# Patient Record
Sex: Female | Born: 1935 | Race: White | Hispanic: No | Smoking: Never smoker
Health system: Southern US, Community
[De-identification: ages and names within clinical notes are randomized; demographics above are authoritative.]

## PROBLEM LIST (undated history)

## (undated) DIAGNOSIS — F0280 Dementia in other diseases classified elsewhere without behavioral disturbance: Secondary | ICD-10-CM

## (undated) DIAGNOSIS — E7800 Pure hypercholesterolemia, unspecified: Secondary | ICD-10-CM

## (undated) DIAGNOSIS — I10 Essential (primary) hypertension: Secondary | ICD-10-CM

## (undated) DIAGNOSIS — G309 Alzheimer's disease, unspecified: Secondary | ICD-10-CM

## (undated) DIAGNOSIS — E0590 Thyrotoxicosis, unspecified without thyrotoxic crisis or storm: Secondary | ICD-10-CM

## (undated) DIAGNOSIS — E119 Type 2 diabetes mellitus without complications: Secondary | ICD-10-CM

## (undated) HISTORY — PX: ABDOMINAL HYSTERECTOMY: SHX81

## (undated) HISTORY — PX: DILATION AND CURETTAGE, DIAGNOSTIC / THERAPEUTIC: SUR384

---

## 2004-05-13 ENCOUNTER — Ambulatory Visit: Payer: Self-pay | Admitting: Family Medicine

## 2005-06-08 ENCOUNTER — Ambulatory Visit: Payer: Self-pay | Admitting: Nurse Practitioner

## 2005-09-23 ENCOUNTER — Ambulatory Visit: Payer: Self-pay | Admitting: Ophthalmology

## 2005-09-29 ENCOUNTER — Ambulatory Visit: Payer: Self-pay | Admitting: Ophthalmology

## 2006-06-23 ENCOUNTER — Ambulatory Visit: Payer: Self-pay | Admitting: Nurse Practitioner

## 2006-11-16 ENCOUNTER — Ambulatory Visit: Payer: Self-pay | Admitting: Family Medicine

## 2007-07-13 ENCOUNTER — Ambulatory Visit: Payer: Self-pay | Admitting: Family Medicine

## 2008-05-08 ENCOUNTER — Ambulatory Visit: Payer: Self-pay | Admitting: Unknown Physician Specialty

## 2008-06-12 ENCOUNTER — Ambulatory Visit: Payer: Self-pay | Admitting: Unknown Physician Specialty

## 2008-07-17 ENCOUNTER — Ambulatory Visit: Payer: Self-pay | Admitting: Family Medicine

## 2008-08-07 ENCOUNTER — Ambulatory Visit: Payer: Self-pay | Admitting: Orthopedic Surgery

## 2008-08-21 ENCOUNTER — Ambulatory Visit: Payer: Self-pay | Admitting: Pain Medicine

## 2008-09-05 ENCOUNTER — Ambulatory Visit: Payer: Self-pay | Admitting: Physician Assistant

## 2008-09-18 ENCOUNTER — Ambulatory Visit: Payer: Self-pay | Admitting: Pain Medicine

## 2008-10-02 ENCOUNTER — Ambulatory Visit: Payer: Self-pay | Admitting: Physician Assistant

## 2009-08-01 ENCOUNTER — Ambulatory Visit: Payer: Self-pay | Admitting: Ophthalmology

## 2009-08-12 ENCOUNTER — Ambulatory Visit: Payer: Self-pay | Admitting: Ophthalmology

## 2009-08-28 ENCOUNTER — Ambulatory Visit: Payer: Self-pay | Admitting: Family Medicine

## 2011-02-02 ENCOUNTER — Ambulatory Visit: Payer: Self-pay | Admitting: Obstetrics & Gynecology

## 2011-02-02 LAB — CBC
HGB: 14.9 g/dL (ref 12.0–16.0)
MCH: 31.1 pg (ref 26.0–34.0)
MCHC: 34 g/dL (ref 32.0–36.0)
Platelet: 189 10*3/uL (ref 150–440)
RDW: 14.1 % (ref 11.5–14.5)

## 2011-02-02 LAB — APTT: Activated PTT: 40.5 secs — ABNORMAL HIGH (ref 23.6–35.9)

## 2011-02-02 LAB — BASIC METABOLIC PANEL
Anion Gap: 14 (ref 7–16)
BUN: 10 mg/dL (ref 7–18)
Calcium, Total: 10.4 mg/dL — ABNORMAL HIGH (ref 8.5–10.1)
Chloride: 96 mmol/L — ABNORMAL LOW (ref 98–107)
Co2: 26 mmol/L (ref 21–32)
Creatinine: 0.86 mg/dL (ref 0.60–1.30)
EGFR (African American): >60
EGFR (Non-African Amer.): >60
Glucose: 152 mg/dL — ABNORMAL HIGH (ref 65–99)
Osmolality: 274 (ref 275–301)
Potassium: 4 mmol/L (ref 3.5–5.1)
Sodium: 136 mmol/L (ref 136–145)

## 2011-02-02 LAB — PROTIME-INR
INR: 1
Prothrombin Time: 13.2 secs (ref 11.5–14.7)

## 2011-02-10 ENCOUNTER — Ambulatory Visit: Payer: Self-pay | Admitting: Obstetrics & Gynecology

## 2011-02-16 LAB — PATHOLOGY REPORT

## 2011-02-17 ENCOUNTER — Ambulatory Visit: Payer: Self-pay | Admitting: Gynecologic Oncology

## 2011-03-13 ENCOUNTER — Ambulatory Visit: Payer: Self-pay | Admitting: Gynecologic Oncology

## 2011-03-31 ENCOUNTER — Ambulatory Visit: Payer: Self-pay | Admitting: Obstetrics & Gynecology

## 2011-03-31 LAB — BASIC METABOLIC PANEL
Calcium, Total: 10.1 mg/dL (ref 8.5–10.1)
Co2: 26 mmol/L (ref 21–32)
Creatinine: 0.77 mg/dL (ref 0.60–1.30)
EGFR (African American): >60
Osmolality: 276 (ref 275–301)
Potassium: 4 mmol/L (ref 3.5–5.1)
Sodium: 136 mmol/L (ref 136–145)

## 2011-03-31 LAB — PROTIME-INR
INR: 1
Prothrombin Time: 13.1 secs (ref 11.5–14.7)

## 2011-03-31 LAB — CBC
HCT: 42.3 % (ref 35.0–47.0)
MCH: 30.4 pg (ref 26.0–34.0)
Platelet: 220 10*3/uL (ref 150–440)
RDW: 13.3 % (ref 11.5–14.5)

## 2011-04-07 ENCOUNTER — Inpatient Hospital Stay: Payer: Self-pay

## 2011-04-08 LAB — CREATININE, SERUM: EGFR (African American): >60

## 2011-04-10 DIAGNOSIS — R000 Tachycardia, unspecified: Secondary | ICD-10-CM

## 2011-04-10 LAB — CBC WITH DIFFERENTIAL/PLATELET
Basophil #: 0.1 10*3/uL (ref 0.0–0.1)
Basophil %: 0.4 %
HCT: 37.9 % (ref 35.0–47.0)
HGB: 12.6 g/dL (ref 12.0–16.0)
MCHC: 33.2 g/dL (ref 32.0–36.0)
Platelet: 234 10*3/uL (ref 150–440)
RBC: 4.17 10*6/uL (ref 3.80–5.20)

## 2011-04-10 LAB — BASIC METABOLIC PANEL
Anion Gap: 10 (ref 7–16)
Calcium, Total: 9.6 mg/dL (ref 8.5–10.1)
Chloride: 97 mmol/L — ABNORMAL LOW (ref 98–107)
Co2: 28 mmol/L (ref 21–32)
EGFR (African American): >60
Potassium: 3.9 mmol/L (ref 3.5–5.1)
Sodium: 135 mmol/L — ABNORMAL LOW (ref 136–145)

## 2011-04-10 LAB — TSH: Thyroid Stimulating Horm: 1.51 u[IU]/mL

## 2011-04-13 ENCOUNTER — Ambulatory Visit: Payer: Self-pay | Admitting: Gynecologic Oncology

## 2011-04-13 LAB — PATHOLOGY REPORT

## 2013-01-09 ENCOUNTER — Emergency Department: Payer: Self-pay | Admitting: Emergency Medicine

## 2013-01-09 LAB — BASIC METABOLIC PANEL
Anion Gap: 9 (ref 7–16)
Calcium, Total: 10.5 mg/dL — ABNORMAL HIGH (ref 8.5–10.1)
Chloride: 96 mmol/L — ABNORMAL LOW (ref 98–107)
Co2: 26 mmol/L (ref 21–32)
Creatinine: 0.76 mg/dL (ref 0.60–1.30)
Osmolality: 273 (ref 275–301)
Potassium: 3.8 mmol/L (ref 3.5–5.1)
Sodium: 131 mmol/L — ABNORMAL LOW (ref 136–145)

## 2013-01-09 LAB — CBC WITH DIFFERENTIAL/PLATELET
Basophil #: 0.1 10*3/uL (ref 0.0–0.1)
Eosinophil #: 0.2 10*3/uL (ref 0.0–0.7)
HCT: 42.5 % (ref 35.0–47.0)
HGB: 14.5 g/dL (ref 12.0–16.0)
Lymphocyte #: 2.5 10*3/uL (ref 1.0–3.6)
Lymphocyte %: 28.9 %
MCH: 31.2 pg (ref 26.0–34.0)
MCV: 91 fL (ref 80–100)
Monocyte #: 0.7 x10 3/mm (ref 0.2–0.9)
Monocyte %: 8.3 %
Platelet: 212 10*3/uL (ref 150–440)

## 2013-01-09 LAB — URINALYSIS, COMPLETE
Bilirubin,UR: NEGATIVE
Ketone: NEGATIVE
Nitrite: NEGATIVE
Specific Gravity: 1.016 (ref 1.003–1.030)
Squamous Epithelial: NONE SEEN

## 2013-01-09 LAB — TROPONIN I: Troponin-I: <0.02 ng/mL

## 2014-05-06 NOTE — Consult Note (Signed)
Brief Consult Note: Diagnosis: Post op  Radical hysterectomy with b/l Salpingo oophrectomy, now with mild hypoxia,post op  Ileus.   Patient was seen by consultant.   Consult note dictated.   Orders entered.   Comments: will get chest xr, abdominal film, check EKG , mild hypoxia could be secondary to atelectasis with abdominal ileus, cont incentive spirometry, start gentle hydration, check labs  DM cont metformin, ss coverage htn on norvasc , atenolol, pt was also on ace at home, will  restart it if renal function stable will follow.  Electronic Signatures: Fredia SorrowGupta, Shanen Norris (MD)  (Signed 29-Mar-13 09:39)  Authored: Brief Consult Note   Last Updated: 29-Mar-13 09:39 by Fredia SorrowGupta, Ingra Rother (MD)

## 2014-05-06 NOTE — Consult Note (Signed)
PATIENT NAME:  Jenna Robinson, Jenna Robinson MR#:  [id number] DATE OF BIRTH:  16-Sep-1925  DATE OF CONSULTATION:  04/10/2011  REFERRING PHYSICIAN:  Dr Tiburcio Pea.   CONSULTING PHYSICIAN:  Fredia Sorrow, MD PRIMARY CARE PHYSICIAN:  Ezzard Standing at [facility].    REASON FOR CONSULTATION: Hypertension, hypoxia, and confusion.   CHIEF COMPLAINT: "My belly is sore."  HISTORY OF PRESENT ILLNESS: An 81 year old female. She is postoperative radical hysterectomy, bilateral salpingo-oophorectomy, and pelvic lymphadenectomy for endometrial carcinoma. She has history of diabetes, hypertension, hyperlipidemia, osteoporosis. She underwent the aforementioned procedure on 04/07/2011. She is postoperative day 3. She did not have a bowel movement, and she is not passing any flatus. She is complaining of some abdominal soreness.  Also she was vomiting this morning as per the notes. She denies any nausea. She denies any chest pain or shortness of breath. She was also noted to be slightly hypoxic this morning. She was 89% on room air and tachycardic. Her heart rate has been jumping from 90 to 120. She has been put on 2L nasal cannula, and she is doing 96% to 988% on 2L nasal cannula. She denies any cough. No pleuritic chest pain. No headache but she is complaining of some dizziness. She denies any urinary complaints. She denies any oliguria. Her diet has been changed to clear liquids this morning.  hospitalist was asked to evaluate the patient regarding mild hypoxia, her elevated blood pressure.  They also put a sitter in right now because she has some mild confusion going on.    PAST MEDICAL HISTORY:  Diabetes, hypertension, hyperlipidemia, chronic insomnia, on trazodone, hypercalcemia.   PAST SURGICAL HISTORY: She had a dilatation and curettage in 1967. Colonoscopy, last one in 2010. She had also been to pain clinic in the past because of her epidural steroid injections.   ALLERGIES TO MEDICATIONS: None.   HOME MEDICATIONS:   1. Alendronate 70 mg once a week.  2. Aspirin 81 mg daily.  3. Atenolol 50 mg daily.   4. Lipitor 40 mg at bedtime.   5. Norvasc 5 mg daily.  6. Tramadol 50 mg 1 to 2 tabs q.6 hours as needed.   7. Trazodone 50 mg 2 to 3 tablets at bedtime as needed.   SOCIAL HISTORY: She lives in [place] by herself. She is quite active. Denies any smoking but she dips tobacco. No alcohol or drug use.   FAMILY HISTORY: She denies any significant medical problems in the family.   REVIEW OF SYSTEMS: She denies any fever, no weakness, no headache, no acute change in vision. She has some dizziness. No cough. She denies any dyspnea, no chest pain, no palpitations. She had nausea, vomiting, some abdominal distention and pain. No GI bleed. She denies any dysuria, oliguria. No thyroid problems. No history of anemia. She has chronic back pain. No focal numbness or weakness. No anxiety.   PHYSICAL EXAMINATION:  VITAL SIGNS: T-max 99.2, heart rate ranging from 90 to 120, respiratory rate 20, blood pressure 142/94 to 165/87, saturating 96% on 2L nasal cannula, 89% on room air.   GENERAL: This is an elderly Caucasian female. She is comfortably sitting in bed, no acute distress.   HEENT: Bilateral pupils are equal. Extraocular muscles intact. No scleral icterus. No conjunctivitis. Oral mucosa is moist. No pallor.   NECK: No thyroid tenderness, enlargement or nodule. Neck is supple. No masses, nontender. No adenopathy. No JVD. No carotid bruit.   CHEST: She has bilateral breath sounds are clear. No adventitious  sounds. Normal respiratory effort. Not using accessory muscles of respiration.   HEART: Heart sounds are regular but tachycardic. Good peripheral pulses. No significant lower extremity edema.   ABDOMEN: Abdomen is postoperative day 3.  It is distended but it is soft. Bowel sounds are hypoactive, no guarding, no rigidity. There is an infraumbilical wound which appears to be clean. No hepatomegaly. No bruit. No  masses.   RECTAL: Deferred.   NEUROLOGIC: She is awake, alert. She is oriented to time, place, and person at this time.  Cranial nerves intact. Moving all extremities against gravity.   EXTREMITIES: No cyanosis. No clubbing.   SKIN: Appears to be dry.    Lab work has been ordered. No recent lab work.  Last hemoglobin was 12.5 on April 08, 2011; and her creatinine was 1.02 on April 08, 2011. Her chest x-ray on March 31, 2011, was essentially negative.   IMPRESSION:  1. Mild hypoxia. This could be atelectasis, may be secondary to postoperative abdominal distention, suspect ileus.  2. Diabetes.  3. Hypertension.  4. Hyperlipidemia.   PLAN: An 81 year old female who has history of diabetes, hypertension, and hyperlipidemia. She is postoperative radical hysterectomy, bilateral salpingo-oophorectomy, and lymphadenectomy. She is postoperative day 3. She was noted to be slightly hypoxic, tachycardic.  this morning. She is also having nausea and vomiting. She is saturating well, 96% to 98% on 2L. Her chest is absolutely clear. She is already on deep vein thrombosis prophylaxis with Lovenox. This could be secondary to some atelectasis secondary to abdominal distention.  I am going to do a chest x-ray on her and EKG on her. She is already using incentive spirometry. We will also get abdominal flat and erect on her.  We will start her on some IV hydration. Check BMP and CBC to monitor her electrolytes. We will also start her on some gentle hydration. Her blood pressure is elevated, and she takes atenolol and Norvasc. She also was taking Zestoretic at home. I am going to restart her lisinopril. For her diabetes she is on metformin and sliding scale coverage.           I am going to continue that for now. If her creatinine is elevated then we can hold the metformin. I am going to continue to follow her labs and x-rays.    Thanks for the consultation. We will follow.    TIME SPENT ON CONSULTATION:   50 minutes.      ____________________________ Fredia SorrowAbhinav Nickoles Gregori, MD ag:vtd D: 04/10/2011 09:35:24 ET T: 04/11/2011 09:07:16 ET JOB#: [id number]  cc: Fredia SorrowAbhinav Novella Abraha, MD, <Dictator> [facility]les [facility] R. Annamarie MajorPaul Harris, MD Fredia SorrowABHINAV Dynesha Woolen MD ELECTRONICALLY SIGNED 05/07/2011 11:40

## 2014-05-06 NOTE — Op Note (Signed)
PATIENT NAME:  Jenna Robinson, Jenna Robinson MR#:  [id number] DATE OF BIRTH:  1925/05/12  DATE OF PROCEDURE:  04/07/2011  PREOPERATIVE DIAGNOSIS: Endometrial carcinoma.   POSTOPERATIVE DIAGNOSIS: Endometrial carcinoma.   PROCEDURES: 1. Radical hysterectomy. 2. Bilateral salpingo-oophorectomy. 3. Pelvic lymphadenectomy.   SURGEON: Dierdre Searles. Paul Kaleen Rochette, MD   CO-SURGEON: Wendy PoetBrigitte Miller, MD    ANESTHESIA: General.   ESTIMATED BLOOD LOSS: 500 mL.   COMPLICATIONS: None.   FINDINGS: Frozen section analysis revealed a 5 cm endometrial carcinoma. It was in the uterus with less than 50% spread through the myometrium.   SPECIMENS: Uterus, right tube and ovary, left tube and ovary, pelvic lymph nodes from both left and right sides.   DISPOSITION: To recovery room in stable condition.   TECHNIQUE: The patient is prepped and draped in the usual sterile fashion after adequate anesthesia is obtained in the supine position on the operating room table. A vertical skin incision is made with a scalpel below the umbilicus to the level of the pubic symphysis. Skin incision is carried down to the level of the rectus fascia which is then dissected superiorly and inferiorly using Metzenbaum scissors. Rectus muscle is separated in the midline. Peritoneum is penetrated and a Bookwalter retractor is then placed with the patient in Trendelenburg positioning. Pelvic washings are obtained. Manual examination is performed of the bowel in its entire length, the omentum, other abdominal organs without any other evidence for spread of any cancer.   The uterus is grasped at the cornua on both sides with a long curved Kelly clamp. The round ligaments are suture ligated and then incised using Bovie electrocautery. Anterior posterior leaves of the broad ligament are carefully dissected including the serosa over the lower uterine segment and cervix. The infundibulopelvic blood vessels and ligaments are carefully clamped, transected, and suture  ligated for complete amputation of uterus with its adnexa. Dissection is carried down to the level of the uterine arteries which are also clamped, transected, and suture ligated. Sequential clamping down to the level of the external os is performed at which point the complete specimen is amputated and removed. Clamps are placed across the vaginal cuff and suture ligated until complete closure of the vaginal cuff ensues. Irrigation of the pelvic cavity is performed with aspiration of all fluid and any remaining blood clot. Excellent hemostasis is noted.   Dr. Wendy PoetBrigitte Miller will dictate a separate note about pelvic lymphadenectomy.   Once adequate hemostasis is assured, then retractors are removed and sponge count is correct at this time. The rectus fascia is closed with a PDS suture in a running fashion. Subcutaneous tissues are irrigated and hemostasis is assured using electrocautery. Skin is closed with surgical clips. A sterile bandage is applied. The patient goes to the recovery room in stable condition. All sponge, instrument, and needle counts are correct.  ____________________________ R. Annamarie MajorPaul Brynne Doane, MD rph:drc D: 04/07/2011 15:08:37 ET T: 04/07/2011 15:32:26 ET JOB#: [id number]  cc: Dierdre Searles. Paul Kenosha Doster, MD, <Dictator> Nadara MustardOBERT P Lawrence Mitch MD ELECTRONICALLY SIGNED 04/07/2011 15:48

## 2014-05-06 NOTE — Op Note (Signed)
PATIENT NAME:  Jenna GasserCATES, Carla V MR#:  [id number] DATE OF BIRTH:  12/20/1925  DATE OF PROCEDURE:  04/07/2011  PREOPERATIVE DIAGNOSIS: Adenocarcinoma of the endometrium.   POSTOPERATIVE DIAGNOSIS: Adenocarcinoma of the endometrium stage I-A.   PROCEDURES PERFORMED:  1. Exploratory laparotomy.  2. Total abdominal hysterectomy. 3. Bilateral salpingo-oophorectomy. 4. Pelvic lymphadenectomy.   CO-SURGEON: Maxine GlennBrigitte E. Beverly Suriano, MD/Paul Tiburcio PeaHarris, MD    ANESTHESIA: General.   COMPLICATIONS: None.   INDICATION FOR SURGERY: Ms. [name] is an 81 year old patient who presented with postmenopausal bleeding and on endometrial biopsy was noted to have a well-differentiated endometrioid adenocarcinoma. Therefore, decision was made to proceed with surgery.   FINDINGS AT TIME OF SURGERY: Uterus slightly enlarged probably related to a fibroid. Hard tumor consistent with fibroma on the left ovary about 4 cm in diameter. Adnexal adhesions in that area. Normal adnexa on the right side. No peritoneal lesions in the pelvis and upper abdomen. No evidence of adenopathy.   Frozen section reveals a 5 cm tumor without definite evidence of invasion.   OPERATIVE REPORT: After adequate general anesthesia had been obtained, the patient was prepped and draped in supine position. A midline incision was placed with a sharp knife and carried down through the fascia. The peritoneum was identified and entered. Incision was extended cephalad and caudad. Exploration was performed with the above-mentioned findings. Peritoneal cytology was obtained. A Bookwalter retractor was placed and the bowel was packed away with lap sponges.   The hysterectomy is dictated by Dr. Tiburcio PeaHarris.   The lymphadenectomy was done in a similar fashion on either pelvic sidewall. Using the Harmonic scalpel, the node bearing fatty tissue around external iliac vessels, hypogastric vessels, and from the obturator space were removed. Hemostasis was noted to be adequate  at the end of the procedure. Ureter vessels and obturator nerve were kept under constant visualization. As soon as frozen section became available, no further procedure was deemed indicated.   Irrigation was performed and adequate hemostasis confirmed in all areas. Arista was placed over the areas of dissection. Lap sponges and retractor were removed. The bowel was allowed to fall back into the pelvic cavity. The fascia was closed with a #1 loop PDS suture. Irrigation of the subcutaneous tissue was performed and adequate hemostasis confirmed before the skin was closed with staples.   The patient tolerated the procedure well and was taken to the recovery room in stable condition. Postoperative urine was clear. Pad, sponge, needle, and instrument counts were correct x2.   ____________________________ Maxine GlennBrigitte E. Rosene Pilling, MD bem:drc D: 04/07/2011 15:27:02 ET T: 04/07/2011 15:45:18 ET JOB#: [id number]  cc: Maxine GlennBrigitte E. Irbin Fines, MD, <Dictator> Maxine GlennBRIGITTE E Franziska Podgurski MD ELECTRONICALLY SIGNED 04/14/2011 12:50

## 2014-05-06 NOTE — Op Note (Signed)
PATIENT NAME:  Jenna GasserCATES, Shakya V MR#:  [id number] DATE OF BIRTH:  1925/06/13  DATE OF PROCEDURE:  02/10/2011  PREOPERATIVE DIAGNOSIS:  1. Postmenopausal bleeding.  2. Endometrial abnormality.   POSTOPERATIVE DIAGNOSIS:  1. Postmenopausal bleeding. 2. Endometrial abnormality.  PROCEDURE:  1. Hysteroscopy.  2. Dilation and curettage.   SURGEON: Annamarie MajorPaul Naomie Crow, MD   ANESTHESIA: General.   ESTIMATED BLOOD LOSS: 100 mL.   COMPLICATIONS: None.   FINDINGS: It was difficult to visualize the endometrial cavity due to a bleeding intrauterine mass. The uterus did sound to 9 cm.   DISPOSITION: To the recovery room in stable condition.   TECHNIQUE: The patient is prepped and draped in the usual sterile fashion after adequate anesthesia is obtained in the dorsal lithotomy position. The bladder is drained with a Robinson catheter. A tenaculum is placed on the anterior and posterior lips of the cervix. Lacrimal duct probes are used to initially identify the cervical os and start the dilation process, followed by the use of Hegar dilators. Once adequate dilation is performed, the uterus is sounded to 9 cm. A 30-degree hysteroscope is inserted with distention of the intrauterine cavity using glycine fluid. It was very difficult to visualize anything within the intrauterine cavity, although it was very certain that there was no perforation and the intrauterine cavity was visualized. The polyp or mass within the uterus was bleeding, which was obscuring the field. Concern for adenocarcinoma was entertained. The hysteroscope was removed with minimal discrepancy of glycine fluid. A gentle curettage and grasping forceps was used to extract some tissue for pathology diagnosis. There was some bleeding noted at this point, still, and further gentle curettage was performed with a banjo curette to help to obtain hemostasis. Once adequate hemostasis was visualized, then the tenacula are removed. The patient goes to the  recovery room in stable condition. All sponge, instrument, and needle counts are correct.   ____________________________ R. Annamarie MajorPaul Jacorion Klem, MD rph:cbb D: 02/10/2011 14:51:17 ET T: 02/10/2011 15:26:12 ET JOB#: [id number]  cc: Dierdre Searles. Paul Tatelyn Vanhecke, MD, <Dictator> Nadara MustardOBERT P Jennifermarie Franzen MD ELECTRONICALLY SIGNED 02/16/2011 9:25

## 2014-05-06 NOTE — Consult Note (Signed)
PATIENT NAME:  Jenna Robinson, Jenna Robinson MR#:  [id number] DATE OF BIRTH:  06-27-1925  DATE OF CONSULTATION:  04/10/2011  REFERRING PHYSICIAN:   CONSULTING PHYSICIAN:  Fredia SorrowAbhinav Airelle Everding, MD  ADDENDUM:   Her EKG shows that the patient has atrial fibrillation with RVR in the range of 160s. She denies any palpitations or chest pain at this time. I am going to start her on p.o. Cardizem, put her on off-unit telemetry. If off-unit telemetry is not possible in the GYN unit we can move her to 2A for continuous telemetry monitoring. Reviewed her labs.  Her white count is slightly elevated. This could be secondary to ileus.  Her creatinine is stable. We will give her some gentle IV hydration. Her TSH is normal.    ____________________________ Fredia SorrowAbhinav Querida Beretta, MD ag:vtd D: 04/10/2011 10:41:35 ET T: 04/11/2011 09:32:16 ET JOB#: [id number]  cc: Fredia SorrowAbhinav Lavren Lewan, MD, <Dictator> Fredia SorrowABHINAV Mekala Winger MD ELECTRONICALLY SIGNED 05/07/2011 11:40

## 2014-06-06 ENCOUNTER — Other Ambulatory Visit: Payer: Self-pay | Admitting: Orthopedic Surgery

## 2014-06-06 DIAGNOSIS — M5441 Lumbago with sciatica, right side: Secondary | ICD-10-CM

## 2014-06-14 ENCOUNTER — Ambulatory Visit: Payer: PRIVATE HEALTH INSURANCE

## 2014-06-14 ENCOUNTER — Ambulatory Visit
Admission: RE | Admit: 2014-06-14 | Discharge: 2014-06-14 | Disposition: A | Discharge status: Home / Self Care | Payer: Medicare Other | Source: Ambulatory Visit | Attending: Orthopedic Surgery | Admitting: Orthopedic Surgery

## 2014-06-14 DIAGNOSIS — M5441 Lumbago with sciatica, right side: Secondary | ICD-10-CM

## 2014-06-14 DIAGNOSIS — M545 Low back pain: Secondary | ICD-10-CM | POA: Diagnosis present

## 2014-06-14 DIAGNOSIS — M5386 Other specified dorsopathies, lumbar region: Secondary | ICD-10-CM | POA: Diagnosis not present

## 2014-06-14 DIAGNOSIS — M519 Unspecified thoracic, thoracolumbar and lumbosacral intervertebral disc disorder: Secondary | ICD-10-CM | POA: Insufficient documentation

## 2014-06-14 DIAGNOSIS — M4186 Other forms of scoliosis, lumbar region: Secondary | ICD-10-CM | POA: Diagnosis not present

## 2014-06-14 DIAGNOSIS — M47896 Other spondylosis, lumbar region: Secondary | ICD-10-CM | POA: Diagnosis not present

## 2014-06-14 DIAGNOSIS — M5416 Radiculopathy, lumbar region: Secondary | ICD-10-CM | POA: Diagnosis present

## 2015-01-01 ENCOUNTER — Emergency Department: Payer: Medicare Other

## 2015-01-01 ENCOUNTER — Encounter: Payer: Self-pay | Admitting: Emergency Medicine

## 2015-01-01 ENCOUNTER — Inpatient Hospital Stay
Admission: EM | Admit: 2015-01-01 | Discharge: 2015-01-05 | DRG: 558 | Disposition: A | Discharge status: Skilled Nursing Facility | Payer: Medicare Other | Attending: Internal Medicine | Admitting: Internal Medicine

## 2015-01-01 DIAGNOSIS — S50311A Abrasion of right elbow, initial encounter: Secondary | ICD-10-CM | POA: Diagnosis present

## 2015-01-01 DIAGNOSIS — M545 Low back pain: Secondary | ICD-10-CM | POA: Diagnosis present

## 2015-01-01 DIAGNOSIS — E876 Hypokalemia: Secondary | ICD-10-CM | POA: Diagnosis present

## 2015-01-01 DIAGNOSIS — I10 Essential (primary) hypertension: Secondary | ICD-10-CM | POA: Diagnosis present

## 2015-01-01 DIAGNOSIS — F0390 Unspecified dementia without behavioral disturbance: Secondary | ICD-10-CM | POA: Diagnosis present

## 2015-01-01 DIAGNOSIS — S0101XA Laceration without foreign body of scalp, initial encounter: Secondary | ICD-10-CM | POA: Diagnosis present

## 2015-01-01 DIAGNOSIS — M6282 Rhabdomyolysis: Secondary | ICD-10-CM | POA: Diagnosis not present

## 2015-01-01 DIAGNOSIS — Z794 Long term (current) use of insulin: Secondary | ICD-10-CM

## 2015-01-01 DIAGNOSIS — R778 Other specified abnormalities of plasma proteins: Secondary | ICD-10-CM | POA: Diagnosis present

## 2015-01-01 DIAGNOSIS — M5410 Radiculopathy, site unspecified: Secondary | ICD-10-CM | POA: Diagnosis present

## 2015-01-01 DIAGNOSIS — Z79899 Other long term (current) drug therapy: Secondary | ICD-10-CM

## 2015-01-01 DIAGNOSIS — E119 Type 2 diabetes mellitus without complications: Secondary | ICD-10-CM | POA: Diagnosis present

## 2015-01-01 DIAGNOSIS — D72829 Elevated white blood cell count, unspecified: Secondary | ICD-10-CM | POA: Diagnosis present

## 2015-01-01 DIAGNOSIS — R3129 Other microscopic hematuria: Secondary | ICD-10-CM | POA: Diagnosis present

## 2015-01-01 DIAGNOSIS — G8929 Other chronic pain: Secondary | ICD-10-CM | POA: Diagnosis present

## 2015-01-01 DIAGNOSIS — S0003XA Contusion of scalp, initial encounter: Secondary | ICD-10-CM | POA: Diagnosis present

## 2015-01-01 DIAGNOSIS — R7989 Other specified abnormal findings of blood chemistry: Secondary | ICD-10-CM

## 2015-01-01 DIAGNOSIS — W1839XA Other fall on same level, initial encounter: Secondary | ICD-10-CM | POA: Diagnosis present

## 2015-01-01 DIAGNOSIS — R531 Weakness: Secondary | ICD-10-CM

## 2015-01-01 DIAGNOSIS — R821 Myoglobinuria: Secondary | ICD-10-CM | POA: Diagnosis present

## 2015-01-01 DIAGNOSIS — R748 Abnormal levels of other serum enzymes: Secondary | ICD-10-CM | POA: Diagnosis present

## 2015-01-01 DIAGNOSIS — E785 Hyperlipidemia, unspecified: Secondary | ICD-10-CM | POA: Diagnosis present

## 2015-01-01 DIAGNOSIS — Y92000 Kitchen of unspecified non-institutional (private) residence as  the place of occurrence of the external cause: Secondary | ICD-10-CM

## 2015-01-01 HISTORY — DX: Type 2 diabetes mellitus without complications: E11.9

## 2015-01-01 LAB — URINALYSIS COMPLETE WITH MICROSCOPIC (ARMC ONLY)
BILIRUBIN URINE: NEGATIVE
Bacteria, UA: NONE SEEN
GLUCOSE, UA: 50 mg/dL — AB
Leukocytes, UA: NEGATIVE
Nitrite: NEGATIVE
Protein, ur: NEGATIVE mg/dL
SQUAMOUS EPITHELIAL / LPF: NONE SEEN
Specific Gravity, Urine: 1.004 — ABNORMAL LOW (ref 1.005–1.030)
WBC, UA: NONE SEEN WBC/hpf (ref 0–5)
pH: 5 (ref 5.0–8.0)

## 2015-01-01 LAB — BASIC METABOLIC PANEL
ANION GAP: 16 — AB (ref 5–15)
BUN: 16 mg/dL (ref 6–20)
CALCIUM: 11.2 mg/dL — AB (ref 8.9–10.3)
CO2: 24 mmol/L (ref 22–32)
CREATININE: 0.84 mg/dL (ref 0.44–1.00)
Chloride: 94 mmol/L — ABNORMAL LOW (ref 101–111)
GFR calc Af Amer: >60 mL/min (ref >60)
Glucose, Bld: 157 mg/dL — ABNORMAL HIGH (ref 65–99)
Potassium: 3.8 mmol/L (ref 3.5–5.1)
Sodium: 134 mmol/L — ABNORMAL LOW (ref 135–145)

## 2015-01-01 LAB — TROPONIN I: TROPONIN I: 0.17 ng/mL — AB (ref <0.031)

## 2015-01-01 LAB — CK: CK TOTAL: 1015 U/L — AB (ref 38–234)

## 2015-01-01 LAB — CBC WITH DIFFERENTIAL/PLATELET
BASOS PCT: 0 %
Basophils Absolute: 0 10*3/uL (ref 0–0.1)
EOS ABS: 0 10*3/uL (ref 0–0.7)
Eosinophils Relative: 0 %
HEMATOCRIT: 42.3 % (ref 35.0–47.0)
Hemoglobin: 13.7 g/dL (ref 12.0–16.0)
Lymphocytes Relative: 3 %
Lymphs Abs: 0.5 10*3/uL — ABNORMAL LOW (ref 1.0–3.6)
MCH: 29.3 pg (ref 26.0–34.0)
MCHC: 32.5 g/dL (ref 32.0–36.0)
MCV: 90.1 fL (ref 80.0–100.0)
MONO ABS: 1.7 10*3/uL — AB (ref 0.2–0.9)
Monocytes Relative: 9 %
NEUTROS ABS: 15.9 10*3/uL — AB (ref 1.4–6.5)
Neutrophils Relative %: 88 %
Platelets: 219 10*3/uL (ref 150–440)
RBC: 4.69 MIL/uL (ref 3.80–5.20)
RDW: 13.2 % (ref 11.5–14.5)
WBC: 18.1 10*3/uL — ABNORMAL HIGH (ref 3.6–11.0)

## 2015-01-01 MED ORDER — SODIUM CHLORIDE 0.9 % IV BOLUS (SEPSIS)
500.0000 mL | Freq: Once | INTRAVENOUS | Status: AC
Start: 2015-01-01 — End: 2015-01-02
  Administered 2015-01-01: 500 mL via INTRAVENOUS

## 2015-01-01 NOTE — ED Notes (Signed)
MD at bedside. 

## 2015-01-01 NOTE — ED Notes (Signed)
Patient transported to CT 

## 2015-01-01 NOTE — ED Notes (Signed)
Patient transported to X-ray 

## 2015-01-01 NOTE — ED Notes (Signed)
Elevated trop levels 0.17, MD notified

## 2015-01-01 NOTE — ED Notes (Signed)
In and out cath done.  speciman to lab.  Tolerated well.  Family at bedside.

## 2015-01-01 NOTE — ED Notes (Addendum)
Pt from home via EMS for fall, was down since aprox 4pm today, denies LOC, c/o left side hip pain. Pt has lac to right side of head, bleeding controlled.

## 2015-01-01 NOTE — ED Notes (Addendum)
Pt comes from home for fall today, aprox down since 4pm, Pt c/o right hip pain, denies LOC, CP or SOB. Pt has lac to right side of head and abrasion to right elbow. Pt A&O

## 2015-01-02 ENCOUNTER — Observation Stay (HOSPITAL_BASED_OUTPATIENT_CLINIC_OR_DEPARTMENT_OTHER)
Admit: 2015-01-02 | Discharge: 2015-01-02 | Disposition: A | Discharge status: Home / Self Care | Payer: Medicare Other | Attending: Internal Medicine | Admitting: Internal Medicine

## 2015-01-02 ENCOUNTER — Encounter: Payer: Self-pay | Admitting: Internal Medicine

## 2015-01-02 DIAGNOSIS — R748 Abnormal levels of other serum enzymes: Secondary | ICD-10-CM | POA: Diagnosis present

## 2015-01-02 DIAGNOSIS — S0003XA Contusion of scalp, initial encounter: Secondary | ICD-10-CM | POA: Diagnosis present

## 2015-01-02 DIAGNOSIS — R7989 Other specified abnormal findings of blood chemistry: Secondary | ICD-10-CM | POA: Diagnosis not present

## 2015-01-02 DIAGNOSIS — W1839XA Other fall on same level, initial encounter: Secondary | ICD-10-CM | POA: Diagnosis present

## 2015-01-02 DIAGNOSIS — Z794 Long term (current) use of insulin: Secondary | ICD-10-CM | POA: Diagnosis not present

## 2015-01-02 DIAGNOSIS — I10 Essential (primary) hypertension: Secondary | ICD-10-CM | POA: Diagnosis present

## 2015-01-02 DIAGNOSIS — M6282 Rhabdomyolysis: Secondary | ICD-10-CM | POA: Diagnosis present

## 2015-01-02 DIAGNOSIS — G8929 Other chronic pain: Secondary | ICD-10-CM | POA: Diagnosis present

## 2015-01-02 DIAGNOSIS — S0101XA Laceration without foreign body of scalp, initial encounter: Secondary | ICD-10-CM | POA: Diagnosis present

## 2015-01-02 DIAGNOSIS — R821 Myoglobinuria: Secondary | ICD-10-CM | POA: Diagnosis present

## 2015-01-02 DIAGNOSIS — S50311A Abrasion of right elbow, initial encounter: Secondary | ICD-10-CM | POA: Diagnosis present

## 2015-01-02 DIAGNOSIS — Z79899 Other long term (current) drug therapy: Secondary | ICD-10-CM | POA: Diagnosis not present

## 2015-01-02 DIAGNOSIS — Y92000 Kitchen of unspecified non-institutional (private) residence as  the place of occurrence of the external cause: Secondary | ICD-10-CM | POA: Diagnosis not present

## 2015-01-02 DIAGNOSIS — E785 Hyperlipidemia, unspecified: Secondary | ICD-10-CM | POA: Diagnosis present

## 2015-01-02 DIAGNOSIS — E876 Hypokalemia: Secondary | ICD-10-CM | POA: Diagnosis present

## 2015-01-02 DIAGNOSIS — M5410 Radiculopathy, site unspecified: Secondary | ICD-10-CM | POA: Diagnosis present

## 2015-01-02 DIAGNOSIS — F0390 Unspecified dementia without behavioral disturbance: Secondary | ICD-10-CM | POA: Diagnosis present

## 2015-01-02 DIAGNOSIS — R778 Other specified abnormalities of plasma proteins: Secondary | ICD-10-CM | POA: Diagnosis present

## 2015-01-02 DIAGNOSIS — D72829 Elevated white blood cell count, unspecified: Secondary | ICD-10-CM | POA: Diagnosis present

## 2015-01-02 DIAGNOSIS — M545 Low back pain: Secondary | ICD-10-CM | POA: Diagnosis present

## 2015-01-02 DIAGNOSIS — R3129 Other microscopic hematuria: Secondary | ICD-10-CM | POA: Diagnosis present

## 2015-01-02 DIAGNOSIS — R531 Weakness: Secondary | ICD-10-CM | POA: Diagnosis present

## 2015-01-02 DIAGNOSIS — E119 Type 2 diabetes mellitus without complications: Secondary | ICD-10-CM | POA: Diagnosis present

## 2015-01-02 LAB — GLUCOSE, CAPILLARY
Glucose-Capillary: 182 mg/dL — ABNORMAL HIGH (ref 65–99)
Glucose-Capillary: 131 mg/dL — ABNORMAL HIGH (ref 65–99)
Glucose-Capillary: 231 mg/dL — ABNORMAL HIGH (ref 65–99)
Glucose-Capillary: 165 mg/dL — ABNORMAL HIGH (ref 65–99)
Glucose-Capillary: 179 mg/dL — ABNORMAL HIGH (ref 65–99)

## 2015-01-02 LAB — HEMOGLOBIN A1C: Hgb A1c MFr Bld: 5.9 % (ref 4.0–6.0)

## 2015-01-02 LAB — TROPONIN I
Troponin I: 0.68 ng/mL — ABNORMAL HIGH (ref <0.031)
Troponin I: 0.71 ng/mL — ABNORMAL HIGH (ref <0.031)
Troponin I: 0.57 ng/mL — ABNORMAL HIGH (ref <0.031)

## 2015-01-02 MED ORDER — LIDOCAINE-EPINEPHRINE-TETRACAINE (LET) SOLUTION
NASAL | Status: AC
Start: 2015-01-02 — End: 2015-01-02
  Administered 2015-01-02: 3 mL
  Filled 2015-01-02: qty 3

## 2015-01-02 MED ORDER — HEPARIN SODIUM (PORCINE) 5000 UNIT/ML IJ SOLN
5000.0000 [IU] | Freq: Three times a day (TID) | INTRAMUSCULAR | Status: DC
  Administered 2015-01-02 – 2015-01-05 (×9): 5000 [IU] via SUBCUTANEOUS
  Filled 2015-01-02 (×9): qty 1

## 2015-01-02 MED ORDER — ACETAMINOPHEN 325 MG PO TABS
650.0000 mg | ORAL_TABLET | Freq: Four times a day (QID) | ORAL | Status: DC | PRN
  Administered 2015-01-02 – 2015-01-03 (×2): 650 mg via ORAL
  Filled 2015-01-02 (×2): qty 2

## 2015-01-02 MED ORDER — ONDANSETRON HCL 4 MG/2ML IJ SOLN: 4.0000 mg | Freq: Four times a day (QID) | INTRAMUSCULAR | Status: DC | PRN

## 2015-01-02 MED ORDER — CALCIUM CARBONATE-VITAMIN D 500-200 MG-UNIT PO TABS
1.0000 | ORAL_TABLET | Freq: Two times a day (BID) | ORAL | Status: DC
  Administered 2015-01-02 – 2015-01-05 (×7): 1 via ORAL
  Filled 2015-01-02 (×7): qty 1

## 2015-01-02 MED ORDER — INSULIN DETEMIR 100 UNIT/ML ~~LOC~~ SOLN
16.0000 [IU] | Freq: Every day | SUBCUTANEOUS | Status: DC
  Administered 2015-01-02 – 2015-01-04 (×4): 16 [IU] via SUBCUTANEOUS
  Filled 2015-01-02 (×5): qty 0.16

## 2015-01-02 MED ORDER — MAGNESIUM OXIDE 400 (241.3 MG) MG PO TABS
400.0000 mg | ORAL_TABLET | Freq: Two times a day (BID) | ORAL | Status: DC
  Administered 2015-01-02 – 2015-01-05 (×8): 400 mg via ORAL
  Filled 2015-01-02 (×8): qty 1

## 2015-01-02 MED ORDER — SODIUM CHLORIDE 0.9 % IV SOLN
INTRAVENOUS | Status: DC
  Administered 2015-01-02 (×3): via INTRAVENOUS

## 2015-01-02 MED ORDER — DOCUSATE SODIUM 100 MG PO CAPS
100.0000 mg | ORAL_CAPSULE | Freq: Two times a day (BID) | ORAL | Status: DC
  Administered 2015-01-02 – 2015-01-05 (×8): 100 mg via ORAL
  Filled 2015-01-02 (×8): qty 1

## 2015-01-02 MED ORDER — ATORVASTATIN CALCIUM 20 MG PO TABS
40.0000 mg | ORAL_TABLET | Freq: Every day | ORAL | Status: DC
Start: 2015-01-02 — End: 2015-01-04
  Administered 2015-01-02 – 2015-01-03 (×2): 40 mg via ORAL
  Filled 2015-01-02 (×2): qty 2

## 2015-01-02 MED ORDER — INSULIN ASPART 100 UNIT/ML ~~LOC~~ SOLN
0.0000 [IU] | Freq: Three times a day (TID) | SUBCUTANEOUS | Status: DC
  Administered 2015-01-02: 2 [IU] via SUBCUTANEOUS
  Administered 2015-01-02: 3 [IU] via SUBCUTANEOUS
  Administered 2015-01-03: 1 [IU] via SUBCUTANEOUS
  Administered 2015-01-03: 3 [IU] via SUBCUTANEOUS
  Administered 2015-01-04 (×3): 2 [IU] via SUBCUTANEOUS
  Administered 2015-01-05: 1 [IU] via SUBCUTANEOUS
  Administered 2015-01-05: 3 [IU] via SUBCUTANEOUS
  Filled 2015-01-02: qty 2
  Filled 2015-01-02: qty 1
  Filled 2015-01-02 (×2): qty 2
  Filled 2015-01-02: qty 3
  Filled 2015-01-02: qty 2
  Filled 2015-01-02: qty 1
  Filled 2015-01-02 (×2): qty 3

## 2015-01-02 MED ORDER — SODIUM CHLORIDE 0.9 % IJ SOLN
3.0000 mL | Freq: Two times a day (BID) | INTRAMUSCULAR | Status: DC
  Administered 2015-01-02 – 2015-01-03 (×4): 3 mL via INTRAVENOUS

## 2015-01-02 MED ORDER — ACETAMINOPHEN 650 MG RE SUPP
650.0000 mg | Freq: Four times a day (QID) | RECTAL | Status: DC | PRN
Start: 2015-01-02 — End: 2015-01-05

## 2015-01-02 MED ORDER — ONDANSETRON HCL 4 MG PO TABS: 4.0000 mg | ORAL_TABLET | Freq: Four times a day (QID) | ORAL | Status: DC | PRN

## 2015-01-02 MED ORDER — ATENOLOL 50 MG PO TABS
75.0000 mg | ORAL_TABLET | Freq: Every day | ORAL | Status: DC
  Administered 2015-01-02 – 2015-01-05 (×4): 75 mg via ORAL
  Filled 2015-01-02: qty 3
  Filled 2015-01-02: qty 1
  Filled 2015-01-02: qty 3
  Filled 2015-01-02: qty 1
  Filled 2015-01-02: qty 3

## 2015-01-02 MED ORDER — MORPHINE SULFATE (PF) 2 MG/ML IV SOLN: 1.0000 mg | INTRAVENOUS | Status: DC | PRN

## 2015-01-02 MED ORDER — LISINOPRIL 10 MG PO TABS
10.0000 mg | ORAL_TABLET | Freq: Every day | ORAL | Status: DC
  Administered 2015-01-02 – 2015-01-05 (×4): 10 mg via ORAL
  Filled 2015-01-02 (×4): qty 1

## 2015-01-02 NOTE — Progress Notes (Signed)
*  PRELIMINARY RESULTS* Echocardiogram 2D Echocardiogram has been performed.  Georgann HousekeeperJerry R Hege 01/02/2015, 2:20 PM

## 2015-01-02 NOTE — ED Notes (Signed)
Admitting MD at bedside.

## 2015-01-02 NOTE — Progress Notes (Signed)
[facility] [place] at [facility]   PATIENT NAME: Jenna PopBarbara Robinson    MR#:  [id number]  DATE OF BIRTH:  1925-05-20  SUBJECTIVE:  CHIEF COMPLAINT:   Chief Complaint  Patient presents with  . Fall   - admitted after fall and scalp laceration - elevated troponin- likely from rhabdo,  - no chest pain  REVIEW OF SYSTEMS:  Review of Systems  Constitutional: Negative for fever and chills.  HENT: Negative for ear discharge, ear pain and nosebleeds.   Eyes: Negative for blurred vision.  Respiratory: Negative for cough, shortness of breath and wheezing.   Cardiovascular: Negative for chest pain, palpitations and leg swelling.  Gastrointestinal: Negative for nausea, vomiting, abdominal pain, diarrhea and constipation.  Genitourinary: Negative for dysuria.  Musculoskeletal: Negative for myalgias.  Skin: Negative for rash.       Laceration on back of head with staples  Neurological: Negative for dizziness, sensory change, speech change, focal weakness, seizures, weakness and headaches.  Psychiatric/Behavioral: Negative for depression.    DRUG ALLERGIES:  No Known Allergies  VITALS:  Blood pressure 128/68, pulse 70, temperature 98.7 F (37.1 C), temperature source Oral, resp. rate 18, height 5\' 11"  (1.803 m), weight 73.664 kg (162 lb 6.4 oz), SpO2 96 %.  PHYSICAL EXAMINATION:  Physical Exam  GENERAL:  81 y.o.-year-old patient sitting in the chair, with no acute distress.  EYES: Pupils equal, round, reactive to light and accommodation. No scleral icterus. Extraocular muscles intact.  HEENT: Head atraumatic, normocephalic. Oropharynx and nasopharynx clear.  NECK:  Supple, no jugular venous distention. No thyroid enlargement, no tenderness.  LUNGS: Normal breath sounds bilaterally, no wheezing, rales,rhonchi or crepitation. No use of accessory muscles of respiration.  CARDIOVASCULAR: S1, S2 normal. No  rubs, or gallops. 3/6 systolic murmur present ABDOMEN:  Soft, nontender, nondistended. Bowel sounds present. No organomegaly or mass.  EXTREMITIES: No pedal edema, cyanosis, or clubbing.  NEUROLOGIC: Cranial nerves II through XII are intact. Muscle strength 5/5 in all extremities. Sensation intact. Gait not checked.  PSYCHIATRIC: The patient is alert and oriented x 1-2.  SKIN: No obvious rash, lesion, or ulcer.    LABORATORY PANEL:   CBC  Recent Labs Lab 01/01/15 2018  WBC 18.1*  HGB 13.7  HCT 42.3  PLT 219   ------------------------------------------------------------------------------------------------------------------  Chemistries   Recent Labs Lab 01/01/15 2018  NA 134*  K 3.8  CL 94*  CO2 24  GLUCOSE 157*  BUN 16  CREATININE 0.84  CALCIUM 11.2*   ------------------------------------------------------------------------------------------------------------------  Cardiac Enzymes  Recent Labs Lab 01/02/15 1352  TROPONINI 0.57*   ------------------------------------------------------------------------------------------------------------------  RADIOLOGY:  Dg Chest 2 View  01/01/2015  CLINICAL DATA:  Syncope EXAM: CHEST  2 VIEW COMPARISON:  04/10/2011 FINDINGS: Normal heart size and mediastinal contours. No acute infiltrate or edema. No effusion or pneumothorax. No acute osseous findings. IMPRESSION: No active cardiopulmonary disease or change from 2013. Electronically Signed   By: Marnee SpringJonathon  Watts M.D.   On: 01/01/2015 22:56   Ct Head Wo Contrast  01/01/2015  CLINICAL DATA:  81 year old female with head trauma EXAM: CT HEAD WITHOUT CONTRAST TECHNIQUE: Contiguous axial images were obtained from the base of the skull through the vertex without intravenous contrast. COMPARISON:  CT dated 01/10/13 FINDINGS: The ventricles are dilated and the sulci are prominent compatible with age-related atrophy. Periventricular and deep white matter hypodensities represent chronic microvascular ischemic changes. Small old left basal  ganglia lacunar infarct. There is no intracranial hemorrhage. No mass effect or midline shift  identified. The visualized paranasal sinuses and mastoid air cells are well aerated. The calvarium is intact. Right posterior occipital scalp hematoma. IMPRESSION: No acute intracranial hemorrhage. Age-related atrophy and chronic microvascular ischemic disease. Electronically Signed   By: Elgie Collard M.D.   On: 01/01/2015 20:35   Dg Hip Unilat With Pelvis 2-3 Views Right  01/01/2015  CLINICAL DATA:  Larey Seat today.  Right hip pain. EXAM: DG HIP (WITH OR WITHOUT PELVIS) 2-3V RIGHT COMPARISON:  None. FINDINGS: Both hips are normally located. Mild degenerative changes. No acute hip fracture. The pubic symphysis and SI joints are intact. No pelvic fractures. Scoliosis and degenerative changes noted in the lower lumbar spine. IMPRESSION: No acute hip fracture. Electronically Signed   By: Rudie Meyer M.D.   On: 01/01/2015 20:49    EKG:   Orders placed or performed during the hospital encounter of 01/01/15  . ED EKG  . ED EKG  . EKG 12-Lead  . 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  #1 fall and scalp laceration-patient states she had weakness in her legs suddenly, the gave out and she had the fall., Denies any chest pain or loss of consciousness. She did hit her head and has a laceration that has been stapled. -CT of the head does not reveal any intracranial bleed. -Physical therapy consult did and they have recommended rehabilitation.  #2 rhabdomyolysis-secondary to the fall. Gentle hydration and recheck CPK tomorrow a.m.  #3 hematuria-actually her urine analysis reveals 2+ hemoglobin but less than 5 RBCs.  -that could be secondary to myoglobinuria from  her rhabdo -No hematuria in the hospital he here. Further workup as outpatient if this recurs  #4 elevated troponin-no chest pain, no prior cardiac history. -Could be fromrhabdo, check an ECHO. If there is no wall motion abnormality, will not consult cardiology. -Troponins have plateaued.  #5 hypertension-continue atenolol and lisinopril  #6 diabetes mellitus-on sliding scale insulin. Also on Levemir.  #7 DVT prophylaxis-on subcutaneous heparin  #8 leukocytosis-likely stress reaction. Urine with no evidence of infection -Chest x-ray normal. -Recheck in a.m.  Possible discharge tomorrow   All the records are reviewed and case discussed with Care Management/Social Workerr. Management plans discussed with the patient, family and they are in agreement.  CODE STATUS: Full Code  TOTAL TIME TAKING CARE OF THIS PATIENT: 38 minutes.   POSSIBLE D/C IN 1-2 DAYS, DEPENDING ON CLINICAL CONDITION.   [name] M.D on 01/02/2015 at 5:06 PM  Between 7am to 6pm - Pager - [phone]  After 6pm go to [email or web address] - password [email or web address] [facility]  [facility]  Office  [phone]  CC: Primary care physician; Mickel Fuchs, MD

## 2015-01-02 NOTE — H&P (Signed)
Jenna Robinson is an 81 y.o. female.   Chief Complaint: Fall HPI: The patient presents emergency department after suffering a fall she sustained a head laceration. The patient denies loss of consciousness. She admits that her legs became weak. Her head hit the floor but she denies headache. Notably, the patient has a past history significant for dementia. Laboratory evaluation the emergency department revealed elevated troponin. Also of concern is that the patient has microscopic blood in her urine. She denies urinary symptoms but she admits to bloating in her abdomen as well as diarrhea at times. Emergency department staff called the hospitalist service to discern the etiology of her elevated troponin.  Past Medical History  Diagnosis Date  . Diabetes mellitus without complication [facility])     Past Surgical History  Procedure Laterality Date  . Abdominal hysterectomy      Family History  Problem Relation Age of Onset  . Cancer      multiple family members including all siblings   Social History:  reports that she has never smoked. She does not have any smokeless tobacco history on file. She reports that she does not drink alcohol. Her drug history is not on file.  Allergies: No Known Allergies  Medications Prior to Admission  Medication Sig Dispense Refill  . atenolol (TENORMIN) 25 MG tablet Take 3 tablets by mouth daily.    Marland Kitchen atorvastatin (LIPITOR) 40 MG tablet Take 1 tablet by mouth daily.    . calcium-vitamin D (OSCAL WITH D) 500-200 MG-UNIT tablet Take 1 tablet by mouth 2 (two) times daily.    . insulin detemir (LEVEMIR) 100 unit/ml SOLN Inject 60 Units into the skin daily.    Marland Kitchen lisinopril (PRINIVIL,ZESTRIL) 10 MG tablet Take 1 tablet by mouth daily.    . magnesium oxide (MAG-OX) 400 MG tablet Take 400 mg by mouth 2 (two) times daily.    . metFORMIN (GLUMETZA) 500 MG (MOD) 24 hr tablet Take 2 tablets by mouth 2 (two) times daily.      Results for orders placed or performed  during the hospital encounter of 01/01/15 (from the past 48 hour(s))  Basic metabolic panel     Status: Abnormal   Collection Time: 01/01/15  8:18 PM  Result Value Ref Range   Sodium 134 (L) 135 - 145 mmol/L   Potassium 3.8 3.5 - 5.1 mmol/L   Chloride 94 (L) 101 - 111 mmol/L   CO2 24 22 - 32 mmol/L   Glucose, Bld 157 (H) 65 - 99 mg/dL   BUN 16 6 - 20 mg/dL   Creatinine, Ser 0.84 0.44 - 1.00 mg/dL   Calcium 11.2 (H) 8.9 - 10.3 mg/dL   GFR calc non Af Amer >60 >60 mL/min   GFR calc Af Amer >60 >60 mL/min    Comment: (NOTE) The eGFR has been calculated using the CKD EPI equation. This calculation has not been validated in all clinical situations. eGFR's persistently <60 mL/min signify possible Chronic Kidney Disease.    Anion gap 16 (H) 5 - 15  CBC with Differential/Platelet     Status: Abnormal   Collection Time: 01/01/15  8:18 PM  Result Value Ref Range   WBC 18.1 (H) 3.6 - 11.0 K/uL   RBC 4.69 3.80 - 5.20 MIL/uL   Hemoglobin 13.7 12.0 - 16.0 g/dL   HCT 42.3 35.0 - 47.0 %   MCV 90.1 80.0 - 100.0 fL   MCH 29.3 26.0 - 34.0 pg   MCHC 32.5 32.0 - 36.0  g/dL   RDW 13.2 11.5 - 14.5 %   Platelets 219 150 - 440 K/uL   Neutrophils Relative % 88 %   Neutro Abs 15.9 (H) 1.4 - 6.5 K/uL   Lymphocytes Relative 3 %   Lymphs Abs 0.5 (L) 1.0 - 3.6 K/uL   Monocytes Relative 9 %   Monocytes Absolute 1.7 (H) 0.2 - 0.9 K/uL   Eosinophils Relative 0 %   Eosinophils Absolute 0.0 0 - 0.7 K/uL   Basophils Relative 0 %   Basophils Absolute 0.0 0 - 0.1 K/uL  Troponin I     Status: Abnormal   Collection Time: 01/01/15  8:18 PM  Result Value Ref Range   Troponin I 0.17 (H) <0.031 ng/mL    Comment: READ BACK AND VERIFIED WITH [place]  LOYE AT 2202 01/01/15 SDR        PERSISTENTLY INCREASED TROPONIN VALUES IN THE RANGE OF 0.04-0.49 ng/mL CAN BE SEEN IN:       -UNSTABLE ANGINA       -CONGESTIVE HEART FAILURE       -MYOCARDITIS       -CHEST TRAUMA       -ARRYHTHMIAS       -LATE PRESENTING  MYOCARDIAL INFARCTION       -COPD   CLINICAL FOLLOW-UP RECOMMENDED.   CK     Status: Abnormal   Collection Time: 01/01/15  8:18 PM  Result Value Ref Range   Total CK 1015 (H) 38 - 234 U/L  Urinalysis complete, with microscopic ([facility] only)     Status: Abnormal   Collection Time: 01/01/15 10:25 PM  Result Value Ref Range   Color, Urine STRAW (A) YELLOW   APPearance CLEAR (A) CLEAR   Glucose, UA 50 (A) NEGATIVE mg/dL   Bilirubin Urine NEGATIVE NEGATIVE   Ketones, ur TRACE (A) NEGATIVE mg/dL   Specific Gravity, Urine 1.004 (L) 1.005 - 1.030   Hgb urine dipstick 2+ (A) NEGATIVE   pH 5.0 5.0 - 8.0   Protein, ur NEGATIVE NEGATIVE mg/dL   Nitrite NEGATIVE NEGATIVE   Leukocytes, UA NEGATIVE NEGATIVE   RBC / HPF 0-5 0 - 5 RBC/hpf   WBC, UA NONE SEEN 0 - 5 WBC/hpf   Bacteria, UA NONE SEEN NONE SEEN   Squamous Epithelial / LPF NONE SEEN NONE SEEN   Mucous PRESENT   Glucose, capillary     Status: Abnormal   Collection Time: 01/02/15  2:10 AM  Result Value Ref Range   Glucose-Capillary 182 (H) 65 - 99 mg/dL  Troponin I     Status: Abnormal   Collection Time: 01/02/15  2:19 AM  Result Value Ref Range   Troponin I 0.68 (H) <0.031 ng/mL    Comment: PREVIOUS RESULT CALLED AT 2202  01/01/15 BY SDR        POSSIBLE MYOCARDIAL ISCHEMIA. SERIAL TESTING RECOMMENDED.    Dg Chest 2 View  01/01/2015  CLINICAL DATA:  Syncope EXAM: CHEST  2 VIEW COMPARISON:  04/10/2011 FINDINGS: Normal heart size and mediastinal contours. No acute infiltrate or edema. No effusion or pneumothorax. No acute osseous findings. IMPRESSION: No active cardiopulmonary disease or change from 2013. Electronically Signed   By: Monte Fantasia M.D.   On: 01/01/2015 22:56   Ct Head Wo Contrast  01/01/2015  CLINICAL DATA:  81 year old female with head trauma EXAM: CT HEAD WITHOUT CONTRAST TECHNIQUE: Contiguous axial images were obtained from the base of the skull through the vertex without intravenous contrast. COMPARISON:  CT  dated  01/10/13 FINDINGS: The ventricles are dilated and the sulci are prominent compatible with age-related atrophy. Periventricular and deep white matter hypodensities represent chronic microvascular ischemic changes. Small old left basal ganglia lacunar infarct. There is no intracranial hemorrhage. No mass effect or midline shift identified. The visualized paranasal sinuses and mastoid air cells are well aerated. The calvarium is intact. Right posterior occipital scalp hematoma. IMPRESSION: No acute intracranial hemorrhage. Age-related atrophy and chronic microvascular ischemic disease. Electronically Signed   By: Anner Crete M.D.   On: 01/01/2015 20:35   Dg Hip Unilat With Pelvis 2-3 Views Right  01/01/2015  CLINICAL DATA:  [place] today.  Right hip pain. EXAM: DG HIP (WITH OR WITHOUT PELVIS) 2-3V RIGHT COMPARISON:  None. FINDINGS: Both hips are normally located. Mild degenerative changes. No acute hip fracture. The pubic symphysis and SI joints are intact. No pelvic fractures. Scoliosis and degenerative changes noted in the lower lumbar spine. IMPRESSION: No acute hip fracture. Electronically Signed   By: Marijo Sanes M.D.   On: 01/01/2015 20:49    Review of Systems  Constitutional: Negative for fever and chills.  HENT: Negative for sore throat and tinnitus.   Eyes: Negative for blurred vision and redness.  Respiratory: Negative for cough and shortness of breath.   Cardiovascular: Negative for chest pain, palpitations, orthopnea and PND.  Gastrointestinal: Negative for nausea, vomiting, abdominal pain and diarrhea.  Genitourinary: Positive for hematuria. Negative for dysuria, urgency and frequency.  Musculoskeletal: Positive for falls. Negative for myalgias and joint pain.  Skin: Negative for rash.       No lesions  Neurological: Positive for loss of consciousness (???). Negative for speech change, focal weakness and weakness.  Endo/Heme/Allergies: Bruises/bleeds easily.       No  temperature intolerance  Psychiatric/Behavioral: Negative for depression and suicidal ideas.    Blood pressure 138/54, pulse 76, temperature 97.7 F (36.5 C), temperature source Oral, resp. rate 18, weight 73.664 kg (162 lb 6.4 oz), SpO2 94 %. Physical Exam  Vitals reviewed. Constitutional: She is oriented to person, place, and time. She appears well-developed and well-nourished. No distress.  HENT:  Head: Normocephalic and atraumatic.  Mouth/Throat: Oropharynx is clear and moist.  Eyes: Conjunctivae and EOM are normal. Pupils are equal, round, and reactive to light.  Neck: Normal range of motion. Neck supple. No JVD present. No tracheal deviation present. No thyromegaly present.  Cardiovascular: Normal rate, regular rhythm and normal heart sounds.  Exam reveals no gallop and no friction rub.   No murmur heard. Respiratory: Effort normal and breath sounds normal.  GI: Soft. Bowel sounds are normal. She exhibits no distension. There is no tenderness.  Genitourinary:  Deferred  Musculoskeletal: Normal range of motion. She exhibits no edema.  Lymphadenopathy:    She has no cervical adenopathy.  Neurological: She is alert and oriented to person, place, and time. No cranial nerve deficit. She exhibits normal muscle tone.  Skin: Skin is warm and dry.  Head laceration posterior left  Psychiatric: She has a normal mood and affect. Her behavior is normal. Judgment and thought content normal. She exhibits normal recent memory.     Assessment/Plan This is a 81 year old Caucasian female admitted for elevated troponin as well as generalized weakness and bleeding. 1. Elevated troponin: The patient denies chest pain, shortness of breath, nausea, vomiting or diaphoresis. Follow cardiac enzymes. No evidence of cardiac ischemia on EKG. Cardiology consultation at the discretion of the primary team. 2. Laceration of scalp: Hemostasis achieved. Change dressing as needed  3. Falls: PT/OT evaluation 4.  Bleeding: Unclear source. Patient had large amounts of dried blood in her diaper and microscopic blood in her urine. She is status post hysterectomy 2 years ago, presumably due to malignancy or findings suspicious for malignancy. Differential diagnosis also includes colon cancer. GYN and gastroenterology consults at the discretion of the primary team. 5. Diabetes mellitus type 2: continue basal insulin. I have added sliding scale insulin on the patient is hospitalized. 6. Essential hypertension: Continue atenolol and lisinopril 7. DVT prophylaxis: Subcutaneous heparin 8. GI prophylaxis: None as the patient is not critically ill The patient is a full code at this time. Time spent on admission orders and patient care approximately 45 minutes  Harrie Foreman 01/02/2015, 6:11 AM

## 2015-01-02 NOTE — Clinical Social Work Placement (Signed)
   CLINICAL SOCIAL WORK PLACEMENT  NOTE  Date:  01/02/2015  Patient Details  Name: Jenna Robinson MRN: [id number] Date of Birth: Oct 01, 1925  Clinical Social Work is seeking post-discharge placement for this patient at the Skilled  Nursing Facility level of care (*CSW will initial, date and re-position this form in  chart as items are completed):  Yes   Patient/family provided with [place] Clinical Social Work Department's list of facilities offering this level of care within the geographic area requested by the patient (or if unable, by the patient's family).  Yes   Patient/family informed of their freedom to choose among providers that offer the needed level of care, that participate in Medicare, Medicaid or managed care program needed by the patient, have an available bed and are willing to accept the patient.  Yes   Patient/family informed of [facility]'s ownership interest in [facility] and [facility], as well as of the fact that they are under no obligation to receive care at these facilities.  PASRR submitted to EDS on 01/02/15     PASRR number received on 01/02/15     Existing PASRR number confirmed on       FL2 transmitted to all facilities in geographic area requested by pt/family on 01/02/15     FL2 transmitted to all facilities within larger geographic area on       Patient informed that his/her managed care company has contracts with or will negotiate with certain facilities, including the following:        Yes ([facility]- Private Pay)   Patient/family informed of bed offers received.  Patient chooses bed at       Physician recommends and patient chooses bed at      Patient to be transferred to   on  .  Patient to be transferred to facility by       Patient family notified on   of transfer.  Name of family member notified:        PHYSICIAN       Additional Comment:    _______________________________________________ Idamae Lusherhristina E  Zvi Duplantis, LCSW 01/02/2015, 4:20 PM

## 2015-01-02 NOTE — Clinical Social Work Note (Signed)
Clinical Social Work Assessment  Patient Details  Name: Jenna Robinson MRN: [id number] Date of Birth: December 15, 1925  Date of referral:  01/02/15               Reason for consult:                   Permission sought to share information with:  Facility Medical sales representativeContact Representative, Family Supports Permission granted to share information::  Yes, Verbal Permission Granted  Name::      (niece Almyra FreeLibby)  Agency::     Relationship::     Contact Information:     Housing/Transportation Living arrangements for the past 2 months:  Single Family Home Source of Information:  Patient, Other (Comment Required) (niece) Patient Interpreter Needed:  None Criminal Activity/Legal Involvement Pertinent to Current Situation/Hospitalization:    Significant Relationships:  Other Family Members Lives with:  Self Do you feel safe going back to the place where you live?  Yes Need for family participation in patient care:     Care giving concerns:  None at this time   Office managerocial Worker assessment / plan:  Visual merchandiserClinical Social Worker (CSW) consult for SNF placement, PT is recommending STR to assist with getting patient back to previous level of functioning.   Patient was alert, oriented and engaged in conversation with CSW.  (Additional Information provided by niece Almyra FreeLibby at bed side).    CSW introduced self and explained role of CSW department.  Patient currently lives alone. She is retired and was independent of all ADL's prior to [facility].  Patient still drives.  CSW explained that PT is recommending rehab.  Patient has never been to SNF. CSW reviewed SNF process with patient, Patient is agreeable to SNF search and prefers [facility].  CSW explained that in order for Medicare  to pay for rehab patient will need a 3 night qualifying inpatient stay. Patient will not meet the 3 night stay requirement, but is willing to private pay.   CSW will complete FL2, and fax out for available bed in anticipation of  discharging to SNF for rehab.   Employment status:  Retired Health and safety inspectornsurance information:  Medicare PT Recommendations:  Skilled Nursing Facility Information / Referral to community resources:  Skilled Nursing Facility  Patient/Family's Response to care:  Patient was appreciative of information provided by CSW and will review list with niece.  Patient/Family's Understanding of and Emotional Response to Diagnosis, Current Treatment, and Prognosis:  Patient understands that she is under continued medical work up at this time.  Once medically stable she will discharge to SNF.  Emotional Assessment Appearance:  Appears stated age Attitude/Demeanor/Rapport:  Lethargic Affect (typically observed):  Calm, Pleasant, Accepting, Adaptable Orientation:  Oriented to Self, Oriented to Place, Oriented to  Time, Oriented to Situation Alcohol / Substance use:  Never Used Psych involvement (Current and /or in the community):  No (Comment)  Discharge Needs  Concerns to be addressed:  Care Coordination, Discharge Planning Concerns Readmission within the last 30 days:  No Current discharge risk:  Dependent with Mobility, Lack of support system, Lives alone Barriers to Discharge:  Continued Medical Work up   Soundra PilonMoore, Damione Robideau H, LCSW 01/02/2015, 3:17 PM

## 2015-01-02 NOTE — ED Notes (Signed)
Pt staples dressed with gauze and curlex. Bleeding controlled

## 2015-01-02 NOTE — Progress Notes (Signed)
Physical Therapy Evaluation Patient Details Name: Jenna PhilipsBarbara Vernell Robinson MRN: [id number] DOB: Jun 09, 1925 Today's Date: 01/02/2015   History of Present Illness  The patient presents emergency department after suffering a fall she sustained a head laceration. The patient denies loss of consciousness. She admits that her legs became weak. Her head hit the floor but she denies headache. Notably, the patient has a past history significant for dementia. Pt with elevated troponin.  Clinical Impression  Pt presents with decreased functional strength with bed mobility, transfers and gait and would benefit from acute PT services to address objective findings and for pt safety.  Pt has h/o falls and continues to be at risk for falls given pt's decreased balance and weakness this session.  Pt c/o "feeling weak in the legs and head" and that she doesn't think she can take care of herself.  Pt required Min A for ambulation 20' with overall diminished balance reactions with gait.    Follow Up Recommendations SNF    Equipment Recommendations  Rolling walker with 5" wheels    Recommendations for Other Services       Precautions / Restrictions Precautions Precautions: Fall Restrictions Weight Bearing Restrictions: No      Mobility  Bed Mobility Overal bed mobility: Modified Independent             General bed mobility comments: Extra time, bed rails, and HOB elevated  Transfers Overall transfer level: Needs assistance Equipment used: Rolling walker (2 wheeled) Transfers: Sit to/from Stand Sit to Stand: Min guard         General transfer comment: verbal cues for hand placement and safety; multiple attempts to rise into standing, fell back into sitting on bed after first attempt, able to maintain upright standing on remaining attempts.  Ambulation/Gait Ambulation/Gait assistance: Min guard Ambulation Distance (Feet): 15 Feet Assistive device: Rolling walker (2 wheeled) Gait  Pattern/deviations: Step-through pattern;Shuffle     General Gait Details: Very slow gait with decreased foot clearance; noticeable decreased balance with turns; reaching for wall rail and letting go of RW multiple times during session.  Stairs            Wheelchair Mobility    Modified Rankin (Stroke Patients Only)       Balance Overall balance assessment: Needs assistance Sitting-balance support: Feet supported Sitting balance-Leahy Scale: Good Sitting balance - Comments: Good sittining in midline.   Standing balance support: Bilateral upper extremity supported Standing balance-Leahy Scale: Fair Standing balance comment: Needs UE support on walker or wall rail.                             Pertinent Vitals/Pain Pain Assessment: No/denies pain    Home Living Family/patient expects to be discharged to:: Private residence Living Arrangements: Alone Available Help at Discharge: Family Type of Home: House Home Access: Stairs to enter Entrance Stairs-Rails: Right Entrance Stairs-Number of Steps: 3 Home Layout: One level Home Equipment: None      Prior Function Level of Independence: Independent         Comments: Has a life line that she wears around her neck, but did not remember to activate it when she fell and could not get up.     Hand Dominance        Extremity/Trunk Assessment   Upper Extremity Assessment: Generalized weakness           Lower Extremity Assessment: Generalized weakness  Communication   Communication: No difficulties  Cognition Arousal/Alertness: Awake/alert Behavior During Therapy: WFL for tasks assessed/performed Overall Cognitive Status: Within Functional Limits for tasks assessed                      General Comments General comments (skin integrity, edema, etc.): posterior head lac, R elbow abrasion    Exercises        Assessment/Plan    PT Assessment Patient needs continued PT  services  PT Diagnosis Difficulty walking;Generalized weakness   PT Problem List Decreased strength;Decreased activity tolerance;Decreased balance;Decreased mobility;Decreased safety awareness  PT Treatment Interventions Gait training;Stair training;Functional mobility training;Therapeutic activities;Therapeutic exercise;Balance training;Patient/family education   PT Goals (Current goals can be found in the Care Plan section) Acute Rehab PT Goals Patient Stated Goal: "I rather go to a rest home than live with family." PT Goal Formulation: With patient Time For Goal Achievement: 01/09/15 Potential to Achieve Goals: Fair    Frequency Min 2X/week   Barriers to discharge Decreased caregiver support lives alone    Co-evaluation               End of Session Equipment Utilized During Treatment: Gait belt Activity Tolerance: Patient limited by fatigue Patient left: in chair;with call bell/phone within reach;with chair alarm set Nurse Communication: Mobility status    Functional Assessment Tool Used: clinical judgement Functional Limitation: Mobility: Walking and moving around Mobility: Walking and Moving Around Current Status [phone]): At least 40 percent but less than 60 percent impaired, limited or restricted Mobility: Walking and Moving Around Goal Status [phone]): At least 20 percent but less than 40 percent impaired, limited or restricted    Time: 2841-3244 PT Time Calculation (min) (ACUTE ONLY): 30 min   Charges:   PT Evaluation $Initial PT Evaluation Tier I: 1 Procedure     PT G Codes:   PT G-Codes **NOT FOR INPATIENT CLASS** Functional Assessment Tool Used: clinical judgement Functional Limitation: Mobility: Walking and moving around Mobility: Walking and Moving Around Current Status (W1027): At least 40 percent but less than 60 percent impaired, limited or restricted Mobility: Walking and Moving Around Goal Status [phone]): At least 20 percent but less than 40 percent  impaired, limited or restricted    Jenna Robinson 01/02/2015, 12:13 PM

## 2015-01-02 NOTE — ED Provider Notes (Signed)
Time Seen: Approximately 2020 I have reviewed the triage notes  Chief Complaint: Fall   History of Present Illness: Jenna Robinson is a 81 y.o. female who was transported here by EMS after a fall. Patient describes trying to do some work in the kitchen and states she "" had to sit down "" and is not sure exactly what happened after that point. HAVING difficulty getting up and ambulating at home and states that the fall occurred at approximately 4 to 5PM and was transported here approximately 8 PM. The fall was unwitnessed and the patient apparently lives by herself and is fairly independent though she does have a history of dementia. She complains mostly of right hip pain and has some abrasion on her right elbow and a head laceration from her fall. She denies any neck, thoracic, or lumbar spine pain  Past Medical History  Diagnosis Date  . Diabetes mellitus without complication [facility])     Patient Active Problem List   Diagnosis Date Noted  . Elevated troponin 01/02/2015    Past Surgical History  Procedure Laterality Date  . Abdominal hysterectomy      Past Surgical History  Procedure Laterality Date  . Abdominal hysterectomy      No current outpatient prescriptions on file.  Allergies:  Review of patient's allergies indicates no known allergies.  Family History: History reviewed. No pertinent family history.  Social History: Social History  Substance Use Topics  . Smoking status: Never Smoker   . Smokeless tobacco: None  . Alcohol Use: No     Review of Systems:   10 point review of systems was performed and was otherwise negative: Due to the patient's dementia review of systems was taken through the family along with the patient Constitutional: No fever Eyes: No visual disturbances ENT: No sore throat, ear pain Cardiac: No chest pain Respiratory: No shortness of breath, wheezing, or stridor Abdomen: No abdominal pain, no vomiting, No diarrhea Endocrine:  No weight loss, No night sweats Extremities: No peripheral edema, cyanosis Skin: No rashes, easy bruising Neurologic: No focal weakness, trouble with speech or swollowing Urologic: No dysuria, Hematuria, or urinary frequency   Physical Exam:  ED Triage Vitals  Enc Vitals Group     BP 01/01/15 2002 150/76 mmHg     Pulse Rate 01/01/15 2002 48     Resp 01/01/15 2002 16     Temp 01/01/15 2002 97.6 F (36.4 C)     Temp Source 01/01/15 2002 Oral     SpO2 01/01/15 2002 93 %     Weight --      Height --      Head Cir --      Peak Flow --      Pain Score 01/01/15 2003 10     Pain Loc --      Pain Edu? --      Excl. in GC? --     General: Awake , Alert , and Oriented times 3; GCS 15 Head: Normal cephalic , posterior crush laceration to the occipital area. No crepitus or step-off noticed within the wound or bony spicules. Eyes: Pupils equal , round, reactive to light Nose/Throat: No nasal drainage, patent upper airway without erythema or exudate.  Neck: Supple, Full range of motion, good flexion and extension with adding crepitus or step-off noted or posterior cervical spine tenderness No anterior adenopathy or palpable thyroid masses Lungs: Clear to ascultation without wheezes , rhonchi, or rales Heart: Regular rate, irregular rhythm  without murmurs , gallops , or rubs Abdomen: Soft, non tender without rebound, guarding , or rigidity; bowel sounds positive and symmetric in all 4 quadrants. No organomegaly .        Extremities: Right hip shows some tenderness toward the right pelvic region without crepitus or step-off noted. No leg shortening or malrotation Neurologic: normal ambulation, Motor symmetric without deficits, sensory intact Skin: warm, dry, no rashes   Labs:   All laboratory work was reviewed including any pertinent negatives or positives listed below:  Labs Reviewed  BASIC METABOLIC PANEL - Abnormal; Notable for the following:    Sodium 134 (*)    Chloride 94 (*)     Glucose, Bld 157 (*)    Calcium 11.2 (*)    Anion gap 16 (*)    All other components within normal limits  CBC WITH DIFFERENTIAL/PLATELET - Abnormal; Notable for the following:    WBC 18.1 (*)    Neutro Abs 15.9 (*)    Lymphs Abs 0.5 (*)    Monocytes Absolute 1.7 (*)    All other components within normal limits  TROPONIN I - Abnormal; Notable for the following:    Troponin I 0.17 (*)    All other components within normal limits  CK - Abnormal; Notable for the following:    Total CK 1015 (*)    All other components within normal limits  URINALYSIS COMPLETEWITH MICROSCOPIC ([facility] ONLY) - Abnormal; Notable for the following:    Color, Urine STRAW (*)    APPearance CLEAR (*)    Glucose, UA 50 (*)    Ketones, ur TRACE (*)    Specific Gravity, Urine 1.004 (*)    Hgb urine dipstick 2+ (*)    All other components within normal limits    EKG:  ED ECG REPORT I, Jennye MoccasinBrian S Bambie Pizzolato, the attending physician, personally viewed and interpreted this ECG.  Date: 01/02/2015 EKG Time: 2015 Rate: *85 Rhythm: normal sinus rhythm occasional PACs QRS Axis: normal Intervals: normal ST/T Wave abnormalities: normal Conduction Disutrbances: none Narrative Interpretation: unremarkable Poor R-wave progression in the anterior leads No acute ischemic change noted   Radiology:   EXAM: CHEST 2 VIEW  COMPARISON: 04/10/2011  FINDINGS: Normal heart size and mediastinal contours. No acute infiltrate or edema. No effusion or pneumothorax. No acute osseous findings.  IMPRESSION: No active cardiopulmonary disease or change from 2013.   Electronically Signed By: Marnee SpringJonathon Watts M.D. On: 01/01/2015 22:56          DG HIP UNILAT WITH PELVIS 2-3 VIEWS RIGHT (Final result) Result time: 01/01/15 20:49:39   Final result by Rad Results In Interface (01/01/15 20:49:39)   Narrative:   CLINICAL DATA: Larey SeatFell today. Right hip pain.  EXAM: DG HIP (WITH OR WITHOUT PELVIS) 2-3V  RIGHT  COMPARISON: None.  FINDINGS: Both hips are normally located. Mild degenerative changes. No acute hip fracture. The pubic symphysis and SI joints are intact. No pelvic fractures. Scoliosis and degenerative changes noted in the lower lumbar spine.  IMPRESSION: No acute hip fracture.   Electronically Signed By: Rudie MeyerP. Gallerani M.D. On: 01/01/2015 20:49          CT Head Wo Contrast (Final result) Result time: 01/01/15 20:35:17   Final result by Rad Results In Interface (01/01/15 20:35:17)   Narrative:   CLINICAL DATA: 81 year old female with head trauma  EXAM: CT HEAD WITHOUT CONTRAST  TECHNIQUE: Contiguous axial images were obtained from the base of the skull through the vertex without intravenous contrast.  COMPARISON:  CT dated 01/10/13  FINDINGS: The ventricles are dilated and the sulci are prominent compatible with age-related atrophy. Periventricular and deep white matter hypodensities represent chronic microvascular ischemic changes. Small old left basal ganglia lacunar infarct. There is no intracranial hemorrhage. No mass effect or midline shift identified.  The visualized paranasal sinuses and mastoid air cells are well aerated. The calvarium is intact. Right posterior occipital scalp hematoma.  IMPRESSION: No acute intracranial hemorrhage.  Age-related atrophy and chronic microvascular ischemic disease.   Electronically Signed By: Elgie Collard M.D. On: 01/01/2015 20:35        I personally reviewed the radiologic studies   Procedures:  LACERATION REPAIR Performed by: Jennye Moccasin Authorized by: Jennye Moccasin Consent: Verbal consent obtained. Risks and benefits: risks, benefits and alternatives were discussed Consent given by: patient Patient identity confirmed: provided demographic data Prepped and Draped in normal sterile fashion Wound explored  Laceration Location: Occipital scalp  Laceration Length: 4  cm  No Foreign Bodies seen or palpated  Anesthesia: local infiltration  Local anesthetic: Topical let   Anesthetic total: 3 ml  Irrigation method: syringe Amount of cleaning: standard  Skin closure: Staples   Number of sutures: 4 Technique: Interrupted   The wound was difficult to realign secondary to it being a crush injury with laceration. Patient tolerance: Patient tolerated the procedure well with no immediate complications.     ED Course:  Patient's stay here was uneventful she has some soreness in her right hip though does seem to have full range of motion patient may require further objective studies of her hip if she is unable to ambulate. Insert is over elevated troponin in the instance of a fall she also has a high CK level patient describes some generalized weakness with significant head injury.    Assessment: Status post fall questionable syncope Acute closed head injury Staple repair single layer non-complicated Elevated troponin of unknown significance   Final Clinical Impression:   Final diagnoses:  Weakness     Plan: Inpatient I spoke to the hospitalist team, further disposition and management per their evaluation           Jennye Moccasin, MD 01/02/15 [phone]

## 2015-01-02 NOTE — NC FL2 (Signed)
[place] MEDICAID FL2 LEVEL OF CARE SCREENING TOOL     IDENTIFICATION  Patient Name: Jenna Robinson Birthdate: 1925-08-24 Sex: female Admission Date (Current Location): 01/01/2015  [place] and [place] Number:  Chiropodist and Address:  [facility], [address] [place], [place] [address]      Provider Number: [id number]  Attending Physician Name and Address:  [name], MD  Relative Name and Phone Number:       Current Level of Care: Hospital Recommended Level of Care: Skilled Nursing Facility Prior Approval Number:    Date Approved/Denied:   PASRR Number:  [id number])  Discharge Plan: SNF    Current Diagnoses: Patient Active Problem List   Diagnosis Date Noted  . Elevated troponin 01/02/2015    Orientation RESPIRATION BLADDER Height & Weight    Self, Time, Situation, Place  Normal Continent  (180.3 cm) 162 lbs.  BEHAVIORAL SYMPTOMS/MOOD NEUROLOGICAL BOWEL NUTRITION STATUS   (None.)  (None) Continent Diet (Heart Healthy)  AMBULATORY STATUS COMMUNICATION OF NEEDS Skin   Extensive Assist Verbally Other (Comment) (Laceration, Hand, MID Guaze (PRN))                       Personal Care Assistance Level of Assistance  Bathing, Feeding, Dressing Bathing Assistance: Limited assistance Feeding assistance: Independent Dressing Assistance: Limited assistance     Functional Limitations Info  Sight, Hearing, Speech Sight Info: Impaired (Wears glasses ) Hearing Info: Impaired (Hard at hearing) Speech Info: Adequate    SPECIAL CARE FACTORS FREQUENCY  PT (By licensed PT)     PT Frequency: 5              Contractures      Additional Factors Info  Code Status, Insulin Sliding Scale Code Status Info:  (Full Code)     Insulin Sliding Scale Info:  (Novolog)       Current Medications (01/02/2015):  This is the current hospital active medication list Current Facility-Administered  Medications  Medication Dose Route Frequency Provider Last Rate Last Dose  . 0.9 %  sodium chloride infusion   Intravenous Continuous Arnaldo Natal, MD 125 mL/hr at 01/02/15 1121    . acetaminophen (TYLENOL) tablet 650 mg  650 mg Oral Q6H PRN Arnaldo Natal, MD       Or  . acetaminophen (TYLENOL) suppository 650 mg  650 mg Rectal Q6H PRN Arnaldo Natal, MD      . atenolol (TENORMIN) tablet 75 mg  75 mg Oral Daily Arnaldo Natal, MD   75 mg at 01/02/15 0836  . atorvastatin (LIPITOR) tablet 40 mg  40 mg Oral QHS Arnaldo Natal, MD      . calcium-vitamin D (OSCAL WITH D) 500-200 MG-UNIT per tablet 1 tablet  1 tablet Oral BID Arnaldo Natal, MD   1 tablet at 01/02/15 [phone]  . docusate sodium (COLACE) capsule 100 mg  100 mg Oral BID Arnaldo Natal, MD   100 mg at 01/02/15 9629  . heparin injection 5,000 Units  5,000 Units Subcutaneous 3 times per day Arnaldo Natal, MD   5,000 Units at 01/02/15 1435  . insulin aspart (novoLOG) injection 0-9 Units  0-9 Units Subcutaneous TID WC Arnaldo Natal, MD   3 Units at 01/02/15 1125  . insulin detemir (LEVEMIR) injection 16 Units  16 Units Subcutaneous QHS Arnaldo Natal, MD   16 Units at 01/02/15 0256  . lisinopril (PRINIVIL,ZESTRIL) tablet 10  mg  10 mg Oral Daily Arnaldo NatalMichael S Diamond, MD   10 mg at 01/02/15 [id number]  . magnesium oxide (MAG-OX) tablet 400 mg  400 mg Oral BID Arnaldo NatalMichael S Diamond, MD   400 mg at 01/02/15 [id number]  . morphine 2 MG/ML injection 1 mg  1 mg Intravenous Q4H PRN Arnaldo NatalMichael S Diamond, MD      . ondansetron [facility](ZOFRAN) tablet 4 mg  4 mg Oral Q6H PRN Arnaldo NatalMichael S Diamond, MD       Or  . ondansetron [facility](ZOFRAN) injection 4 mg  4 mg Intravenous Q6H PRN Arnaldo NatalMichael S Diamond, MD      . sodium chloride 0.9 % injection 3 mL  3 mL Intravenous Q12H Arnaldo NatalMichael S Diamond, MD   3 mL at 01/02/15 [id number]     Discharge Medications: Please see discharge summary for a list of discharge medications.  Relevant Imaging Results:  Relevant Lab  Results:   Additional Information  (SSN: [id number])  Verta Ellenhristina E Sunkins, LCSW

## 2015-01-02 NOTE — Care Management Obs Status (Signed)
MEDICARE OBSERVATION STATUS NOTIFICATION   Patient Details  Name: Alvira PhilipsBarbara Vernell [place]enton MRN: [id number] Date of Birth: January 15, 1925   Medicare Observation Status Notification Given:       Eber HongGreene, Amarrion Pastorino R, RN 01/02/2015, 9:42 AM

## 2015-01-02 NOTE — Care Management (Signed)
Patient lives alone and says she was peeling an onion and "just had to sit down'  She is not sure what happened.  She lives alone, independent in all her adls and currently drives.  She drove herself to the grocery stor 12/20.  Physical therapy assessed patient and relayed that based on today's evaluation, patient would benefit from skilled nursing placement.  spoke with patient and her niece Princella Ion  [phone].  Discussed that at the time of conversation, patient met observation and as outline on the Medicare obs notice given earlier this morning, medicare will not cover skilled nursing stay for observation admission.  Showed [name] the [employer] Observation  notice patient had signed earlier for reivew.  Discussed the option of going to skilled nursing under private pay- which the patient is willing to do.  The other option would be discharging home with 24 hour supervision and receive home health nursing and physical therapy which is covered 100% by medicare.  [place] says Patient does not listen, she won't do anything she is told; she does what she wants when she wants.  [place] was not able to relay how this would adversely affect patient if she discharged home.  Discussed that skilled nursing stay would not change thins that patient likes to do.  Patient said "I am 6- I want to do what i want."  [place] says that needed to speak with Laurey Arrow who is patient hcpoa.  There are no documents onsite proving that Laurey Arrow is legally HCPOA.  [place] says he is a nephew. Discussed that patient has not been deemed incompetent and at present she is making perfect sense and knows what she wants.  [place] say that patient has dementia.  Discussed that this did not necessarily mean that patient could not make decisions for herself.  The patient is alert and oriented and all of her responses are appropriate.  CM was informed that family in patient's room are verbalizing that "patient never should have been allowed to sign the  notice.  CM went and explained to patient and family that whether notice is signed or not - it does not change the fact that patient admitted under observation.  CM reviewed the medical record at end of day to determine if observation is still appropriate and found that patient met inpatient under Rhabdomyolysis.  Obtained order from attending for inpatient admit.  Explained to patient/family that this does not automatically mean that medicare will pay for skilled nursing stay.  The patient must MEDICALLY REQUIRE three night stay.  If found to be medically stable for dischahrge before that occurs- a skilled nursing stay would not be covered.  Patient maintains that she can pay privately.  If patient discharge, she will require script for front wheeled rolling walker

## 2015-01-02 NOTE — Progress Notes (Signed)
CSW spoke to patient and niece, Almyra FreeLibby at bedside. CSW informed patient and niece that [facility] private pay would require 7 days of fee "up front". Family was informed that the cost per day is $287.00. CSW also provided the 7 day "up front" fee of $2,009.00. CSW informed patient and her niece that this fee only covers room and board and there will be an additional fee for patient's medication. CSW informed patient and family that patient's PT will be covered by patient's Medicare Part B. The family was also informed that patient would have room on the Long Term Care Unit vs the Rehab unit due to the cost of the Rehab room price being more expensive. Patient and her family reports that they wanted to discuss the fee requirements with patient's husband. Patient's niece reports that she'll follow up with Care Manager, Nann tomorrow morning about their decision. CSW informed patient and her family that she will be available if any other needs were to arise. CSW will continue to assist and follow as needed.   Woodroe Modehristina Gloriajean Okun, MSW, LCSW-A Clinical Social Work Department [phone]

## 2015-01-03 ENCOUNTER — Encounter
Admission: RE | Admit: 2015-01-03 | Discharge: 2015-01-03 | Disposition: A | Discharge status: Home / Self Care | Payer: Medicare Other | Source: Ambulatory Visit | Attending: Internal Medicine | Admitting: Internal Medicine

## 2015-01-03 LAB — CBC
HEMATOCRIT: 34.9 % — AB (ref 35.0–47.0)
Hemoglobin: 11.4 g/dL — ABNORMAL LOW (ref 12.0–16.0)
MCH: 29.8 pg (ref 26.0–34.0)
MCHC: 32.8 g/dL (ref 32.0–36.0)
MCV: 90.9 fL (ref 80.0–100.0)
PLATELETS: 146 10*3/uL — AB (ref 150–440)
RBC: 3.84 MIL/uL (ref 3.80–5.20)
RDW: 13.4 % (ref 11.5–14.5)
WBC: 6.3 10*3/uL (ref 3.6–11.0)

## 2015-01-03 LAB — BASIC METABOLIC PANEL
ANION GAP: 6 (ref 5–15)
BUN: 10 mg/dL (ref 6–20)
CO2: 27 mmol/L (ref 22–32)
Calcium: 9.7 mg/dL (ref 8.9–10.3)
Chloride: 106 mmol/L (ref 101–111)
Creatinine, Ser: 0.51 mg/dL (ref 0.44–1.00)
GFR calc Af Amer: >60 mL/min (ref >60)
GLUCOSE: 100 mg/dL — AB (ref 65–99)
POTASSIUM: 3.3 mmol/L — AB (ref 3.5–5.1)
Sodium: 139 mmol/L (ref 135–145)

## 2015-01-03 LAB — GLUCOSE, CAPILLARY
Glucose-Capillary: 86 mg/dL (ref 65–99)
Glucose-Capillary: 214 mg/dL — ABNORMAL HIGH (ref 65–99)
Glucose-Capillary: 248 mg/dL — ABNORMAL HIGH (ref 65–99)

## 2015-01-03 LAB — CK: Total CK: 633 U/L — ABNORMAL HIGH (ref 38–234)

## 2015-01-03 MED ORDER — POTASSIUM CHLORIDE CRYS ER 20 MEQ PO TBCR
40.0000 meq | EXTENDED_RELEASE_TABLET | Freq: Once | ORAL | Status: AC
  Administered 2015-01-03: 40 meq via ORAL
  Filled 2015-01-03: qty 2

## 2015-01-03 MED ORDER — DIPHENHYDRAMINE HCL 25 MG PO CAPS
ORAL_CAPSULE | ORAL | Status: AC
  Filled 2015-01-03: qty 1

## 2015-01-03 MED ORDER — ZOLPIDEM TARTRATE 5 MG PO TABS
5.0000 mg | ORAL_TABLET | Freq: Every evening | ORAL | Status: DC | PRN
  Administered 2015-01-03 – 2015-01-04 (×2): 5 mg via ORAL
  Filled 2015-01-03 (×2): qty 1

## 2015-01-03 NOTE — Progress Notes (Signed)
Received transfer patient from 2A. Re[place] report from [place].

## 2015-01-03 NOTE — Progress Notes (Addendum)
[facility]. VS stable at time of transfer. Orderly called for transport. Patient family notified

## 2015-01-03 NOTE — Progress Notes (Signed)
Blood sugar 133 1 unit of insulin required

## 2015-01-03 NOTE — Evaluation (Signed)
Occupational Therapy Evaluation Patient Details Name: Jenna Robinson MRN: [id number] DOB: 1925/09/14 Today's Date: 01/03/2015    History of Present Illness The patient presents emergency department after suffering a fall she sustained a head laceration. The patient denies loss of consciousness. She admits that her legs became weak. Her head hit the floor but she denies headache. Notably, the patient has a past history significant for dementia. Pt with elevated troponin.   Clinical Impression   Pt seen for OT evaluation after obtaining clearance from NSG.  Pt was sitting at EOB eating a cracker and was oriented to person, place and month and year but not date but knew it was almost Christmas.  She recalled that she fell but does not remember why she fell and that she had a cut on her head but did not remember it was in the back of her head.  She was able to complete ADLs with supervision but needed cues for safety due to decreased balance, decreased safety awareness and impulsivity. She also needed rest breaks when moving from bed to sink to complete grooming task and needed to take a seated rest break and needed cues to look for EOB before sitting down suddenly. Since pt lives at home alone, rec SNF to increase independence and safety for ADLs to prevent further falls.  Continue OT for ADL training, functional mobility training and safety awareness training.      Follow Up Recommendations  SNF    Equipment Recommendations  Tub/shower seat (rec grab bars in shower)    Recommendations for Other Services       Precautions / Restrictions Precautions Precautions: Fall Restrictions Weight Bearing Restrictions: No      Mobility Bed Mobility Overal bed mobility: Modified Independent             General bed mobility comments: Extra time and bed rails; verbal cues to scoot up in bed  Transfers Overall transfer level: Needs assistance Equipment used: Rolling walker (2  wheeled) Transfers: Sit to/from Stand Sit to Stand: Min guard         General transfer comment: Practiced sit<>stand transfer with RW x 5 reps.  Pt continues to pull on RW with B UE and needs verbal and tactile re-direction to push from chair.  Pt continues to be unsafe with transfers using RW.    Balance                                            ADL Overall ADL's : Needs assistance/impaired                                       General ADL Comments: Pt is able to complete ADLs with supervision but at risk for falls due to decreased safety, impulsivity, and decreased problem solving and impaired high level balance.  Pt requires cues for follow through of self care task and set up.       Vision     Perception     Praxis      Pertinent Vitals/Pain Pain Assessment: No/denies pain     Hand Dominance     Extremity/Trunk Assessment Upper Extremity Assessment Upper Extremity Assessment: Generalized weakness   Lower Extremity Assessment Lower Extremity Assessment: Defer to PT evaluation  Communication Communication Communication: No difficulties   Cognition Arousal/Alertness: Awake/alert Behavior During Therapy: WFL for tasks assessed/performed Overall Cognitive Status: Within Functional Limits for tasks assessed (oriented to person, place and reason for admit and knew Dec 2016 and Christmas was coming up soon)                     General Comments       Exercises Exercises: Other exercises Other Exercises Other Exercises: Seated: heel raises, toe raises, LAQ, and hip flexion x15 reps each Other Exercises: sit<>stand x 5 reps for LE strengthening.   Shoulder Instructions      Home Living Family/patient expects to be discharged to:: Private residence Living Arrangements: Alone Available Help at Discharge: Family Type of Home: House Home Access: Stairs to enter Secretary/administratorntrance Stairs-Number of Steps: 3 Entrance  Stairs-Rails: Right Home Layout: One level     Bathroom Shower/Tub: Tub/shower unit Shower/tub characteristics: Door FirefighterBathroom Toilet: Standard     Home Equipment: None          Prior Functioning/Environment Level of Independence: Independent        Comments: Has a life line that she wears around her neck, but did not remember to activate it when she fell and could not get up.    OT Diagnosis: Generalized weakness   OT Problem List: Decreased activity tolerance;Impaired balance (sitting and/or standing);Decreased safety awareness   OT Treatment/Interventions: Self-care/ADL training;Therapeutic exercise;Therapeutic activities;Patient/family education    OT Goals(Current goals can be found in the care plan section) Acute Rehab OT Goals Patient Stated Goal: "I am not sure where I want to go after here but I want to do things for myself again" OT Goal Formulation: With patient Time For Goal Achievement: 01/17/15 Potential to Achieve Goals: Good  OT Frequency: Min 1X/week   Barriers to D/C:    lives at home alone       Co-evaluation              End of Session Equipment Utilized During Treatment: Gait belt Nurse Communication:  (clearance for partiicipation in eval)  Activity Tolerance: Patient limited by fatigue Patient left: in chair;with call bell/phone within reach;with chair alarm set   Time: [id number] OT Time Calculation (min): 24 min Charges:  OT General Charges $OT Visit: 1 Procedure OT Evaluation $Initial OT Evaluation Tier I: 1 Procedure OT Treatments $Self Care/Home Management : 8-22 mins G-Codes:    Morey Andonian 01/03/2015, 3:26 PM  Susanne BordersSusan Nattie Lazenby, OTR/L ascom [phone]

## 2015-01-03 NOTE — Progress Notes (Signed)
[facility] [place] [facility]   PATIENT NAME: Jenna Robinson    MR#:  [id number]  DATE OF BIRTH:  Dec 06, 1925  SUBJECTIVE:  CHIEF COMPLAINT:   Chief Complaint  Patient presents with  . Fall   - overall improving, still has significant weakness - CK elevated  REVIEW OF SYSTEMS:  Review of Systems  Constitutional: Negative for fever and chills.  HENT: Negative for ear discharge, ear pain and nosebleeds.   Eyes: Negative for blurred vision.  Respiratory: Negative for cough, shortness of breath and wheezing.   Cardiovascular: Negative for chest pain, palpitations and leg swelling.  Gastrointestinal: Negative for nausea, vomiting, abdominal pain, diarrhea and constipation.  Genitourinary: Negative for dysuria.  Musculoskeletal: Negative for myalgias.  Skin: Negative for rash.       Laceration on back of head with staples  Neurological: Negative for dizziness, sensory change, speech change, focal weakness, seizures, weakness and headaches.  Psychiatric/Behavioral: Negative for depression.    DRUG ALLERGIES:  No Known Allergies  VITALS:  Blood pressure 103/39, pulse 63, temperature 97.8 F (36.6 C), temperature source Oral, resp. rate 18, height  (1.803 m), weight 74.435 kg (164 lb 1.6 oz), SpO2 96 %.  PHYSICAL EXAMINATION:  Physical Exam  GENERAL:  81 y.o.-year-old patient sitting in the chair, with no acute distress.  EYES: Pupils equal, round, reactive to light and accommodation. No scleral icterus. Extraocular muscles intact.  HEENT: Head atraumatic, normocephalic. Oropharynx and nasopharynx clear.  NECK:  Supple, no jugular venous distention. No thyroid enlargement, no tenderness.  LUNGS: Normal breath sounds bilaterally, no wheezing, rales,rhonchi or crepitation. No use of accessory muscles of respiration.  CARDIOVASCULAR: S1, S2 normal. No  rubs, or gallops. 3/6 systolic murmur present ABDOMEN: Soft, nontender, nondistended. Bowel  sounds present. No organomegaly or mass.  EXTREMITIES: No pedal edema, cyanosis, or clubbing.  NEUROLOGIC: Cranial nerves II through XII are intact. Muscle strength 5/5 in all extremities. Sensation intact. Gait not checked.  PSYCHIATRIC: The patient is alert and oriented x 1-2.  SKIN: No obvious rash, lesion, or ulcer.    LABORATORY PANEL:   CBC  Recent Labs Lab 01/03/15 0532  WBC 6.3  HGB 11.4*  HCT 34.9*  PLT 146*   ------------------------------------------------------------------------------------------------------------------  Chemistries   Recent Labs Lab 01/03/15 0532  NA 139  K 3.3*  CL 106  CO2 27  GLUCOSE 100*  BUN 10  CREATININE 0.51  CALCIUM 9.7   ------------------------------------------------------------------------------------------------------------------  Cardiac Enzymes  Recent Labs Lab 01/02/15 1352  TROPONINI 0.57*   ------------------------------------------------------------------------------------------------------------------  RADIOLOGY:  Dg Chest 2 View  01/01/2015  CLINICAL DATA:  Syncope EXAM: CHEST  2 VIEW COMPARISON:  04/10/2011 FINDINGS: Normal heart size and mediastinal contours. No acute infiltrate or edema. No effusion or pneumothorax. No acute osseous findings. IMPRESSION: No active cardiopulmonary disease or change from 2013. Electronically Signed   By: Marnee Spring M.D.   On: 01/01/2015 22:56   Ct Head Wo Contrast  01/01/2015  CLINICAL DATA:  81 year old female with head trauma EXAM: CT HEAD WITHOUT CONTRAST TECHNIQUE: Contiguous axial images were obtained from the base of the skull through the vertex without intravenous contrast. COMPARISON:  CT dated 01/10/13 FINDINGS: The ventricles are dilated and the sulci are prominent compatible with age-related atrophy. Periventricular and deep white matter hypodensities represent chronic microvascular ischemic changes. Small old left basal ganglia lacunar infarct. There is no  intracranial hemorrhage. No mass effect or midline shift identified. The visualized paranasal sinuses and mastoid air  cells are well aerated. The calvarium is intact. Right posterior occipital scalp hematoma. IMPRESSION: No acute intracranial hemorrhage. Age-related atrophy and chronic microvascular ischemic disease. Electronically Signed   By: Elgie CollardArash  Radparvar M.D.   On: 01/01/2015 20:35   Dg Hip Unilat With Pelvis 2-3 Views Right  01/01/2015  CLINICAL DATA:  Larey SeatFell today.  Right hip pain. EXAM: DG HIP (WITH OR WITHOUT PELVIS) 2-3V RIGHT COMPARISON:  None. FINDINGS: Both hips are normally located. Mild degenerative changes. No acute hip fracture. The pubic symphysis and SI joints are intact. No pelvic fractures. Scoliosis and degenerative changes noted in the lower lumbar spine. IMPRESSION: No acute hip fracture. Electronically Signed   By: Rudie MeyerP.  Gallerani M.D.   On: 01/01/2015 20:49    EKG:   Orders placed or performed during the hospital encounter of 01/01/15  . ED EKG  . ED EKG  . EKG 12-Lead  . 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  #1 fall and scalp laceration-patient states she had weakness in her legs suddenly, the gave out and she had the fall., Denies any chest pain or loss of consciousness. She did hit her head and has a laceration that has been stapled. -CT of the head does not reveal any intracranial bleed. -Physical therapy consulted and they have recommended rehabilitation.  #2 rhabdomyolysis-secondary to the fall. Gentle hydration and recheck CPK tomorrow a.m. - CPK improving  #3 hematuria-actually her urine analysis reveals 2+ hemoglobin but less than 5 RBCs.  -that could be secondary to myoglobinuria from her rhabdo -No  hematuria in the hospital he here. Further workup as outpatient if this recurs  #4 elevated troponin-no chest pain, no prior cardiac history. -Could be from rhabdo, ECHO with no wall motion abnormality, EF 55%. - hold off on cardiology consult -Troponins have plateaued.   #5 hypertension-continue atenolol and lisinopril  #6 diabetes mellitus-on sliding scale insulin. Also on Levemir.  #7 DVT prophylaxis-on subcutaneous heparin  #8Hypokalemia- replaced  Possible discharge to home health vs rehab in 1-2 days   All the records are reviewed and case discussed with Care Management/Social Workerr. Management plans discussed with the patient, family and they are in agreement.  CODE STATUS: Full Code  TOTAL TIME TAKING CARE OF THIS PATIENT: 38 minutes.   POSSIBLE D/C IN 1-2 DAYS, DEPENDING ON CLINICAL CONDITION.   Enid BaasKALISETTI,Shay Bartoli M.D on 01/03/2015 at 2:57 PM  Between 7am to 6pm - Pager - [phone]  After 6pm go to [email or web address] - password [email or web address] [facility]  [facility]  Office  [phone]  CC: Primary care physician; Mickel FuchsWROTH, THOMAS H, MD

## 2015-01-03 NOTE — Progress Notes (Signed)
Per [facility] policy diphenydramine 25 mg capsule PRN for sleep has been interchanged to zolpidem 5 mg PRN for sleep.   Desera Graffeo A. Beaverookson, VermontPharm.D., BCPS Clinical Pharmacist 01/03/15 2245

## 2015-01-03 NOTE — Progress Notes (Signed)
Physical Therapy Treatment Patient Details Name: Jenna PhilipsBarbara Vernell Robinson MRN: [id number] DOB: 02-11-1925 Today's Date: 01/03/2015    History of Present Illness The patient presents emergency department after suffering a fall she sustained a head laceration. The patient denies loss of consciousness. She admits that her legs became weak. Her head hit the floor but she denies headache. Notably, the patient has a past history significant for dementia. Pt with elevated troponin.    PT Comments    Pt showing progress with ambulation this session but continues to demonstrate decreased safety with sit<>stand transfers and has LE weakness affecting her functional mobility.  Pt has overall decreased awareness of her limitations with decreased recall of safe transfer techniques.  Rec. of SNF continues to remain appropriate for pt in order for eventual safe return home/prior living arrangement.   Follow Up Recommendations  SNF     Equipment Recommendations  Rolling walker with 5" wheels    Recommendations for Other Services       Precautions / Restrictions Precautions Precautions: Fall Restrictions Weight Bearing Restrictions: No    Mobility  Bed Mobility Overal bed mobility: Modified Independent             General bed mobility comments: Extra time and bed rails; verbal cues to scoot up in bed  Transfers Overall transfer level: Needs assistance Equipment used: Rolling walker (2 wheeled) Transfers: Sit to/from Stand Sit to Stand: Min guard         General transfer comment: Practiced sit<>stand transfer with RW x 5 reps.  Pt continues to pull on RW with B UE and needs verbal and tactile re-direction to push from chair.  Pt continues to be unsafe with transfers using RW.  Ambulation/Gait Ambulation/Gait assistance: Supervision Ambulation Distance (Feet): 175 Feet Assistive device: Rolling walker (2 wheeled) Gait Pattern/deviations: Step-through pattern     General Gait  Details: Decreased cadence with step-through gait pattern.  Gait steady this session with no balance deviations, just limited by general fatigue.   Stairs            Wheelchair Mobility    Modified Rankin (Stroke Patients Only)       Balance                                    Cognition Arousal/Alertness: Awake/alert Behavior During Therapy: WFL for tasks assessed/performed Overall Cognitive Status: Within Functional Limits for tasks assessed                      Exercises Other Exercises Other Exercises: Seated: heel raises, toe raises, LAQ, and hip flexion x15 reps each Other Exercises: sit<>stand x 5 reps for LE strengthening.    General Comments        Pertinent Vitals/Pain Pain Assessment: No/denies pain    Home Living                      Prior Function            PT Goals (current goals can now be found in the care plan section) Acute Rehab PT Goals Patient Stated Goal: "I rather go to a rest home than live with family." PT Goal Formulation: With patient Time For Goal Achievement: 01/09/15 Potential to Achieve Goals: Fair Progress towards PT goals: Progressing toward goals    Frequency  Min 2X/week    PT Plan Current plan remains appropriate  Co-evaluation             End of Session Equipment Utilized During Treatment: Gait belt Activity Tolerance: Patient tolerated treatment well Patient left: in bed;with bed alarm set;with family/visitor present     Time: 1130-1155 PT Time Calculation (min) (ACUTE ONLY): 25 min  Charges:  $Gait Training: 8-22 mins $Therapeutic Exercise: 8-22 mins                    G Codes:      Raphaela Cannaday A Hevin Jeffcoat 07-Jan-2015, 12:02 PM

## 2015-01-03 NOTE — Progress Notes (Signed)
Patient changed to inpatient. Clinical Child psychotherapistocial Worker (CSW) made [facility] [facility] aware of status change. Patient will need a 3 night qualifying inpatient stay in order for Medicare to pay for rehab. If patient is medically stable before meeting that criteria patient will have to pay privately for SNF or return home with home health. CSW will continue to follow and assist as needed.   Jetta LoutBailey Morgan, LCSWA [phone]

## 2015-01-04 LAB — GLUCOSE, CAPILLARY
Glucose-Capillary: 165 mg/dL — ABNORMAL HIGH (ref 65–99)
Glucose-Capillary: 200 mg/dL — ABNORMAL HIGH (ref 65–99)
Glucose-Capillary: 163 mg/dL — ABNORMAL HIGH (ref 65–99)
Glucose-Capillary: 192 mg/dL — ABNORMAL HIGH (ref 65–99)

## 2015-01-04 LAB — CBC
HCT: 37 % (ref 35.0–47.0)
Hemoglobin: 12.3 g/dL (ref 12.0–16.0)
MCH: 30.4 pg (ref 26.0–34.0)
MCHC: 33.4 g/dL (ref 32.0–36.0)
MCV: 91.1 fL (ref 80.0–100.0)
PLATELETS: 160 10*3/uL (ref 150–440)
RBC: 4.06 MIL/uL (ref 3.80–5.20)
RDW: 13.7 % (ref 11.5–14.5)
WBC: 8.4 10*3/uL (ref 3.6–11.0)

## 2015-01-04 LAB — BASIC METABOLIC PANEL
Anion gap: 4 — ABNORMAL LOW (ref 5–15)
BUN: 11 mg/dL (ref 6–20)
CALCIUM: 10 mg/dL (ref 8.9–10.3)
CO2: 26 mmol/L (ref 22–32)
CREATININE: 0.5 mg/dL (ref 0.44–1.00)
Chloride: 105 mmol/L (ref 101–111)
GFR calc non Af Amer: >60 mL/min (ref >60)
Glucose, Bld: 166 mg/dL — ABNORMAL HIGH (ref 65–99)
Potassium: 4 mmol/L (ref 3.5–5.1)
SODIUM: 135 mmol/L (ref 135–145)

## 2015-01-04 LAB — CK: CK TOTAL: 548 U/L — AB (ref 38–234)

## 2015-01-04 MED ORDER — INSULIN DETEMIR 100 UNIT/ML FLEXPEN: 16.0000 [IU] | Freq: Every day | SUBCUTANEOUS | Status: DC

## 2015-01-04 NOTE — Care Management Important Message (Signed)
Important Message  Patient Details  Name: Alvira PhilipsBarbara Vernell Tardif MRN: [id number] Date of Birth: 1925/09/06   Medicare Important Message Given:  Yes    Olegario MessierKathy A Deshunda Thackston 01/04/2015, 9:50 AM

## 2015-01-04 NOTE — Discharge Summary (Addendum)
[facility] [place] NAME: Jenna Robinson    MR#:  [id number]  DATE OF BIRTH:  [birth year]  DATE OF ADMISSION:  01/01/2015 ADMITTING PHYSICIAN: Harrie Foreman, MD  DATE OF DISCHARGE: 01/05/15  PRIMARY CARE PHYSICIAN: Elba Barman, MD    ADMISSION DIAGNOSIS:  Weakness [R53.1]  DISCHARGE DIAGNOSIS:  Active Problems:   Elevated troponin   Rhabdomyolysis   SECONDARY DIAGNOSIS:   Past Medical History  Diagnosis Date  . Diabetes mellitus without complication [facility])     HOSPITAL COURSE:   81 year old female with past medical history significant for dementia, diabetes mellitus, chronic low back pain with right-sided radiculopathy, hypertension, hyperlipidemia presents from home after she had a fall and noted to have scalp laceration.  #1 fall and scalp laceration-patient states she had weakness in her legs suddenly, that gave out and she had the fall., Denies any chest pain or loss of consciousness. She did hit her head and has a laceration that has been stapled. -CT of the head does not reveal any intracranial bleed. -Physical therapy consulted and they have recommended rehabilitation.  #2 rhabdomyolysis-secondary to the fall. Gentle hydration. - CPK improving - hold the statin for a couple of days and then atorvastatin changed to a lower dose due to her age.  #3 hematuria-actually her urine analysis reveals 2+ hemoglobin but less than 5 RBCs.  -that could be secondary to myoglobinuria from her rhabdo -No hematuria in the hospital he here. Further workup as outpatient if this recurs - repeat UA prior to discharge due to increased frequency of urination- could be from the IV fluids as well  #4 elevated troponin-no chest pain, no prior cardiac history. -Could be from rhabdo, ECHO with no wall motion abnormality, EF 55%. - hold off on cardiology consult -Troponins have plateaued.   #5 hypertension-continue atenolol and  lisinopril  #6 diabetes mellitus-decrease her levemir to 16 units as her sugars have been fine with that dose here.  May be she was having hypoglycemic episodes at home with 60 units of levemir? - Hba1c is only 5.9  #7Hypokalemia- replaced  #8 Dementia- oriented to place and situation. At baseline. pleasant  Patient worked with physical therapy and they have recommended rehab Likely discharge to rehab today   DISCHARGE CONDITIONS:   Stable  CONSULTS OBTAINED:    None DRUG ALLERGIES:  No Known Allergies  DISCHARGE MEDICATIONS:   Current Discharge Medication List    START taking these medications   Details  Nicotine 21-14-7 MG/24HR KIT Place 21 mg onto the skin daily. Qty: 30 each, Refills: 2      CONTINUE these medications which have CHANGED   Details  atorvastatin (LIPITOR) 20 MG tablet Take 1 tablet (20 mg total) by mouth daily. Qty: 30 tablet, Refills: 0    insulin detemir (LEVEMIR) 100 unit/ml SOLN Inject 0.16 mLs (16 Units total) into the skin daily. Qty: 100 mL, Refills: 0      CONTINUE these medications which have NOT CHANGED   Details  atenolol (TENORMIN) 25 MG tablet Take 3 tablets by mouth daily.    calcium-vitamin D (OSCAL WITH D) 500-200 MG-UNIT tablet Take 1 tablet by mouth 2 (two) times daily.    lisinopril (PRINIVIL,ZESTRIL) 10 MG tablet Take 1 tablet by mouth daily.    magnesium oxide (MAG-OX) 400 MG tablet Take 400 mg by mouth 2 (two) times daily.    metFORMIN (GLUMETZA) 500 MG (MOD) 24 hr tablet Take 2 tablets  by mouth 2 (two) times daily.         DISCHARGE INSTRUCTIONS:   1. PCP follow-up in 1-2 weeks 2. Physical therapy  If you experience worsening of your admission symptoms, develop shortness of breath, life threatening emergency, suicidal or homicidal thoughts you must seek medical attention immediately by calling 911 or calling your MD immediately  if symptoms less severe.  You Must read complete instructions/literature along  with all the possible adverse reactions/side effects for all the Medicines you take and that have been prescribed to you. Take any new Medicines after you have completely understood and accept all the possible adverse reactions/side effects.   Please note  You were cared for by a hospitalist during your hospital stay. If you have any questions about your discharge medications or the care you received while you were in the hospital after you are discharged, you can call the unit and asked to speak with the hospitalist on call if the hospitalist that took care of you is not available. Once you are discharged, your primary care physician will handle any further medical issues. Please note that NO REFILLS for any discharge medications will be authorized once you are discharged, as it is imperative that you return to your primary care physician (or establish a relationship with a primary care physician if you do not have one) for your aftercare needs so that they can reassess your need for medications and monitor your lab values.    Today   CHIEF COMPLAINT:   Chief Complaint  Patient presents with  . Fall     VITAL SIGNS:  Blood pressure 147/65, pulse 142, temperature 98.2 F (36.8 C), temperature source Oral, resp. rate 18, height _0  (1.803 m), weight 78.79 kg (173 lb 11.2 oz), SpO2 93 %.  I/O:   Intake/Output Summary (Last 24 hours) at 01/04/15 1118 Last data filed at 01/04/15 1032  Gross per 24 hour  Intake 5058.33 ml  Output   3400 ml  Net 1658.33 ml    PHYSICAL EXAMINATION:   Physical Exam  GENERAL: 81 y.o.-year-old patient sitting in the chair, with no acute distress.  EYES: Pupils equal, round, reactive to light and accommodation. No scleral icterus. Extraocular muscles intact.  HEENT: Head atraumatic, normocephalic. Oropharynx and nasopharynx clear.  NECK: Supple, no jugular venous distention. No thyroid enlargement, no tenderness.  LUNGS: Normal breath sounds  bilaterally, no wheezing, rales,rhonchi or crepitation. No use of accessory muscles of respiration.  CARDIOVASCULAR: S1, S2 normal. No rubs, or gallops. 3/6 systolic murmur present ABDOMEN: Soft, nontender, nondistended. Bowel sounds present. No organomegaly or mass.  EXTREMITIES: No pedal edema, cyanosis, or clubbing.  NEUROLOGIC: Cranial nerves II through XII are intact. Muscle strength 5/5 in all extremities. Sensation intact. Gait not checked.  PSYCHIATRIC: The patient is alert and oriented x 2.  SKIN: No obvious rash, lesion, or ulcer.   DATA REVIEW:   CBC  Recent Labs Lab 01/04/15 0541  WBC 8.4  HGB 12.3  HCT 37.0  PLT 160    Chemistries   Recent Labs Lab 01/04/15 0541  NA 135  K 4.0  CL 105  CO2 26  GLUCOSE 166*  BUN 11  CREATININE 0.50  CALCIUM 10.0    Cardiac Enzymes  Recent Labs Lab 01/02/15 1352  TROPONINI 0.57*    Microbiology Results  No results found for this or any previous visit.  RADIOLOGY:  No results found.  EKG:   Orders placed or performed during the hospital encounter  of 01/01/15  . ED EKG  . ED EKG  . EKG 12-Lead  . EKG 12-Lead      Management plans discussed with the patient, family and they are in agreement.  CODE STATUS:     Code Status Orders        Start     Ordered   01/02/15 0158  Full code   Continuous     01/02/15 0157    Advance Directive Documentation        Most Recent Value   Type of Advance Directive  Living will   Pre-existing out of facility DNR order (yellow form or pink MOST form)     "MOST" Form in Place?        TOTAL TIME TAKING CARE OF THIS PATIENT: 37 minutes.    Gladstone Lighter M.D on 01/04/2015 at 11:18 AM  Between 7am to 6pm - Pager - [phone]  After 6pm go to [email or web address] - password [email or web address] [facility]  Office  [phone]  CC: Primary care physician; Elba Barman, MD

## 2015-01-04 NOTE — Care Management (Signed)
CK >500. IVF at 3175ml/hr. SNF placement pending when medically clear- hopeful CK will be WNL and patient can go to SNF tomorrow. Orders needed today for SNF to prepare for tomorrow discharge.

## 2015-01-04 NOTE — Progress Notes (Signed)
[facility] [place] at [facility]   PATIENT NAME: Jenna PopBarbara Robinson    MR#:  [id number]  DATE OF BIRTH:  [birth year]  SUBJECTIVE:  CHIEF COMPLAINT:   Chief Complaint  Patient presents with  . Fall   - overall improving, CK slowly improving - discharge to rehab possibly tomorrow  REVIEW OF SYSTEMS:  Review of Systems  Constitutional: Negative for fever and chills.  HENT: Negative for ear discharge, ear pain and nosebleeds.   Eyes: Negative for blurred vision.  Respiratory: Negative for cough, shortness of breath and wheezing.   Cardiovascular: Negative for chest pain, palpitations and leg swelling.  Gastrointestinal: Negative for nausea, vomiting, abdominal pain, diarrhea and constipation.  Genitourinary: Negative for dysuria.  Musculoskeletal: Negative for myalgias.  Skin: Negative for rash.       Laceration on back of head with staples  Neurological: Negative for dizziness, sensory change, speech change, focal weakness, seizures, weakness and headaches.  Psychiatric/Behavioral: Negative for depression.    DRUG ALLERGIES:  No Known Allergies  VITALS:  Blood pressure 147/65, pulse 142, temperature 98.2 F (36.8 C), temperature source Oral, resp. rate 18, height 5\' 11"  (1.803 m), weight 78.79 kg (173 lb 11.2 oz), SpO2 93 %.  PHYSICAL EXAMINATION:  Physical Exam  GENERAL:  81 y.o.-year-old patient sitting in the chair, with no acute distress.  EYES: Pupils equal, round, reactive to light and accommodation. No scleral icterus. Extraocular muscles intact.  HEENT: Head atraumatic, normocephalic. Oropharynx and nasopharynx clear.  NECK:  Supple, no jugular venous distention. No thyroid enlargement, no tenderness.  LUNGS: Normal breath sounds bilaterally, no wheezing, rales,rhonchi or crepitation. No use of accessory muscles of respiration.  CARDIOVASCULAR: S1, S2 normal. No  rubs, or gallops. 3/6 systolic murmur present ABDOMEN: Soft, nontender,  nondistended. Bowel sounds present. No organomegaly or mass.  EXTREMITIES: No pedal edema, cyanosis, or clubbing.  NEUROLOGIC: Cranial nerves II through XII are intact. Muscle strength 5/5 in all extremities. Sensation intact. Gait not checked.  PSYCHIATRIC: The patient is alert and oriented x  2.  SKIN: No obvious rash, lesion, or ulcer.    LABORATORY PANEL:   CBC  Recent Labs Lab 01/04/15 0541  WBC 8.4  HGB 12.3  HCT 37.0  PLT 160   ------------------------------------------------------------------------------------------------------------------  Chemistries   Recent Labs Lab 01/04/15 0541  NA 135  K 4.0  CL 105  CO2 26  GLUCOSE 166*  BUN 11  CREATININE 0.50  CALCIUM 10.0   ------------------------------------------------------------------------------------------------------------------  Cardiac Enzymes  Recent Labs Lab 01/02/15 1352  TROPONINI 0.57*   ------------------------------------------------------------------------------------------------------------------  RADIOLOGY:  No results found.  EKG:   Orders placed or performed during the hospital encounter of 01/01/15  . ED EKG  . ED EKG  . EKG 12-Lead  . 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  #1 fall and scalp laceration-patient states she had weakness in her legs suddenly, that gave out and she had the fall., Denies any chest pain or loss of consciousness. She did hit her head and has a laceration that has been stapled. -CT of the head does not reveal any intracranial bleed. -Physical therapy consulted and they have recommended rehabilitation.  #2 rhabdomyolysis-secondary to the fall. Gentle hydration. - CPK improving  #3 hematuria-actually her  urine analysis reveals 2+  hemoglobin but less than 5 RBCs.  -that could be secondary to myoglobinuria from her rhabdo -No hematuria in the hospital he here. Further workup as outpatient if this recurs  #4 elevated troponin-no chest pain, no prior cardiac history. -Could be from rhabdo, ECHO with no wall motion abnormality, EF 55%. - hold off on cardiology consult -Troponins have plateaued.   #5 hypertension-continue atenolol and lisinopril  #6 diabetes mellitus-on sliding scale insulin. Also on Levemir.  #7 DVT prophylaxis-on subcutaneous heparin  #8Hypokalemia- replaced  Possible discharge to  rehab tomorrow  All the records are reviewed and case discussed with Care Management/Social Workerr. Management plans discussed with the patient, family and they are in agreement.  CODE STATUS: Full Code  TOTAL TIME TAKING CARE OF THIS PATIENT: 38 minutes.   POSSIBLE D/C TOMORROW, DEPENDING ON CLINICAL CONDITION.   [name] M.D on 01/04/2015 at 11:14 AM  Between 7am to 6pm - Pager - [phone]  After 6pm go to [email or web address] - password [email or web address] [facility]  [facility]  Office  [phone]  CC: Primary care physician; Mickel Fuchs, MD

## 2015-01-05 LAB — GLUCOSE, CAPILLARY
Glucose-Capillary: 141 mg/dL — ABNORMAL HIGH (ref 65–99)
Glucose-Capillary: 244 mg/dL — ABNORMAL HIGH (ref 65–99)
Glucose-Capillary: 265 mg/dL — ABNORMAL HIGH (ref 65–99)

## 2015-01-05 LAB — URINALYSIS COMPLETE WITH MICROSCOPIC (ARMC ONLY)
BILIRUBIN URINE: NEGATIVE
Bacteria, UA: NONE SEEN
KETONES UR: NEGATIVE mg/dL
LEUKOCYTES UA: NEGATIVE
NITRITE: NEGATIVE
PH: 7 (ref 5.0–8.0)
Protein, ur: NEGATIVE mg/dL
SPECIFIC GRAVITY, URINE: 1.01 (ref 1.005–1.030)
Squamous Epithelial / LPF: NONE SEEN

## 2015-01-05 MED ORDER — ATORVASTATIN CALCIUM 20 MG PO TABS: 20.0000 mg | ORAL_TABLET | Freq: Every day | ORAL | Status: DC

## 2015-01-05 MED ORDER — NICOTINE 21-14-7 MG/24HR TD KIT: 21.0000 mg | PACK | Freq: Every day | TRANSDERMAL | Status: DC

## 2015-01-05 NOTE — Clinical Social Work Placement (Signed)
   CLINICAL SOCIAL WORK PLACEMENT  NOTE  Date:  01/05/2015  Patient Details  Name: Jenna PhilipsBarbara Vernell Kovatch MRN: [id number] Date of Birth: [birth year]  Clinical Social Work is seeking post-discharge placement for this patient at the Skilled  Nursing Facility level of care (*CSW will initial, date and re-position this form in  chart as items are completed):  Yes   Patient/family provided with [facility] Clinical Social Work Department's list of facilities offering this level of care within the geographic area requested by the patient (or if unable, by the patient's family).  Yes   Patient/family informed of their freedom to choose among providers that offer the needed level of care, that participate in Medicare, Medicaid or managed care program needed by the patient, have an available bed and are willing to accept the patient.  Yes   Patient/family informed of [facility]'s ownership interest in [facility] and [facility], as well as of the fact that they are under no obligation to receive care at these facilities.  PASRR submitted to EDS on 01/02/15     PASRR number received on 01/02/15     Existing PASRR number confirmed on       FL2 transmitted to all facilities in geographic area requested by pt/family on 01/02/15     FL2 transmitted to all facilities within larger geographic area on       Patient informed that his/her managed care company has contracts with or will negotiate with certain facilities, including the following:        Yes ([facility]- Private Pay)   Patient/family informed of bed offers received.  Patient chooses bed at       Physician recommends and patient chooses bed at      Patient to be transferred to  [facility] on 01/05/15.  Patient to be transferred to facility by  (Niece-- Almyra FreeLibby)     Patient family notified on 01/05/15 of transfer.  Name of family member notified:   ([facility])     PHYSICIAN       Additional Comment:     _______________________________________________ Soundra PilonMoore, Leler Brion H, LCSW 01/05/2015, 11:02 AM

## 2015-01-05 NOTE — Progress Notes (Signed)
Clinical Social Worker informed by Enid BaasKALISETTI,RADHIKA, MD that patient is medically ready to discharge to SNF, Patient and niece Almyra FreeLibby are in a agreement with plan.  [facility]'s bed is ready. Provided patient's room number 209 and number to call for report [phone] . All discharge information faxed to  Facility. Rx's added to discharge packet.   RN will call report and patient will [facility] to [facility] via Family after lunch.  Sammuel Hineseborah Hector Taft. LCSWA Clinical Social Work Department [phone] 11:08 AM

## 2015-01-07 LAB — URINALYSIS COMPLETE WITH MICROSCOPIC (ARMC ONLY)
BACTERIA UA: NONE SEEN
Bilirubin Urine: NEGATIVE
GLUCOSE, UA: 50 mg/dL — AB
Ketones, ur: NEGATIVE mg/dL
Nitrite: NEGATIVE
PH: 5 (ref 5.0–8.0)
Protein, ur: NEGATIVE mg/dL
SPECIFIC GRAVITY, URINE: 1.021 (ref 1.005–1.030)
Squamous Epithelial / LPF: NONE SEEN

## 2015-01-07 LAB — GLUCOSE, CAPILLARY
GLUCOSE-CAPILLARY: 190 mg/dL — AB (ref 65–99)
GLUCOSE-CAPILLARY: 266 mg/dL — AB (ref 65–99)
GLUCOSE-CAPILLARY: 243 mg/dL — AB (ref 65–99)
GLUCOSE-CAPILLARY: 233 mg/dL — AB (ref 65–99)
GLUCOSE-CAPILLARY: 241 mg/dL — AB (ref 65–99)
Glucose-Capillary: 197 mg/dL — ABNORMAL HIGH (ref 65–99)

## 2015-01-08 LAB — GLUCOSE, CAPILLARY
GLUCOSE-CAPILLARY: 209 mg/dL — AB (ref 65–99)
GLUCOSE-CAPILLARY: 182 mg/dL — AB (ref 65–99)
Glucose-Capillary: 167 mg/dL — ABNORMAL HIGH (ref 65–99)
Glucose-Capillary: 207 mg/dL — ABNORMAL HIGH (ref 65–99)

## 2015-01-09 LAB — GLUCOSE, CAPILLARY: GLUCOSE-CAPILLARY: 221 mg/dL — AB (ref 65–99)

## 2015-01-09 LAB — URINE CULTURE

## 2015-01-10 LAB — GLUCOSE, CAPILLARY
GLUCOSE-CAPILLARY: 220 mg/dL — AB (ref 65–99)
GLUCOSE-CAPILLARY: 157 mg/dL — AB (ref 65–99)
GLUCOSE-CAPILLARY: 258 mg/dL — AB (ref 65–99)
GLUCOSE-CAPILLARY: 164 mg/dL — AB (ref 65–99)
GLUCOSE-CAPILLARY: 146 mg/dL — AB (ref 65–99)
Glucose-Capillary: 185 mg/dL — ABNORMAL HIGH (ref 65–99)
Glucose-Capillary: 180 mg/dL — ABNORMAL HIGH (ref 65–99)

## 2015-01-12 LAB — GLUCOSE, CAPILLARY
GLUCOSE-CAPILLARY: 170 mg/dL — AB (ref 65–99)
GLUCOSE-CAPILLARY: 115 mg/dL — AB (ref 65–99)
GLUCOSE-CAPILLARY: 152 mg/dL — AB (ref 65–99)
Glucose-Capillary: 176 mg/dL — ABNORMAL HIGH (ref 65–99)
Glucose-Capillary: 112 mg/dL — ABNORMAL HIGH (ref 65–99)

## 2015-01-13 LAB — GLUCOSE, CAPILLARY
GLUCOSE-CAPILLARY: 149 mg/dL — AB (ref 65–99)
GLUCOSE-CAPILLARY: 186 mg/dL — AB (ref 65–99)
Glucose-Capillary: 163 mg/dL — ABNORMAL HIGH (ref 65–99)
Glucose-Capillary: 205 mg/dL — ABNORMAL HIGH (ref 65–99)

## 2015-01-14 ENCOUNTER — Encounter
Admission: RE | Admit: 2015-01-14 | Discharge: 2015-01-14 | Disposition: A | Discharge status: Home / Self Care | Payer: Medicare Other | Source: Ambulatory Visit | Attending: Internal Medicine | Admitting: Internal Medicine

## 2015-01-14 DIAGNOSIS — E119 Type 2 diabetes mellitus without complications: Secondary | ICD-10-CM | POA: Insufficient documentation

## 2015-01-14 DIAGNOSIS — Z794 Long term (current) use of insulin: Secondary | ICD-10-CM | POA: Insufficient documentation

## 2015-01-14 LAB — GLUCOSE, CAPILLARY
GLUCOSE-CAPILLARY: 94 mg/dL (ref 65–99)
GLUCOSE-CAPILLARY: 185 mg/dL — AB (ref 65–99)
GLUCOSE-CAPILLARY: 170 mg/dL — AB (ref 65–99)
Glucose-Capillary: 143 mg/dL — ABNORMAL HIGH (ref 65–99)
Glucose-Capillary: 114 mg/dL — ABNORMAL HIGH (ref 65–99)

## 2015-01-15 LAB — GLUCOSE, CAPILLARY
GLUCOSE-CAPILLARY: 157 mg/dL — AB (ref 65–99)
Glucose-Capillary: 166 mg/dL — ABNORMAL HIGH (ref 65–99)
Glucose-Capillary: 207 mg/dL — ABNORMAL HIGH (ref 65–99)

## 2015-01-16 LAB — GLUCOSE, CAPILLARY
GLUCOSE-CAPILLARY: 135 mg/dL — AB (ref 65–99)
GLUCOSE-CAPILLARY: 142 mg/dL — AB (ref 65–99)
GLUCOSE-CAPILLARY: 176 mg/dL — AB (ref 65–99)
GLUCOSE-CAPILLARY: 137 mg/dL — AB (ref 65–99)
Glucose-Capillary: 116 mg/dL — ABNORMAL HIGH (ref 65–99)
Glucose-Capillary: 119 mg/dL — ABNORMAL HIGH (ref 65–99)
Glucose-Capillary: 165 mg/dL — ABNORMAL HIGH (ref 65–99)
Glucose-Capillary: 224 mg/dL — ABNORMAL HIGH (ref 65–99)

## 2015-01-18 LAB — GLUCOSE, CAPILLARY
GLUCOSE-CAPILLARY: 143 mg/dL — AB (ref 65–99)
GLUCOSE-CAPILLARY: 141 mg/dL — AB (ref 65–99)
GLUCOSE-CAPILLARY: 134 mg/dL — AB (ref 65–99)
Glucose-Capillary: 158 mg/dL — ABNORMAL HIGH (ref 65–99)
Glucose-Capillary: 182 mg/dL — ABNORMAL HIGH (ref 65–99)
Glucose-Capillary: 204 mg/dL — ABNORMAL HIGH (ref 65–99)
Glucose-Capillary: 150 mg/dL — ABNORMAL HIGH (ref 65–99)

## 2015-01-19 LAB — GLUCOSE, CAPILLARY
GLUCOSE-CAPILLARY: 166 mg/dL — AB (ref 65–99)
GLUCOSE-CAPILLARY: 140 mg/dL — AB (ref 65–99)
Glucose-Capillary: 135 mg/dL — ABNORMAL HIGH (ref 65–99)
Glucose-Capillary: 110 mg/dL — ABNORMAL HIGH (ref 65–99)

## 2015-01-21 LAB — GLUCOSE, CAPILLARY
GLUCOSE-CAPILLARY: 168 mg/dL — AB (ref 65–99)
Glucose-Capillary: 149 mg/dL — ABNORMAL HIGH (ref 65–99)
Glucose-Capillary: 112 mg/dL — ABNORMAL HIGH (ref 65–99)
Glucose-Capillary: 144 mg/dL — ABNORMAL HIGH (ref 65–99)
Glucose-Capillary: 135 mg/dL — ABNORMAL HIGH (ref 65–99)

## 2015-01-22 LAB — GLUCOSE, CAPILLARY
GLUCOSE-CAPILLARY: 193 mg/dL — AB (ref 65–99)
GLUCOSE-CAPILLARY: 172 mg/dL — AB (ref 65–99)
GLUCOSE-CAPILLARY: 196 mg/dL — AB (ref 65–99)
Glucose-Capillary: 151 mg/dL — ABNORMAL HIGH (ref 65–99)
Glucose-Capillary: 172 mg/dL — ABNORMAL HIGH (ref 65–99)

## 2015-01-23 LAB — GLUCOSE, CAPILLARY
GLUCOSE-CAPILLARY: 215 mg/dL — AB (ref 65–99)
GLUCOSE-CAPILLARY: 301 mg/dL — AB (ref 65–99)
Glucose-Capillary: 242 mg/dL — ABNORMAL HIGH (ref 65–99)
Glucose-Capillary: 144 mg/dL — ABNORMAL HIGH (ref 65–99)

## 2015-05-22 ENCOUNTER — Inpatient Hospital Stay
Admission: EM | Admit: 2015-05-22 | Discharge: 2015-05-27 | DRG: 640 | Disposition: A | Discharge status: Home Health | Payer: Medicare Other | Attending: Internal Medicine | Admitting: Internal Medicine

## 2015-05-22 ENCOUNTER — Encounter: Payer: Self-pay | Admitting: *Deleted

## 2015-05-22 ENCOUNTER — Inpatient Hospital Stay: Payer: Medicare Other

## 2015-05-22 DIAGNOSIS — R2681 Unsteadiness on feet: Secondary | ICD-10-CM | POA: Diagnosis present

## 2015-05-22 DIAGNOSIS — F0390 Unspecified dementia without behavioral disturbance: Secondary | ICD-10-CM | POA: Diagnosis present

## 2015-05-22 DIAGNOSIS — I959 Hypotension, unspecified: Secondary | ICD-10-CM | POA: Diagnosis present

## 2015-05-22 DIAGNOSIS — E860 Dehydration: Secondary | ICD-10-CM | POA: Diagnosis present

## 2015-05-22 DIAGNOSIS — G9341 Metabolic encephalopathy: Secondary | ICD-10-CM | POA: Diagnosis present

## 2015-05-22 DIAGNOSIS — E11649 Type 2 diabetes mellitus with hypoglycemia without coma: Secondary | ICD-10-CM | POA: Diagnosis present

## 2015-05-22 DIAGNOSIS — Z794 Long term (current) use of insulin: Secondary | ICD-10-CM | POA: Diagnosis not present

## 2015-05-22 DIAGNOSIS — R531 Weakness: Secondary | ICD-10-CM

## 2015-05-22 DIAGNOSIS — Z66 Do not resuscitate: Secondary | ICD-10-CM | POA: Diagnosis present

## 2015-05-22 DIAGNOSIS — R0602 Shortness of breath: Secondary | ICD-10-CM

## 2015-05-22 DIAGNOSIS — Z8542 Personal history of malignant neoplasm of other parts of uterus: Secondary | ICD-10-CM

## 2015-05-22 DIAGNOSIS — Z79899 Other long term (current) drug therapy: Secondary | ICD-10-CM

## 2015-05-22 DIAGNOSIS — I10 Essential (primary) hypertension: Secondary | ICD-10-CM | POA: Diagnosis present

## 2015-05-22 DIAGNOSIS — N390 Urinary tract infection, site not specified: Secondary | ICD-10-CM | POA: Diagnosis present

## 2015-05-22 DIAGNOSIS — R4182 Altered mental status, unspecified: Secondary | ICD-10-CM

## 2015-05-22 DIAGNOSIS — Z7982 Long term (current) use of aspirin: Secondary | ICD-10-CM

## 2015-05-22 DIAGNOSIS — E876 Hypokalemia: Secondary | ICD-10-CM | POA: Diagnosis present

## 2015-05-22 HISTORY — DX: Essential (primary) hypertension: I10

## 2015-05-22 LAB — BASIC METABOLIC PANEL
ANION GAP: 10 (ref 5–15)
Anion gap: 7 (ref 5–15)
BUN: 22 mg/dL — AB (ref 6–20)
BUN: 19 mg/dL (ref 6–20)
CHLORIDE: 92 mmol/L — AB (ref 101–111)
CHLORIDE: 98 mmol/L — AB (ref 101–111)
CO2: 31 mmol/L (ref 22–32)
CO2: 27 mmol/L (ref 22–32)
Calcium: 14.3 mg/dL (ref 8.9–10.3)
Calcium: 12.3 mg/dL — ABNORMAL HIGH (ref 8.9–10.3)
Creatinine, Ser: 1.07 mg/dL — ABNORMAL HIGH (ref 0.44–1.00)
Creatinine, Ser: 0.84 mg/dL (ref 0.44–1.00)
GFR calc Af Amer: 53 mL/min — ABNORMAL LOW (ref >60)
GFR calc non Af Amer: 45 mL/min — ABNORMAL LOW (ref >60)
GFR calc non Af Amer: >60 mL/min (ref >60)
Glucose, Bld: 143 mg/dL — ABNORMAL HIGH (ref 65–99)
Glucose, Bld: 148 mg/dL — ABNORMAL HIGH (ref 65–99)
POTASSIUM: 2.9 mmol/L — AB (ref 3.5–5.1)
POTASSIUM: 4.1 mmol/L (ref 3.5–5.1)
SODIUM: 133 mmol/L — AB (ref 135–145)
SODIUM: 132 mmol/L — AB (ref 135–145)

## 2015-05-22 LAB — CBC
HEMATOCRIT: 44 % (ref 35.0–47.0)
HEMOGLOBIN: 14.9 g/dL (ref 12.0–16.0)
MCH: 29.7 pg (ref 26.0–34.0)
MCHC: 33.8 g/dL (ref 32.0–36.0)
MCV: 87.8 fL (ref 80.0–100.0)
PLATELETS: 267 10*3/uL (ref 150–440)
RBC: 5.01 MIL/uL (ref 3.80–5.20)
RDW: 13.7 % (ref 11.5–14.5)
WBC: 15.7 10*3/uL — AB (ref 3.6–11.0)

## 2015-05-22 LAB — URINALYSIS COMPLETE WITH MICROSCOPIC (ARMC ONLY)
BACTERIA UA: NONE SEEN
BILIRUBIN URINE: NEGATIVE
GLUCOSE, UA: 50 mg/dL — AB
NITRITE: NEGATIVE
PROTEIN: 100 mg/dL — AB
Specific Gravity, Urine: 1.015 (ref 1.005–1.030)
pH: 7 (ref 5.0–8.0)

## 2015-05-22 LAB — TROPONIN I: Troponin I: <0.03 ng/mL (ref <0.031)

## 2015-05-22 LAB — GLUCOSE, CAPILLARY
GLUCOSE-CAPILLARY: 123 mg/dL — AB (ref 65–99)
GLUCOSE-CAPILLARY: 152 mg/dL — AB (ref 65–99)

## 2015-05-22 LAB — CALCIUM: Calcium: 13.6 mg/dL (ref 8.9–10.3)

## 2015-05-22 MED ORDER — IOPAMIDOL (ISOVUE-300) INJECTION 61%
80.0000 mL | Freq: Once | INTRAVENOUS | Status: AC | PRN
  Administered 2015-05-22: 80 mL via INTRAVENOUS

## 2015-05-22 MED ORDER — ATORVASTATIN CALCIUM 20 MG PO TABS
20.0000 mg | ORAL_TABLET | Freq: Every day | ORAL | Status: DC
Start: 2015-05-22 — End: 2015-05-27
  Administered 2015-05-23 – 2015-05-27 (×5): 20 mg via ORAL
  Filled 2015-05-22 (×5): qty 1

## 2015-05-22 MED ORDER — INSULIN ASPART 100 UNIT/ML ~~LOC~~ SOLN
0.0000 [IU] | Freq: Three times a day (TID) | SUBCUTANEOUS | Status: DC
  Administered 2015-05-24: 1 [IU] via SUBCUTANEOUS
  Administered 2015-05-24: 2 [IU] via SUBCUTANEOUS
  Administered 2015-05-24: 1 [IU] via SUBCUTANEOUS
  Administered 2015-05-25: 5 [IU] via SUBCUTANEOUS
  Administered 2015-05-25: 2 [IU] via SUBCUTANEOUS
  Administered 2015-05-25: 1 [IU] via SUBCUTANEOUS
  Administered 2015-05-26 (×2): 3 [IU] via SUBCUTANEOUS
  Administered 2015-05-26: 2 [IU] via SUBCUTANEOUS
  Administered 2015-05-27: 3 [IU] via SUBCUTANEOUS
  Filled 2015-05-22: qty 2
  Filled 2015-05-22: qty 1
  Filled 2015-05-22 (×2): qty 3
  Filled 2015-05-22: qty 1
  Filled 2015-05-22 (×2): qty 2
  Filled 2015-05-22: qty 1
  Filled 2015-05-22: qty 3

## 2015-05-22 MED ORDER — TRAZODONE HCL 50 MG PO TABS
25.0000 mg | ORAL_TABLET | Freq: Every day | ORAL | Status: DC
  Administered 2015-05-22 – 2015-05-26 (×5): 25 mg via ORAL
  Filled 2015-05-22 (×5): qty 1

## 2015-05-22 MED ORDER — ASPIRIN EC 81 MG PO TBEC
81.0000 mg | DELAYED_RELEASE_TABLET | Freq: Every day | ORAL | Status: DC
  Administered 2015-05-23 – 2015-05-27 (×5): 81 mg via ORAL
  Filled 2015-05-22 (×5): qty 1

## 2015-05-22 MED ORDER — ATENOLOL 25 MG PO TABS
75.0000 mg | ORAL_TABLET | Freq: Every day | ORAL | Status: DC
  Administered 2015-05-23 – 2015-05-27 (×5): 75 mg via ORAL
  Filled 2015-05-22 (×5): qty 3

## 2015-05-22 MED ORDER — POTASSIUM CHLORIDE 20 MEQ PO PACK
40.0000 meq | PACK | Freq: Once | ORAL | Status: AC
  Administered 2015-05-22: 40 meq via ORAL
  Filled 2015-05-22: qty 2

## 2015-05-22 MED ORDER — SODIUM CHLORIDE 0.9 % IV SOLN
INTRAVENOUS | Status: AC
  Administered 2015-05-22: 20:00:00 via INTRAVENOUS

## 2015-05-22 MED ORDER — LISINOPRIL 10 MG PO TABS
10.0000 mg | ORAL_TABLET | Freq: Every day | ORAL | Status: DC
  Administered 2015-05-23 – 2015-05-27 (×5): 10 mg via ORAL
  Filled 2015-05-22 (×5): qty 1

## 2015-05-22 MED ORDER — ENOXAPARIN SODIUM 40 MG/0.4ML ~~LOC~~ SOLN
40.0000 mg | SUBCUTANEOUS | Status: DC
Start: 2015-05-22 — End: 2015-05-27
  Administered 2015-05-22 – 2015-05-26 (×4): 40 mg via SUBCUTANEOUS
  Filled 2015-05-22 (×4): qty 0.4

## 2015-05-22 MED ORDER — DEXTROSE 5 % IV SOLN
1.0000 g | INTRAVENOUS | Status: DC
  Administered 2015-05-22 – 2015-05-24 (×3): 1 g via INTRAVENOUS
  Filled 2015-05-22 (×4): qty 10

## 2015-05-22 MED ORDER — DIATRIZOATE MEGLUMINE & SODIUM 66-10 % PO SOLN
15.0000 mL | Freq: Once | ORAL | Status: AC
  Administered 2015-05-22: 15 mL via ORAL

## 2015-05-22 MED ORDER — SODIUM CHLORIDE 0.9 % IV BOLUS (SEPSIS)
1000.0000 mL | Freq: Once | INTRAVENOUS | Status: AC
  Administered 2015-05-22: 1000 mL via INTRAVENOUS

## 2015-05-22 MED ORDER — INSULIN ASPART 100 UNIT/ML ~~LOC~~ SOLN
0.0000 [IU] | Freq: Every day | SUBCUTANEOUS | Status: DC
  Administered 2015-05-26: 2 [IU] via SUBCUTANEOUS
  Filled 2015-05-22: qty 3
  Filled 2015-05-22: qty 2

## 2015-05-22 MED ORDER — POTASSIUM CHLORIDE IN NACL 20-0.9 MEQ/L-% IV SOLN
Freq: Once | INTRAVENOUS | Status: AC
  Administered 2015-05-22: 14:00:00 via INTRAVENOUS
  Filled 2015-05-22: qty 1000

## 2015-05-22 MED ORDER — DOCUSATE SODIUM 100 MG PO CAPS
100.0000 mg | ORAL_CAPSULE | Freq: Two times a day (BID) | ORAL | Status: DC
Start: 2015-05-22 — End: 2015-05-27
  Administered 2015-05-22 – 2015-05-27 (×9): 100 mg via ORAL
  Filled 2015-05-22 (×9): qty 1

## 2015-05-22 MED ORDER — SODIUM CHLORIDE 0.9% FLUSH
3.0000 mL | Freq: Two times a day (BID) | INTRAVENOUS | Status: DC
  Administered 2015-05-22 – 2015-05-27 (×9): 3 mL via INTRAVENOUS

## 2015-05-22 MED ORDER — ONDANSETRON HCL 4 MG/2ML IJ SOLN: 4.0000 mg | Freq: Four times a day (QID) | INTRAMUSCULAR | Status: DC | PRN

## 2015-05-22 MED ORDER — ONDANSETRON HCL 4 MG PO TABS: 4.0000 mg | ORAL_TABLET | Freq: Four times a day (QID) | ORAL | Status: DC | PRN

## 2015-05-22 MED ORDER — INSULIN DETEMIR 100 UNIT/ML ~~LOC~~ SOLN
30.0000 [IU] | Freq: Every day | SUBCUTANEOUS | Status: DC
  Administered 2015-05-22: 30 [IU] via SUBCUTANEOUS
  Filled 2015-05-22 (×3): qty 0.3

## 2015-05-22 MED ORDER — ACETAMINOPHEN 325 MG PO TABS: 650.0000 mg | ORAL_TABLET | Freq: Four times a day (QID) | ORAL | Status: DC | PRN

## 2015-05-22 NOTE — ED Notes (Addendum)
Care giver states AMS and off balance, wondering if she has a UTI with strong smelling urine, states her trazodone was recently cut back from 25 mg, states her home health nurse reported a BP of 80/50 this AM, pt has hx of dementia, states she is on 60 units of levimir and metformin and had insulin dose recently changed, reports CBG of 68 this AM before breakfast

## 2015-05-22 NOTE — Progress Notes (Signed)
Pt confused, oriented only to self.  Admission profile deferred at this time.

## 2015-05-22 NOTE — Plan of Care (Signed)
Problem: Safety: Goal: Ability to remain free from injury will improve Outcome: Progressing High falls precautions in place; pt w/ h/o dementia and +UTI-- sitter at bedside.

## 2015-05-22 NOTE — Progress Notes (Signed)
Pt. admitted to unit, [id number] from ED, [place]. Oriented to room, call bell, Ascom phones and staff. Bed in low position. Fall safety plan reviewed, yellow non-skid socks in place, bed alarm on, safety sitter at bedside. Full assessment to Epic; skin assessed with Leighton ParodyKathy S., RN. Telemetry box verified with tele clerk and Leighton ParodyKathy S., RN: [phone] . Will continue to monitor.

## 2015-05-22 NOTE — Progress Notes (Signed)
Anticoagulation monitoring(Lovenox):  82 yo  ordered Lovenox 30 mg Q24h  Filed Weights   05/22/15 1118 05/22/15 1747  Weight: 175 lb (79.379 kg) 151 lb 5 oz (68.634 kg)   BMI  Lab Results  Component Value Date   CREATININE 1.07* 05/22/2015   CREATININE 0.50 01/04/2015   CREATININE 0.51 01/03/2015   Estimated Creatinine Clearance: 38.7 mL/min (by C-G formula based on Cr of 1.07). Hemoglobin & Hematocrit     Component Value Date/Time   HGB 14.9 05/22/2015 1129   HGB 14.5 01/09/2013 1900   HCT 44.0 05/22/2015 1129   HCT 42.5 01/09/2013 1900     Per Protocol for Patient with estCrcl > 30 ml/min and BMI < 40, will transition to Lovenox 40 mg Q24h.

## 2015-05-22 NOTE — H&P (Addendum)
[place] [facility] [place] NAME: Jenna Robinson    MR#:  [id number]  DATE OF BIRTH:  10/20/1925  DATE OF ADMISSION:  05/22/2015  PRIMARY CARE PHYSICIAN: Elba Barman, MD   REQUESTING/REFERRING PHYSICIAN: Harvest Dark, MD  CHIEF COMPLAINT:  Gen weakness  HISTORY OF PRESENT ILLNESS:  Jenna Robinson  is a 82 y.o. female with a known history of uterine cancer status post abdominal hysterectomy, diabetes mellitus is presenting to the ED for generalized weakness, off balance and having difficulty to get out of the bed. In the ED her calcium was found to be at 14 and potassium was at 2.9. Patient was given IV fluid boluses and hospitalist team is called to admit the patient. Patient is demented and takes calcium supplements probably she could have overdosed which is not quite clear. CT abdomen and pelvis is ordered which is pending at this time. Patient is a poor historian as she is demented. Patient was hypotensive with blood pressure at 80/50 this a.m. as reported by her caregiver according to the caregiver patient is more confused than her baseline      PAST MEDICAL HISTORY:   Past Medical History  Diagnosis Date  . Diabetes mellitus without complication ([place])   Dementia Essential hypertension History of uterine cancer status post abdominal hysterectomy  PAST SURGICAL HISTOIRY:   Past Surgical History  Procedure Laterality Date  . Abdominal hysterectomy      SOCIAL HISTORY:   Social History  Substance Use Topics  . Smoking status: Never Smoker   . Smokeless tobacco: Not on file  . Alcohol Use: No   Caregiver lives with her FAMILY HISTORY:   Family History  Problem Relation Age of Onset  . Cancer      multiple family members including all siblings    DRUG ALLERGIES:  No Known Allergies  REVIEW OF SYSTEMS:  Unobtainable as patient is demented and more altered MEDICATIONS AT HOME:   Prior to Admission medications    Medication Sig Start Date End Date Taking? Authorizing Provider  acetaminophen (TYLENOL) 325 MG tablet Take 650 mg by mouth every 6 (six) hours as needed for mild pain, moderate pain, fever or headache.   Yes Historical Provider, MD  aspirin EC 81 MG tablet Take 81 mg by mouth daily.   Yes Historical Provider, MD  atenolol (TENORMIN) 25 MG tablet Take 75 mg by mouth daily.    Yes Historical Provider, MD  Calcium Carbonate-Vitamin D (CALCIUM 600+D) 600-400 MG-UNIT tablet Take 1 tablet by mouth 2 (two) times daily.   Yes Historical Provider, MD  insulin detemir (LEVEMIR) 100 unit/ml SOLN Inject 0.16 mLs (16 Units total) into the skin daily. Patient taking differently: Inject 30 Units into the skin at bedtime.  01/04/15  Yes Gladstone Lighter, MD  lisinopril (PRINIVIL,ZESTRIL) 10 MG tablet Take 1 tablet by mouth daily.   Yes Historical Provider, MD  metFORMIN (GLUMETZA) 500 MG (MOD) 24 hr tablet Take 1,000 mg by mouth 2 (two) times daily.    Yes Historical Provider, MD  traZODone (DESYREL) 50 MG tablet Take 25 mg by mouth at bedtime.   Yes Historical Provider, MD  Vitamin D, Ergocalciferol, (DRISDOL) 50000 units CAPS capsule Take 50,000 Units by mouth every 7 (seven) days.   Yes Historical Provider, MD  atorvastatin (LIPITOR) 20 MG tablet Take 1 tablet (20 mg total) by mouth daily. Patient not taking: Reported on 05/22/2015 01/05/15   Gladstone Lighter, MD  Nicotine 21-14-7  MG/24HR KIT Place 21 mg onto the skin daily. Patient not taking: Reported on 05/22/2015 01/05/15   Gladstone Lighter, MD      VITAL SIGNS:  Blood pressure 118/55, pulse 55, resp. rate 22, height _0  (1.778 m), weight 79.379 kg (175 lb), SpO2 98 %.  PHYSICAL EXAMINATION:  GENERAL:  82 y.o.-year-old patient lying in the bed with no acute distress.  EYES: Pupils equal, round, reactive to light and accommodation. No scleral icterus. Extraocular muscles intact.  HEENT: Head atraumatic, normocephalic. Oropharynx and nasopharynx  clear.  NECK:  Supple, no jugular venous distention. No thyroid enlargement, no tenderness.  LUNGS: Normal breath sounds bilaterally, no wheezing, rales,rhonchi or crepitation. No use of accessory muscles of respiration.  CARDIOVASCULAR: S1, S2 normal. No murmurs, rubs, or gallops.  ABDOMEN: Soft, nontender, nondistended. Bowel sounds present. No organomegaly or mass.  EXTREMITIES: No pedal edema, cyanosis, or clubbing.  NEUROLOGIC: Following verbal commands , awake and alert, oriented 1 PSYCHIATRIC: The patient is alert and Demented  SKIN: No obvious rash, lesion, or ulcer.   LABORATORY PANEL:   CBC  Recent Labs Lab 05/22/15 1129  WBC 15.7*  HGB 14.9  HCT 44.0  PLT 267   ------------------------------------------------------------------------------------------------------------------  Chemistries   Recent Labs Lab 05/22/15 1129 05/22/15 1302  NA 133*  --   K 2.9*  --   CL 92*  --   CO2 31  --   GLUCOSE 143*  --   BUN 22*  --   CREATININE 1.07*  --   CALCIUM 14.3* 13.6*   ------------------------------------------------------------------------------------------------------------------  Cardiac Enzymes  Recent Labs Lab 05/22/15 1302  TROPONINI <0.03   ------------------------------------------------------------------------------------------------------------------  RADIOLOGY:  No results found.  EKG:   Orders placed or performed during the hospital encounter of 05/22/15  . ED EKG  . ED EKG  . EKG 12-Lead  . EKG 12-Lead    IMPRESSION AND PLAN:   82 year old Caucasian female with past medical history of uterine cancer status post abdominal hysterectomy, diabetes mellitus is presenting to the ED for generalized weakness, off balance and having difficulty to get out of the bed. In the ED her calcium was found to be at 14 and potassium was at 2.9. Patient was given IV fluid boluses and hospitalist team is called to admit the patient.  # Altered mental  status on baseline dementia could be from dehydration/ electrolyte abnormalities Provide IV fluids and neuro checks Check urine culture and sensitivity We will get CT head  #Hypercalcemia could be from dehydration/Overdosing calcium supplements/possible metastatic cancer Providing aggressive hydration with IV fluids boluses were given in the ED. We will continue IV fluids normal saline at 125 ml per hour Repeat BMP at 6 PM and repeat in a.m. Calcium is trending down on 14.3/13.6 CT abdomen and pelvis Check TSH and parathyroid hormone   #Hypokalemia Replete potassium and repeat potassium and magnesium levels  #Insulin requiring diabetes mellitus Resume home dose Levemir 30 units daily at bedtime and hold metformin Sliding scale insulin and diabetic diet  #History of uterine cancer status post hysterectomy Will get CT abdomen and pelvis to rule out metastases Hold metformin  #Abnormal urinalysis Urine culture and sensitivity is ordered. Patient will be on IV Rocephin empirically   #Chronic history of dementia Resume her home medication Sitter for safety  #Generalized weakness PT evaluation  Provide GI and DVT prophylaxis      All the records are reviewed and case discussed with ED provider. Management plans discussed with the patient, family and  caregiver  they are in agreement.  CODE STATUS: DO NOT RESUSCITATE,  Niece  Dola Factor is the healthcare power of attorney  TOTAL TIME TAKING CARE OF THIS PATIENT: 45  minutes.    Nicholes Mango M.D on 05/22/2015 at 3:04 PM  Between 7am to 6pm - Pager - [phone]  After 6pm go to [email or web address] - password [email or web address] [facility]  Office  [phone]  CC: Primary care physician; Elba Barman, MD

## 2015-05-22 NOTE — ED Provider Notes (Signed)
[facility] Emergency Department Provider Note  Time seen: 12:54 PM  I have reviewed the triage vital signs and the nursing notes.   HISTORY  Chief Complaint Hypotension    HPI Jenna Robinson is a 82 y.o. female with a past medical history of diabetes, dementia, presents to the emergency department with generalized weakness. According to family for the past one week the patient has been extremely weak, having difficulty ambulating, difficulty getting up due to generalized weakness. Family has not noted any particularly weak extremity. States the patient is very somnolent and sleeps most of the day.This morning the patient's home health nurse took her blood pressure which was 80/50 so they brought the patient to the emergency department for further workup. The patient does have a history of dementia and is limited in the history that she can provide.  The family has noted a strong smell to her urine.     Past Medical History  Diagnosis Date  . Diabetes mellitus without complication [facility])     Patient Active Problem List   Diagnosis Date Noted  . Elevated troponin 01/02/2015  . Rhabdomyolysis 01/02/2015    Past Surgical History  Procedure Laterality Date  . Abdominal hysterectomy      Current Outpatient Rx  Name  Route  Sig  Dispense  Refill  . atenolol (TENORMIN) 25 MG tablet   Oral   Take 3 tablets by mouth daily.         Marland Kitchen atorvastatin (LIPITOR) 20 MG tablet   Oral   Take 1 tablet (20 mg total) by mouth daily.   30 tablet   0   . calcium-vitamin D (OSCAL WITH D) 500-200 MG-UNIT tablet   Oral   Take 1 tablet by mouth 2 (two) times daily.         . insulin detemir (LEVEMIR) 100 unit/ml SOLN   Subcutaneous   Inject 0.16 mLs (16 Units total) into the skin daily.   100 mL   0   . lisinopril (PRINIVIL,ZESTRIL) 10 MG tablet   Oral   Take 1 tablet by mouth daily.         . magnesium oxide (MAG-OX) 400 MG tablet   Oral   Take  400 mg by mouth 2 (two) times daily.         . metFORMIN (GLUMETZA) 500 MG (MOD) 24 hr tablet   Oral   Take 2 tablets by mouth 2 (two) times daily.         . Nicotine 21-14-7 MG/24HR KIT   Transdermal   Place 21 mg onto the skin daily.   30 each   2     Allergies Review of patient's allergies indicates no known allergies.  Family History  Problem Relation Age of Onset  . Cancer      multiple family members including all siblings    Social History Social History  Substance Use Topics  . Smoking status: Never Smoker   . Smokeless tobacco: None  . Alcohol Use: No    Review of Systems Constitutional: Negative for fever. Cardiovascular: Negative for chest pain. Respiratory: Negative for shortness of breath. Gastrointestinal: Negative for abdominal pain Musculoskeletal: Negative for back pain Neurological: Negative for headache 10-point ROS otherwise negative. Possibly limited by dementia.  ____________________________________________   PHYSICAL EXAM:  VITAL SIGNS: ED Triage Vitals  Enc Vitals Group     BP 05/22/15 1118 112/66 mmHg     Pulse Rate 05/22/15 1118 67  Resp 05/22/15 1118 18     Temp --      Temp Source 05/22/15 1118 Oral     SpO2 05/22/15 1118 100 %     Weight 05/22/15 1118 175 lb (79.379 kg)     Height 05/22/15 1118 5' 10"  (1.778 m)     Head Cir --      Peak Flow --      Pain Score --      Pain Loc --      Pain Edu? --      Excl. in [place]? --     Constitutional: Alert. In bed keeps her eyes closed most the exam but will open to answer questions. Does follow commands. Eyes: Normal exam ENT   Head: Normocephalic and atraumatic   Mouth/Throat: Mucous membranes are moist. Cardiovascular: Normal rate, regular rhythm. No murmur Respiratory: Normal respiratory effort without tachypnea nor retractions. Breath sounds are clear  Gastrointestinal: Soft and nontender. No distention.   Musculoskeletal: Nontender with normal range of motion  in all extremities. Neurologic:  Normal speech and language. No gross focal neurologic deficits  Skin:  Skin is warm, dry and intact.  Psychiatric: Mood and affect are normal.   ____________________________________________    EKG  EKG reviewed and interpreted by myself shows normal sinus rhythm at 72 bpm, narrow QRS, normal axis, largely normal intervals with nonspecific ST changes. No ST elevations noted.  ____________________________________________    INITIAL IMPRESSION / ASSESSMENT AND PLAN / ED COURSE  Pertinent labs & imaging results that were available during my care of the patient were reviewed by me and considered in my medical decision making (see chart for details).  Patient presents the emergency department with generalized weakness, increased somnolence over the past one week. Patient's labs have resulted showing a significant calcium elevation of 14.3. Urinalysis is equivocal for urinary tract infection we will cover with Keflex while awaiting culture results. Patient does take calcium supplements, family denies any increase in her supplements. We will repeat her calcium level, check a troponin, begin IV hydration to dilute her hypercalcemia.   Patient's repeat labs consistent with hypercalcemia. We will place on IV fluids and admitted to the hospital for further workup and treatment. Patient does have an equivocal urinary tract infection on urinalysis. We'll dose Keflex and send a urine culture.  ____________________________________________   FINAL CLINICAL IMPRESSION(S) / ED DIAGNOSES  Hypercalcemia Generalized weakness UTI  Harvest Dark, MD 05/22/15 1354

## 2015-05-23 LAB — CBC
HEMATOCRIT: 37 % (ref 35.0–47.0)
HEMOGLOBIN: 13.1 g/dL (ref 12.0–16.0)
MCH: 30.7 pg (ref 26.0–34.0)
MCHC: 35.3 g/dL (ref 32.0–36.0)
MCV: 87 fL (ref 80.0–100.0)
Platelets: 147 10*3/uL — ABNORMAL LOW (ref 150–440)
RBC: 4.25 MIL/uL (ref 3.80–5.20)
RDW: 13.3 % (ref 11.5–14.5)
WBC: 8.4 10*3/uL (ref 3.6–11.0)

## 2015-05-23 LAB — COMPREHENSIVE METABOLIC PANEL
ALT: 31 U/L (ref 14–54)
ANION GAP: 4 — AB (ref 5–15)
AST: 45 U/L — ABNORMAL HIGH (ref 15–41)
Albumin: 3.5 g/dL (ref 3.5–5.0)
Alkaline Phosphatase: 47 U/L (ref 38–126)
BUN: 14 mg/dL (ref 6–20)
CHLORIDE: 104 mmol/L (ref 101–111)
CO2: 27 mmol/L (ref 22–32)
Calcium: 11.5 mg/dL — ABNORMAL HIGH (ref 8.9–10.3)
Creatinine, Ser: 0.64 mg/dL (ref 0.44–1.00)
GFR calc non Af Amer: >60 mL/min (ref >60)
Glucose, Bld: 72 mg/dL (ref 65–99)
POTASSIUM: 3.5 mmol/L (ref 3.5–5.1)
SODIUM: 135 mmol/L (ref 135–145)
Total Bilirubin: 0.6 mg/dL (ref 0.3–1.2)
Total Protein: 5.9 g/dL — ABNORMAL LOW (ref 6.5–8.1)

## 2015-05-23 LAB — GLUCOSE, CAPILLARY
GLUCOSE-CAPILLARY: 73 mg/dL (ref 65–99)
Glucose-Capillary: 62 mg/dL — ABNORMAL LOW (ref 65–99)
Glucose-Capillary: 111 mg/dL — ABNORMAL HIGH (ref 65–99)
Glucose-Capillary: 182 mg/dL — ABNORMAL HIGH (ref 65–99)

## 2015-05-23 LAB — MAGNESIUM
MAGNESIUM: 2 mg/dL (ref 1.7–2.4)
Magnesium: 0.7 mg/dL — CL (ref 1.7–2.4)

## 2015-05-23 LAB — MRSA PCR SCREENING: MRSA BY PCR: NEGATIVE

## 2015-05-23 LAB — HEMOGLOBIN A1C: HEMOGLOBIN A1C: 6.5 % — AB (ref 4.0–6.0)

## 2015-05-23 LAB — TSH: TSH: 2.782 u[IU]/mL (ref 0.350–4.500)

## 2015-05-23 MED ORDER — MAGNESIUM SULFATE 4 GM/100ML IV SOLN
4.0000 g | Freq: Once | INTRAVENOUS | Status: AC
  Administered 2015-05-23: 4 g via INTRAVENOUS
  Filled 2015-05-23: qty 100

## 2015-05-23 MED ORDER — SODIUM CHLORIDE 0.9 % IV SOLN
INTRAVENOUS | Status: DC
  Administered 2015-05-24: 07:00:00 via INTRAVENOUS

## 2015-05-23 NOTE — Progress Notes (Signed)
Physical Therapy Evaluation Patient Details Name: Jenna Robinson MRN: [id number] DOB: 10/08/1925 Today's Date: 05/23/2015   History of Present Illness  Jenna Robinson is a 82 y.o. female with a known history of uterine cancer status post abdominal hysterectomy, diabetes mellitus is presenting to the ED for generalized weakness, off balance and having difficulty to get out of the bed. In the ED her calcium was found to be at 14 and potassium was at 2.9. Patient was given IV fluid boluses and hospitalist team is called to admit the patient. Patient is demented and takes calcium supplements probably she could have overdosed which is not quite clear. CT abdomen and pelvis is ordered which is pending at this time. Patient is a poor historian as she is demented. Patient was hypotensive with blood pressure at 80/50 this a.m. as reported by her caregiver according to the caregiver patient is more confused than her baseline  Clinical Impression  Pt presents to PT this date with lethargy, generalized weakness, and decreased tolerance to activity.  RN reports that pt did not sleep well last night so this may be contributing to pt's lack of vigor this date.  Pt required CGA for bed mobility and transfers and complete bed to chair transfer with CGA using RW.  Pt reports not using a device at baseline, but benefited from use of RW for stability and balance with transfers.  Pt would benefit from acute PT services to address objective findings, recommend f/u with SNF at this time unless pt progresses during stay.    Follow Up Recommendations SNF    Equipment Recommendations  Rolling walker with 5" wheels    Recommendations for Other Services       Precautions / Restrictions Precautions Precautions: Fall Precaution Comments: High Restrictions Weight Bearing Restrictions: No      Mobility  Bed Mobility Overal bed mobility: Needs Assistance Bed Mobility: Supine to Sit     Supine to sit: Min  guard     General bed mobility comments: Good body mechanics with supine to sit transfer, able to complete in one continuous movement with CGA for initial sitting balance.  Transfers Overall transfer level: Needs assistance Equipment used: Rolling walker (2 wheeled) Transfers: Sit to/from [employer] Sit to Stand: Min guard Stand pivot transfers: Min guard       General transfer comment: Initially needing CGA for standing balance, bracing with posterior legs against bed and slow to rise.  Ambulation/Gait Ambulation/Gait assistance: Min guard Ambulation Distance (Feet): 3 Feet Assistive device: Rolling walker (2 wheeled)       General Gait Details: Bed to chair only this date with CGA, able to maneuver RW, but demonstrated decreased safety, pivoting hips to sit before backing up completely to recliner.  Stairs            Wheelchair Mobility    Modified Rankin (Stroke Patients Only)       Balance Overall balance assessment: Needs assistance Sitting-balance support: Feet supported Sitting balance-Leahy Scale: Good     Standing balance support: Bilateral upper extremity supported;During functional activity Standing balance-Leahy Scale: Fair                               Pertinent Vitals/Pain Pain Assessment: No/denies pain    Home Living Family/patient expects to be discharged to:: Private residence Living Arrangements: Alone Available Help at Discharge: Home health;Family Type of Home: House Home Access: Stairs to enter Entrance  Stairs-Rails: Right Entrance Stairs-Number of Steps: 3 Home Layout: One level        Prior Function Level of Independence: Independent         Comments: Pt poor historian, states she lives alone and has help every once in a while.  Unable to verify.     Hand Dominance        Extremity/Trunk Assessment   Upper Extremity Assessment: Overall WFL for tasks assessed           Lower  Extremity Assessment: Overall WFL for tasks assessed         Communication   Communication: No difficulties  Cognition Arousal/Alertness: Lethargic Behavior During Therapy: Flat affect Overall Cognitive Status: History of cognitive impairments - at baseline                      General Comments      Exercises        Assessment/Plan    PT Assessment Patient needs continued PT services  PT Diagnosis Difficulty walking;Generalized weakness   PT Problem List Decreased strength;Decreased activity tolerance;Decreased balance;Decreased mobility;Decreased knowledge of use of DME;Decreased safety awareness  PT Treatment Interventions DME instruction;Gait training;Stair training;Functional mobility training;Therapeutic activities;Therapeutic exercise;Balance training   PT Goals (Current goals can be found in the Care Plan section) Acute Rehab PT Goals Patient Stated Goal: None stated PT Goal Formulation: With patient Time For Goal Achievement: 06/06/15 Potential to Achieve Goals: Fair    Frequency Min 2X/week   Barriers to discharge Decreased caregiver support pt reports living alone with little assist    Co-evaluation               End of Session Equipment Utilized During Treatment: Gait belt Activity Tolerance: Patient limited by fatigue Patient left: in chair;with call bell/phone within reach;with chair alarm set Nurse Communication: Mobility status         Time: 0950-1020 PT Time Calculation (min) (ACUTE ONLY): 30 min   Charges:   PT Evaluation $PT Eval Low Complexity: 1 Procedure     PT G Codes:        Aristides Luckey A Kirklin Mcduffee, PT 05/23/2015, 10:30 AM

## 2015-05-23 NOTE — Progress Notes (Signed)
Patient's IVF order expired, per Dr. Allena KatzPatel place another IVF order. Order placed. Trudee KusterBrandi R Mansfield

## 2015-05-23 NOTE — [place] FL2 (Signed)
[place] MEDICAID FL2 LEVEL OF CARE SCREENING TOOL     IDENTIFICATION  Patient Name: Jenna Robinson Birthdate: [birth year] Sex: female Admission Date (Current Location): 05/22/2015  [place]   Facility and Address:  [facility], [address], [place], [place] [address]      Provider Number: [id number]  Attending Physician Name and Address:  Auburn BilberryShreyang Patel, MD  Relative Name and Phone Number:       Current Level of Care: Hospital Recommended Level of Care: Skilled Nursing Facility Prior Approval Number:    Date Approved/Denied:   PASRR Number: [id number]  Discharge Plan: SNF    Current Diagnoses: Patient Active Problem List   Diagnosis Date Noted  . Hypercalcemia 05/22/2015  . Elevated troponin 01/02/2015  . Rhabdomyolysis 01/02/2015    Orientation RESPIRATION BLADDER Height & Weight     Self, Place  Normal Continent Weight: 152 lb 1.6 oz (68.992 kg) Height:  5\' 9"  (175.3 cm)  BEHAVIORAL SYMPTOMS/MOOD NEUROLOGICAL BOWEL NUTRITION STATUS   (none)  (none) Continent Diet (carb modified)  AMBULATORY STATUS COMMUNICATION OF NEEDS Skin   Extensive Assist Verbally Normal                       Personal Care Assistance Level of Assistance  Bathing, Feeding, Dressing Bathing Assistance: Limited assistance Feeding assistance: Limited assistance Dressing Assistance: Limited assistance     Functional Limitations Info             SPECIAL CARE FACTORS FREQUENCY  PT (By licensed PT)                    Contractures Contractures Info: Not present    Additional Factors Info  Code Status, Allergies Code Status Info: DNR Allergies Info: NKA           Current Medications (05/23/2015):  This is the current hospital active medication list Current Facility-Administered Medications  Medication Dose Route Frequency Provider Last Rate Last Dose  . 0.9 %  sodium chloride infusion    Intravenous Continuous Auburn BilberryShreyang Patel, MD      . acetaminophen (TYLENOL) tablet 650 mg  650 mg Oral Q6H PRN Ramonita LabAruna Gouru, MD      . aspirin EC tablet 81 mg  81 mg Oral Daily Ramonita LabAruna Gouru, MD   81 mg at 05/23/15 1031  . atenolol (TENORMIN) tablet 75 mg  75 mg Oral Daily Ramonita LabAruna Gouru, MD   75 mg at 05/23/15 1031  . atorvastatin (LIPITOR) tablet 20 mg  20 mg Oral Daily Ramonita LabAruna Gouru, MD   20 mg at 05/23/15 1030  . cefTRIAXone (ROCEPHIN) 1 g in dextrose 5 % 50 mL IVPB  1 g Intravenous Q24H Ramonita LabAruna Gouru, MD 100 mL/hr at 05/22/15 1656 1 g at 05/22/15 1656  . docusate sodium (COLACE) capsule 100 mg  100 mg Oral BID Ramonita LabAruna Gouru, MD   100 mg at 05/23/15 1031  . enoxaparin (LOVENOX) injection 40 mg  40 mg Subcutaneous Q24H Ramonita LabAruna Gouru, MD   40 mg at 05/22/15 1822  . insulin aspart (novoLOG) injection 0-5 Units  0-5 Units Subcutaneous QHS Ramonita LabAruna Gouru, MD   0 Units at 05/22/15 2134  . insulin aspart (novoLOG) injection 0-9 Units  0-9 Units Subcutaneous TID WC Ramonita LabAruna Gouru, MD   0 Units at 05/23/15 0800  . lisinopril (PRINIVIL,ZESTRIL) tablet 10 mg  10 mg Oral Daily Ramonita LabAruna Gouru, MD   10 mg at 05/23/15 1031  .  ondansetron (ZOFRAN) tablet 4 mg  4 mg Oral Q6H PRN Ramonita Lab, MD       Or  . ondansetron (ZOFRAN) injection 4 mg  4 mg Intravenous Q6H PRN Aruna Gouru, MD      . sodium chloride flush (NS) 0.9 % injection 3 mL  3 mL Intravenous Q12H Ramonita Lab, MD   3 mL at 05/22/15 2207  . traZODone (DESYREL) tablet 25 mg  25 mg Oral QHS Ramonita Lab, MD   25 mg at 05/22/15 2205     Discharge Medications: Please see discharge summary for a list of discharge medications.  Relevant Imaging Results:  Relevant Lab Results:   Additional Information SS: [id number]  York Spaniel, LCSW

## 2015-05-23 NOTE — Care Management (Signed)
Patient lives in a home with a 24 care provided by Junius CreamerKaren Patterson [phone]. Patient receives home health care through [facility]. TC to Abby with [facility] who is going to verify disciplines. Spoke with Clydie BraunKaren and she states she can take patient back into the home. Spoke with Skeet LatchHCPOA, [name] and briefly discussed PT recommendations. She is agreeable to CSW follow up for placement. Will follow and assist as needed with discharge plan.

## 2015-05-23 NOTE — Progress Notes (Signed)
Nurse informed of critical mg level at 0.7. Doctor paged, new order mg 3g IV.

## 2015-05-23 NOTE — Progress Notes (Signed)
[facility] [place] [facility]                                                                                                                                                                                            Patient Demographics   Burnadette PopBarbara Staley, is a 82 y.o. female, DOB - [birth year], QMV:[id number]  Admit date - 05/22/2015   Admitting Physician Ramonita LabAruna Gouru, MD  Outpatient Primary [facility] the [facility] Sydnee CabalHOMAS H, MD   LOS - 1  Subjective: Patient admitted with generalized weakness noted to have hypotension, hypercalcemia and hypo kalemia. Patient reports that she is feeling better. She normally ambulates" and cleans at home.    Review of Systems:   CONSTITUTIONAL: No documented fever. Positive fatigue, positive weakness. No weight gain, no weight loss.  EYES: No blurry or double vision.  ENT: No tinnitus. No postnasal drip. No redness of the oropharynx.  RESPIRATORY: No cough, no wheeze, no hemoptysis. No dyspnea.  CARDIOVASCULAR: No chest pain. No orthopnea. No palpitations. No syncope.  GASTROINTESTINAL: No nausea, no vomiting or diarrhea. No abdominal pain. No melena or hematochezia.  GENITOURINARY: No dysuria or hematuria.  ENDOCRINE: No polyuria or nocturia. No heat or cold intolerance.  HEMATOLOGY: No anemia. No bruising. No bleeding.  INTEGUMENTARY: No rashes. No lesions.  MUSCULOSKELETAL: No arthritis. No swelling. No gout.  NEUROLOGIC: No numbness, tingling, or ataxia. No seizure-type activity.  PSYCHIATRIC: No anxiety. No insomnia. No ADD.    Vitals:   Filed Vitals:   05/22/15 1747 05/22/15 2105 05/23/15 0500 05/23/15 0635  BP:  167/69  117/53  Pulse:  73  66  Temp:  97.5 F (36.4 C)    TempSrc:  Oral    Resp:  16    Height: 5\' 9"  (1.753 m)     Weight: 68.634 kg (151 lb 5 oz)  68.992 kg (152 lb 1.6 oz)   SpO2:  96%      Wt Readings from Last 3 Encounters:  05/23/15 68.992 kg (152 lb 1.6 oz)  01/05/15 79.516 kg  (175 lb 4.8 oz)  06/14/14 79.833 kg (176 lb)     Intake/Output Summary (Last 24 hours) at 05/23/15 0919 Last data filed at 05/23/15 0700  Gross per 24 hour  Intake 2536.66 ml  Output    200 ml  Net 2336.66 ml    Physical Exam:   GENERAL: Pleasant-appearing in no apparent distress. Weak appearing HEAD, EYES, EARS, NOSE AND THROAT: Atraumatic, normocephalic. Extraocular muscles are intact. Pupils equal and reactive to light. Sclerae anicteric. No conjunctival injection. No oro-pharyngeal erythema.  NECK: Supple. There  is no jugular venous distention. No bruits, no lymphadenopathy, no thyromegaly.  HEART: Regular rate and rhythm,. No murmurs, no rubs, no clicks.  LUNGS: Clear to auscultation bilaterally. No rales or rhonchi. No wheezes.  ABDOMEN: Soft, flat, nontender, nondistended. Has good bowel sounds. No hepatosplenomegaly appreciated.  EXTREMITIES: No evidence of any cyanosis, clubbing, or peripheral edema.  +2 pedal and radial pulses bilaterally.  NEUROLOGIC: The patient is alert, awake, and oriented x3 with no focal motor or sensory deficits appreciated bilaterally.  SKIN: Moist and warm with no rashes appreciated.  Psych: Not anxious, depressed LN: No inguinal LN enlargement    Antibiotics   Anti-infectives    Start     Dose/Rate Route Frequency Ordered Stop   05/22/15 1530  cefTRIAXone (ROCEPHIN) 1 g in dextrose 5 % 50 mL IVPB     1 g 100 mL/hr over 30 Minutes Intravenous Every 24 hours 05/22/15 1518        Medications   Scheduled Meds: . aspirin EC  81 mg Oral Daily  . atenolol  75 mg Oral Daily  . atorvastatin  20 mg Oral Daily  . cefTRIAXone (ROCEPHIN)  IV  1 g Intravenous Q24H  . docusate sodium  100 mg Oral BID  . enoxaparin (LOVENOX) injection  40 mg Subcutaneous Q24H  . insulin aspart  0-5 Units Subcutaneous QHS  . insulin aspart  0-9 Units Subcutaneous TID WC  . lisinopril  10 mg Oral Daily  . sodium chloride flush  3 mL Intravenous Q12H  . traZODone   25 mg Oral QHS   Continuous Infusions: . sodium chloride 125 mL/hr at 05/22/15 1932   PRN Meds:.acetaminophen, ondansetron **OR** ondansetron (ZOFRAN) IV   Data Review:   Micro Results Recent Results (from the past 240 hour(s))  MRSA PCR Screening     Status: None   Collection Time: 05/22/15 11:07 PM  Result Value Ref Range Status   MRSA by PCR NEGATIVE NEGATIVE Final    Comment:        The GeneXpert MRSA Assay (FDA approved for NASAL specimens only), is one component of a comprehensive MRSA colonization surveillance program. It is not intended to diagnose MRSA infection nor to guide or monitor treatment for MRSA infections.     Radiology Reports Ct Head Wo Contrast  05/22/2015  CLINICAL DATA:  Generalized weakness, dementia EXAM: CT HEAD WITHOUT CONTRAST TECHNIQUE: Contiguous axial images were obtained from the base of the skull through the vertex without intravenous contrast. COMPARISON:  01/01/2015 FINDINGS: No evidence of parenchymal hemorrhage or extra-axial fluid collection. No mass lesion, mass effect, or midline shift. No CT evidence of acute infarction. Subcortical white matter and periventricular small vessel ischemic changes. Intracranial atherosclerosis. Global cortical atrophy.  No ventriculomegaly. The visualized paranasal sinuses are essentially clear. The mastoid air cells are unopacified. No evidence of calvarial fracture. IMPRESSION: No evidence of acute intracranial abnormality. Atrophy with small vessel ischemic changes. Electronically Signed   By: Charline Bills M.D.   On: 05/22/2015 15:36   Ct Abdomen Pelvis W Contrast  05/22/2015  CLINICAL DATA:  Generalized weakness, dementia EXAM: CT ABDOMEN AND PELVIS WITH CONTRAST TECHNIQUE: Multidetector CT imaging of the abdomen and pelvis was performed using the standard protocol following bolus administration of intravenous contrast. CONTRAST:  80mL ISOVUE-300 IOPAMIDOL (ISOVUE-300) INJECTION 61% COMPARISON:  None.  FINDINGS: Lower chest:  Lung bases are clear. Hepatobiliary: Liver is within normal limits. Gallbladder is unremarkable. No intrahepatic or extrahepatic ductal dilatation. Pancreas: Within normal limits. Spleen: Within  normal limits. Adrenals/Urinary Tract: Adrenal glands within normal limits. 3.3 cm left lower pole renal cyst. Right kidney is within normal limits. No hydronephrosis. Bladder is within normal limits. Stomach/Bowel: Stomach is within normal limits. No evidence of bowel obstruction. Normal appendix (series 2/ image 60). Sigmoid diverticulosis, without evidence of diverticulitis. Vascular/Lymphatic: Atherosclerotic calcifications of the abdominal aorta and branch vessels. No evidence of abdominal aortic aneurysm. Small upper abdominal lymph nodes which do not meet pathologic CT size criteria. Reproductive: Status post supracervical hysterectomy. No adnexal masses. Other: No abdominopelvic ascites. Musculoskeletal: Degenerative changes of the visualized thoracolumbar spine. Lumbar levoscoliosis. IMPRESSION: No evidence of bowel obstruction.  Normal appendix. Sigmoid diverticulosis, without evidence of diverticulitis. Otherwise unremarkable CT abdomen/pelvis. Electronically Signed   By: Charline Bills M.D.   On: 05/22/2015 15:34     CBC  Recent Labs Lab 05/22/15 1129 05/23/15 0448  WBC 15.7* 8.4  HGB 14.9 13.1  HCT 44.0 37.0  PLT 267 147*  MCV 87.8 87.0  MCH 29.7 30.7  MCHC 33.8 35.3  RDW 13.7 13.3    Chemistries   Recent Labs Lab 05/22/15 1129 05/22/15 1302 05/22/15 1807 05/23/15 0448  NA 133*  --  132* 135  K 2.9*  --  4.1 3.5  CL 92*  --  98* 104  CO2 31  --  27 27  GLUCOSE 143*  --  148* 72  BUN 22*  --  19 14  CREATININE 1.07*  --  0.84 0.64  CALCIUM 14.3* 13.6* 12.3* 11.5*  MG  --   --   --  0.7*  AST  --   --   --  45*  ALT  --   --   --  31  ALKPHOS  --   --   --  47  BILITOT  --   --   --  0.6    ------------------------------------------------------------------------------------------------------------------ estimated creatinine clearance is 51.8 mL/min (by C-G formula based on Cr of 0.64). ------------------------------------------------------------------------------------------------------------------ No results for input(s): HGBA1C in the last 72 hours. ------------------------------------------------------------------------------------------------------------------ No results for input(s): CHOL, HDL, LDLCALC, TRIG, CHOLHDL, LDLDIRECT in the last 72 hours. ------------------------------------------------------------------------------------------------------------------  Recent Labs  05/23/15 0448  TSH 2.782   ------------------------------------------------------------------------------------------------------------------ No results for input(s): VITAMINB12, FOLATE, FERRITIN, TIBC, IRON, RETICCTPCT in the last 72 hours.  Coagulation profile No results for input(s): INR, PROTIME in the last 168 hours.  No results for input(s): DDIMER in the last 72 hours.  Cardiac Enzymes  Recent Labs Lab 05/22/15 1302  TROPONINI <0.03   ------------------------------------------------------------------------------------------------------------------ Invalid input(s): POCBNP    Assessment & Plan   82 year old Caucasian female with past medical history of uterine cancer status post abdominal hysterectomy, diabetes mellitus is presenting to the ED for generalized weakness, off balance and having difficulty to get out of the bed. In the ED her calcium was found to be at 14 and potassium was at 2.9. Patient was given IV fluid boluses and hospitalist team is called to admit the patient.  # Altered mental status  Suspect due to combination of hypotension dehydration Also patient's blood glucose are low this morning and last night. Could also been having hypoglycemia Urinalysis without  evidence of infection No chest x-ray has been done I'll put in for a chest x-ray and blood cultures  #Hypercalcemia  Due to dehydration Calcium trending down  #Hypokalemia Replaced  #Insulin requiring diabetes mellitus Blood glucose low in the 50s early morning. I will stop her insulin   #History of uterine cancer status post hysterectomy  CT scan of the abdomen shows no significant abnormality  #gerneralized weakness- PT eval and treat  #Chronic history of dementia Resume her home medication  #Generalized weakness PT evaluation      Code Status Orders        Start     Ordered   05/22/15 1747  Do not attempt resuscitation (DNR)   Continuous    Question Answer Comment  In the event of cardiac or respiratory ARREST Do not call a "code blue"   In the event of cardiac or respiratory ARREST Do not perform Intubation, CPR, defibrillation or ACLS   In the event of cardiac or respiratory ARREST Use medication by any route, position, wound care, and other measures to relive pain and suffering. May use oxygen, suction and manual treatment of airway obstruction as needed for comfort.   Comments RN may pronounce      05/22/15 1746    Code Status History    Date Active Date Inactive Code Status Order ID Comments User Context   01/02/2015  1:58 AM 01/05/2015  4:30 PM Full Code [id number]  Arnaldo Natal, MD Inpatient    Advance Directive Documentation        Most Recent Value   Type of Advance Directive  Healthcare Power of Attorney   Pre-existing out of facility DNR order (yellow form or pink MOST form)     "MOST" Form in Place?             Consults   Physical trerapy   DVT Prophylaxis    Lab Results  Component Value Date   PLT 147* 05/23/2015     Time Spent in minutes   Greater than 50% of time spent in care coordination and counseling patient regarding the condition and plan of care.   Auburn Bilberry M.D on 05/23/2015 at 9:19 AM  Between 7am to  6pm - Pager - [phone]  After 6pm go to [email or web address] - password [email or web address] [facility]  [facility]   Office  [phone]

## 2015-05-24 ENCOUNTER — Inpatient Hospital Stay: Payer: Medicare Other

## 2015-05-24 LAB — GLUCOSE, CAPILLARY
GLUCOSE-CAPILLARY: 153 mg/dL — AB (ref 65–99)
GLUCOSE-CAPILLARY: 181 mg/dL — AB (ref 65–99)
Glucose-Capillary: 147 mg/dL — ABNORMAL HIGH (ref 65–99)
Glucose-Capillary: 141 mg/dL — ABNORMAL HIGH (ref 65–99)

## 2015-05-24 LAB — BASIC METABOLIC PANEL
ANION GAP: 6 (ref 5–15)
BUN: 8 mg/dL (ref 6–20)
CHLORIDE: 101 mmol/L (ref 101–111)
CO2: 29 mmol/L (ref 22–32)
Calcium: 10 mg/dL (ref 8.9–10.3)
Creatinine, Ser: 0.56 mg/dL (ref 0.44–1.00)
GFR calc Af Amer: >60 mL/min (ref >60)
GLUCOSE: 167 mg/dL — AB (ref 65–99)
POTASSIUM: 2.9 mmol/L — AB (ref 3.5–5.1)
Sodium: 136 mmol/L (ref 135–145)

## 2015-05-24 LAB — URINE CULTURE

## 2015-05-24 LAB — CBC
HEMATOCRIT: 35.9 % (ref 35.0–47.0)
Hemoglobin: 12.3 g/dL (ref 12.0–16.0)
MCH: 30 pg (ref 26.0–34.0)
MCHC: 34.2 g/dL (ref 32.0–36.0)
MCV: 87.5 fL (ref 80.0–100.0)
Platelets: 155 10*3/uL (ref 150–440)
RBC: 4.1 MIL/uL (ref 3.80–5.20)
RDW: 13.3 % (ref 11.5–14.5)
WBC: 5.4 10*3/uL (ref 3.6–11.0)

## 2015-05-24 LAB — PARATHYROID HORMONE, INTACT (NO CA): PTH: 163 pg/mL — ABNORMAL HIGH (ref 15–65)

## 2015-05-24 MED ORDER — POTASSIUM CHLORIDE CRYS ER 20 MEQ PO TBCR
40.0000 meq | EXTENDED_RELEASE_TABLET | Freq: Once | ORAL | Status: AC
  Administered 2015-05-24: 40 meq via ORAL
  Filled 2015-05-24: qty 2

## 2015-05-24 MED ORDER — POTASSIUM CHLORIDE CRYS ER 20 MEQ PO TBCR
40.0000 meq | EXTENDED_RELEASE_TABLET | ORAL | Status: AC
  Administered 2015-05-24 (×2): 40 meq via ORAL
  Filled 2015-05-24 (×2): qty 2

## 2015-05-24 NOTE — Care Management (Signed)
Reviewed CSW notes. Patient is already set up with [facility] for SN only. Will have them resume care at discharge.

## 2015-05-24 NOTE — Clinical Documentation Improvement (Signed)
Internal Medicine  Can the diagnosis of altered mental status be further specified?   Encephalopathy - Alcoholic, Anoxic/Hypoxia, Drug Induced/Toxic (specify drug), Hepatic, Hypertensive, Hypoglycemic, Metabolic/Septic, Traumatic/post concussive, Wernicke, Other  Other  Clinically Undetermined  Document any associated diagnoses/conditions. Please update your documentation within the medical record to reflect your response to this query. Thank you.  Supporting Information:(As per notes) "Altered mental status on baseline dementia"  "States the patient is very somnolent and sleeps most of the day." "Patient presents the emergency department with generalized weakness, increased somnolence over the past one week."  Please exercise your independent, professional judgment when responding. A specific answer is not anticipated or expected.   Thank You, Nevin BloodgoodJoan B Glendal Cassaday, RN, BSN, CCDS,Clinical Documentation Specialist:  [phone]  [phone]=[employer]- Health Information Management

## 2015-05-24 NOTE — Progress Notes (Signed)
Informed that patient's potassium is 2.9, Dr. Paged new order: 40 meq once. Will continue to monitor.

## 2015-05-24 NOTE — Clinical Social Work Note (Signed)
Clinical Social Work Assessment  Patient Details  Name: Jenna Robinson MRN: [id number] Date of Birth: 04-23-1925  Date of referral:  05/24/15               Reason for consult:  Facility Placement, Discharge Planning                Permission sought to share information with:  Case Manager, Magazine features editoracility Contact Representative, Family Supports Permission granted to share information::  Yes, Verbal Permission Granted  Name::        Agency::  [facility],  caregiver:Karen   Relationship::  HCPOA:  Jenna Robinson daughter  SolicitorContact Information:     Housing/Transportation Living arrangements for the past 2 months:  Single Family Home Source of Information:  Adult Children, Medical Team, Other (Comment Required) (caregiver Clydie BraunKaren) Patient Interpreter Needed:  None Criminal Activity/Legal Involvement Pertinent to Current Situation/Hospitalization:  No - Comment as needed Significant Relationships:  Adult Children, Other Family Members Lives with:  Self (has 24 hour caregiver) Do you feel safe going back to the place where you live?  Yes Need for family participation in patient care:  Yes (Comment) (due to dementia)  Care giving concerns:   LCSW was unable to complete assessment with patient due to oriented to self only. Patient's daughter contacted (POA) Jenna Robinson to discuss patient's baseline and plans. Daughter reports she has 24 hour care at home  And LCSW verified with caregiver Clydie BraunKaren.  HH also involved with [facility].  Daughter reports patient becomes anxious and agitated when out of her environment. She feels patient has been managing well at home [facility] caregiver [facility] previously in [facility]F: [facility] and at this time daughter is not agreeable to SNF.  Reports she wants patient to go home at discharge.   Social Worker assessment / plan:  LCSW completed assessment with caregiver and POA via phone. Explained role and consult and discussed options for disposition. At this time, plan will  be home, resume home health (consult for PT and SW) if appropriate.  LCSW will update CM. LCSW will assist with any other discharge needs as they arise, at this time will sign off.  CM to follow up with patient and family regarding home health interventions  Employment status:  Retired Health and safety inspectornsurance information:  Medicare PT Recommendations:  Skilled Nursing Facility Information / Referral to community resources:  Other (Comment Required) (home health physical therapy)  Patient/Family's Response to care:  Agreeable to plan  Patient/Family's Understanding of and Emotional Response to Diagnosis, Current Treatment, and Prognosis:  Patient daughter very aware and understanding of patient's baseline and confusion. She feels that patient would do better in home rather than a SNF due to cognition levels and confusion.  Emotional Assessment Appearance:  Appears stated age Attitude/Demeanor/Rapport:  Apprehensive Affect (typically observed):  Anxious, Overwhelmed Orientation:  Oriented to Self Alcohol / Substance use:  Not Applicable Psych involvement (Current and /or in the community):  No (Comment)  Discharge Needs  Concerns to be addressed:  No discharge needs identified Readmission within the last 30 days:  No Current discharge risk:  None Barriers to Discharge:  No Barriers Identified, Continued Medical Work up   Raye SorrowCoble, Raymar Joiner N, LCSW 05/24/2015, 10:29 AM

## 2015-05-24 NOTE — Progress Notes (Signed)
[facility] - [place] at [facility]                                                                                                                                                                                            Patient Demographics   Malloree Raboin, is a 82 y.o. female, DOB - January 18, 1925, ZOX:[id number]  Admit date - 05/22/2015   Admitting Physician Ramonita Lab, MD  Outpatient Primary MD for the patient is [facility], Sydnee Cabal, MD   LOS - 2  Subjective: Feels better  Denies any complatins  Review of Systems:   CONSTITUTIONAL: No documented fever. Positive fatigue, positive weakness. No weight gain, no weight loss.  EYES: No blurry or double vision.  ENT: No tinnitus. No postnasal drip. No redness of the oropharynx.  RESPIRATORY: No cough, no wheeze, no hemoptysis. No dyspnea.  CARDIOVASCULAR: No chest pain. No orthopnea. No palpitations. No syncope.  GASTROINTESTINAL: No nausea, no vomiting or diarrhea. No abdominal pain. No melena or hematochezia.  GENITOURINARY: No dysuria or hematuria.  ENDOCRINE: No polyuria or nocturia. No heat or cold intolerance.  HEMATOLOGY: No anemia. No bruising. No bleeding.  INTEGUMENTARY: No rashes. No lesions.  MUSCULOSKELETAL: No arthritis. No swelling. No gout.  NEUROLOGIC: No numbness, tingling, or ataxia. No seizure-type activity.  PSYCHIATRIC: No anxiety. No insomnia. No ADD.    Vitals:   Filed Vitals:   05/24/15 0443 05/24/15 0516 05/24/15 0816 05/24/15 1131  BP:  136/64 129/52 137/57  Pulse:  58 58 66  Temp:  97.5 F (36.4 C) 97.7 F (36.5 C) 98 F (36.7 C)  TempSrc:  Oral Oral Oral  Resp:  18 18 18   Height:      Weight: 68.539 kg (151 lb 1.6 oz)     SpO2:  100% 97% 100%    Wt Readings from Last 3 Encounters:  05/24/15 68.539 kg (151 lb 1.6 oz)  01/05/15 79.516 kg (175 lb 4.8 oz)  06/14/14 79.833 kg (176 lb)     Intake/Output Summary (Last 24 hours) at 05/24/15 1249 Last data filed at 05/24/15  1220  Gross per 24 hour  Intake    480 ml  Output   1250 ml  Net   -770 ml    Physical Exam:   GENERAL: Pleasant-appearing in no apparent distress. Weak appearing HEAD, EYES, EARS, NOSE AND THROAT: Atraumatic, normocephalic. Extraocular muscles are intact. Pupils equal and reactive to light. Sclerae anicteric. No conjunctival injection. No oro-pharyngeal erythema.  NECK: Supple. There is no jugular venous distention. No bruits, no lymphadenopathy, no thyromegaly.  HEART: Regular rate and rhythm,. No murmurs, no rubs,  no clicks.  LUNGS: Clear to auscultation bilaterally. No rales or rhonchi. No wheezes.  ABDOMEN: Soft, flat, nontender, nondistended. Has good bowel sounds. No hepatosplenomegaly appreciated.  EXTREMITIES: No evidence of any cyanosis, clubbing, or peripheral edema.  +2 pedal and radial pulses bilaterally.  NEUROLOGIC: The patient is alert, awake, and oriented x3 with no focal motor or sensory deficits appreciated bilaterally.  SKIN: Moist and warm with no rashes appreciated.  Psych: Not anxious, depressed LN: No inguinal LN enlargement    Antibiotics   Anti-infectives    Start     Dose/Rate Route Frequency Ordered Stop   05/22/15 1530  cefTRIAXone (ROCEPHIN) 1 g in dextrose 5 % 50 mL IVPB     1 g 100 mL/hr over 30 Minutes Intravenous Every 24 hours 05/22/15 1518        Medications   Scheduled Meds: . aspirin EC  81 mg Oral Daily  . atenolol  75 mg Oral Daily  . atorvastatin  20 mg Oral Daily  . cefTRIAXone (ROCEPHIN)  IV  1 g Intravenous Q24H  . docusate sodium  100 mg Oral BID  . enoxaparin (LOVENOX) injection  40 mg Subcutaneous Q24H  . insulin aspart  0-5 Units Subcutaneous QHS  . insulin aspart  0-9 Units Subcutaneous TID WC  . lisinopril  10 mg Oral Daily  . sodium chloride flush  3 mL Intravenous Q12H  . traZODone  25 mg Oral QHS   Continuous Infusions: . sodium chloride 75 mL/hr at 05/24/15 0650   PRN Meds:.acetaminophen, ondansetron **OR**  ondansetron (ZOFRAN) IV   Data Review:   Micro Results Recent Results (from the past 240 hour(s))  Urine culture     Status: Abnormal   Collection Time: 05/22/15 11:29 AM  Result Value Ref Range Status   Specimen Description URINE, CLEAN CATCH  Final   Special Requests NONE  Final   Culture MULTIPLE SPECIES PRESENT, SUGGEST RECOLLECTION (A)  Final   Report Status 05/24/2015 FINAL  Final  MRSA PCR Screening     Status: None   Collection Time: 05/22/15 11:07 PM  Result Value Ref Range Status   MRSA by PCR NEGATIVE NEGATIVE Final    Comment:        The GeneXpert MRSA Assay (FDA approved for NASAL specimens only), is one component of a comprehensive MRSA colonization surveillance program. It is not intended to diagnose MRSA infection nor to guide or monitor treatment for MRSA infections.     Radiology Reports Dg Chest 2 View  05/24/2015  CLINICAL DATA:  Shortness of breath, weakness, hypotension. EXAM: CHEST - 2 VIEW COMPARISON:  01/01/2015 FINDINGS: The heart size and mediastinal contours are within normal limits. There is no evidence of pulmonary edema, consolidation, pneumothorax, nodule or pleural fluid. The visualized skeletal structures are unremarkable. IMPRESSION: No active disease. Electronically Signed   By: Irish Lack M.D.   On: 05/24/2015 08:21   Ct Head Wo Contrast  05/22/2015  CLINICAL DATA:  Generalized weakness, dementia EXAM: CT HEAD WITHOUT CONTRAST TECHNIQUE: Contiguous axial images were obtained from the base of the skull through the vertex without intravenous contrast. COMPARISON:  01/01/2015 FINDINGS: No evidence of parenchymal hemorrhage or extra-axial fluid collection. No mass lesion, mass effect, or midline shift. No CT evidence of acute infarction. Subcortical white matter and periventricular small vessel ischemic changes. Intracranial atherosclerosis. Global cortical atrophy.  No ventriculomegaly. The visualized paranasal sinuses are essentially clear.  The mastoid air cells are unopacified. No evidence of calvarial fracture. IMPRESSION: No  evidence of acute intracranial abnormality. Atrophy with small vessel ischemic changes. Electronically Signed   By: Charline Bills M.D.   On: 05/22/2015 15:36   Ct Abdomen Pelvis W Contrast  05/22/2015  CLINICAL DATA:  Generalized weakness, dementia EXAM: CT ABDOMEN AND PELVIS WITH CONTRAST TECHNIQUE: Multidetector CT imaging of the abdomen and pelvis was performed using the standard protocol following bolus administration of intravenous contrast. CONTRAST:  80mL ISOVUE-300 IOPAMIDOL (ISOVUE-300) INJECTION 61% COMPARISON:  None. FINDINGS: Lower chest:  Lung bases are clear. Hepatobiliary: Liver is within normal limits. Gallbladder is unremarkable. No intrahepatic or extrahepatic ductal dilatation. Pancreas: Within normal limits. Spleen: Within normal limits. Adrenals/Urinary Tract: Adrenal glands within normal limits. 3.3 cm left lower pole renal cyst. Right kidney is within normal limits. No hydronephrosis. Bladder is within normal limits. Stomach/Bowel: Stomach is within normal limits. No evidence of bowel obstruction. Normal appendix (series 2/ image 60). Sigmoid diverticulosis, without evidence of diverticulitis. Vascular/Lymphatic: Atherosclerotic calcifications of the abdominal aorta and branch vessels. No evidence of abdominal aortic aneurysm. Small upper abdominal lymph nodes which do not meet pathologic CT size criteria. Reproductive: Status post supracervical hysterectomy. No adnexal masses. Other: No abdominopelvic ascites. Musculoskeletal: Degenerative changes of the visualized thoracolumbar spine. Lumbar levoscoliosis. IMPRESSION: No evidence of bowel obstruction.  Normal appendix. Sigmoid diverticulosis, without evidence of diverticulitis. Otherwise unremarkable CT abdomen/pelvis. Electronically Signed   By: Charline Bills M.D.   On: 05/22/2015 15:34     CBC  Recent Labs Lab 05/22/15 1129  05/23/15 0448 05/24/15 0521  WBC 15.7* 8.4 5.4  HGB 14.9 13.1 12.3  HCT 44.0 37.0 35.9  PLT 267 147* 155  MCV 87.8 87.0 87.5  MCH 29.7 30.7 30.0  MCHC 33.8 35.3 34.2  RDW 13.7 13.3 13.3    Chemistries   Recent Labs Lab 05/22/15 1129 05/22/15 1302 05/22/15 1807 05/23/15 0448 05/23/15 1000 05/24/15 0521  NA 133*  --  132* 135  --  136  K 2.9*  --  4.1 3.5  --  2.9*  CL 92*  --  98* 104  --  101  CO2 31  --  27 27  --  29  GLUCOSE 143*  --  148* 72  --  167*  BUN 22*  --  19 14  --  8  CREATININE 1.07*  --  0.84 0.64  --  0.56  CALCIUM 14.3* 13.6* 12.3* 11.5*  --  10.0  MG  --   --   --  0.7* 2.0  --   AST  --   --   --  45*  --   --   ALT  --   --   --  31  --   --   ALKPHOS  --   --   --  47  --   --   BILITOT  --   --   --  0.6  --   --    ------------------------------------------------------------------------------------------------------------------ estimated creatinine clearance is 51.8 mL/min (by C-G formula based on Cr of 0.56). ------------------------------------------------------------------------------------------------------------------  Recent Labs  05/23/15 0448  HGBA1C 6.5*   ------------------------------------------------------------------------------------------------------------------ No results for input(s): CHOL, HDL, LDLCALC, TRIG, CHOLHDL, LDLDIRECT in the last 72 hours. ------------------------------------------------------------------------------------------------------------------  Recent Labs  05/23/15 0448  TSH 2.782   ------------------------------------------------------------------------------------------------------------------ No results for input(s): VITAMINB12, FOLATE, FERRITIN, TIBC, IRON, RETICCTPCT in the last 72 hours.  Coagulation profile No results for input(s): INR, PROTIME in the last 168 hours.  No results for input(s): DDIMER in the last  72 hours.  Cardiac Enzymes  Recent Labs Lab 05/22/15 1302  TROPONINI  <0.03   ------------------------------------------------------------------------------------------------------------------ Invalid input(s): POCBNP    Assessment & Plan   82 year old Caucasian female with past medical history of uterine cancer status post abdominal hysterectomy, diabetes mellitus is presenting to the ED for generalized weakness, off balance and having difficulty to get out of the bed. In the ED her calcium was found to be at 14 and potassium was at 2.9. Patient was given IV fluid boluses and hospitalist team is called to admit the patient.  # Altered mental status  Suspect due to combination of hypotension dehydration Now improved  # hypoglycemia  Improved conitnue to monitor    #Hypercalcemia  Due to dehydration Now normal  #Hypokalemia Still very low start po K+ check mg   #Insulin requiring diabetes mellitus Blood glucose low in the 50s early morning. I will stop her insulin   #History of uterine cancer status post hysterectomy CT scan of the abdomen shows no significant abnormality  #gerneralized weakness- PT eval and treat  #Chronic history of dementia Resume her home medication  #Generalized weakness rehab       Code Status Orders        Start     Ordered   05/22/15 1747  Do not attempt resuscitation (DNR)   Continuous    Question Answer Comment  In the event of cardiac or respiratory ARREST Do not call a "code blue"   In the event of cardiac or respiratory ARREST Do not perform Intubation, CPR, defibrillation or ACLS   In the event of cardiac or respiratory ARREST Use medication by any route, position, wound care, and other measures to relive pain and suffering. May use oxygen, suction and manual treatment of airway obstruction as needed for comfort.   Comments RN may pronounce      05/22/15 1746    Code Status History    Date Active Date Inactive Code Status Order ID Comments User Context   01/02/2015  1:58 AM 01/05/2015  4:30 PM  Full Code [id number]  Arnaldo NatalMichael S Diamond, MD Inpatient    Advance Directive Documentation        Most Recent Value   Type of Advance Directive  Healthcare Power of Attorney   Pre-existing out of facility DNR order (yellow form or pink MOST form)     "MOST" Form in Place?             Consults   Physical trerapy   DVT Prophylaxis    Lab Results  Component Value Date   PLT 155 05/24/2015     Time Spent in minutes  32min  Greater than 50% of time spent in care coordination and counseling patient regarding the condition and plan of care.   Auburn BilberryPATEL, Channell Quattrone M.D on 05/24/2015 at 12:49 PM  Between 7am to 6pm - Pager - [phone]  After 6pm go to [email or web address] - password [email or web address] [facility]  [facility]   Office  [phone]

## 2015-05-24 NOTE — Care Management Important Message (Signed)
Important Message  Patient Details  Name: Jenna PhilipsBarbara Vernell Robinson MRN: [id number] Date of Birth: 1925/06/03   Medicare Important Message Given:  Yes    Olegario MessierKathy A Tiyon Sanor 05/24/2015, 1:32 PM

## 2015-05-24 NOTE — Progress Notes (Signed)
Physical Therapy Treatment Patient Details Name: Jenna Robinson MRN: [id number] DOB: [birth year] Today's Date: 05/24/2015    History of Present Illness Jenna Robinson is a 82 y.o. female with a known history of uterine cancer status post abdominal hysterectomy, diabetes mellitus is presenting to the ED for generalized weakness, off balance and having difficulty to get out of the bed. In the ED her calcium was found to be at 14 and potassium was at 2.9. Patient was given IV fluid boluses and hospitalist team is called to admit the patient. Patient is demented and takes calcium supplements probably she could have overdosed which is not quite clear. CT abdomen and pelvis is ordered which is pending at this time. Patient is a poor historian as she is demented. Patient was hypotensive with blood pressure at 80/50 this a.m. as reported by her caregiver according to the caregiver patient is more confused than her baseline.    PT Comments    Pt able to walk around nursing loop with RW CGA; pt did require consistent vc's to keep RW closer but otherwise steady without loss of balance.  Trialed ambulation without AD but pt unable to ambulate without holding onto items in room for support/balance.  Pt appears safe to discharge home with 24/7 assist for functional mobility for safety and use of RW (pt will require consistent vc's for safe walker use).  Will continue to progress pt with higher level balance per pt tolerance during hospital stay in order to transition to LRAD with functional mobility.   Follow Up Recommendations  Supervision/Assistance - 24 hour (HHPT for higher level balance pending pt's progress)     Equipment Recommendations  Rolling walker with 5" wheels    Recommendations for Other Services       Precautions / Restrictions Precautions Precautions: Fall Restrictions Weight Bearing Restrictions: No    Mobility  Bed Mobility Overal bed mobility: Needs Assistance Bed  Mobility: Supine to Sit;Sit to Supine     Supine to sit: Supervision Sit to supine: Supervision   General bed mobility comments: SBA for safety  Transfers Overall transfer level: Needs assistance Equipment used: Rolling walker (2 wheeled) Transfers: Sit to/from Stand Sit to Stand: Min guard         General transfer comment: CGA for safety with cues for safe walker use required  Ambulation/Gait Ambulation/Gait assistance: Min guard Ambulation Distance (Feet): 190 Feet Assistive device: Rolling walker (2 wheeled) Gait Pattern/deviations: Step-through pattern Gait velocity: mildly decreased   General Gait Details: consistent vc's required to stay closer to RW; no loss of balance noted with walker use   Stairs            Wheelchair Mobility    Modified Rankin (Stroke Patients Only)       Balance Overall balance assessment: Needs assistance Sitting-balance support: Bilateral upper extremity supported;Feet supported Sitting balance-Leahy Scale: Good     Standing balance support: Bilateral upper extremity supported;During functional activity (on RW) Standing balance-Leahy Scale: Good                      Cognition Arousal/Alertness: Awake/alert Behavior During Therapy: Flat affect Overall Cognitive Status: History of cognitive impairments - at baseline                      Exercises      General Comments General comments (skin integrity, edema, etc.): Sitter present in room with nephew present  Pt agreeable to PT session.  Pertinent Vitals/Pain Pain Assessment: No/denies pain  HR and O2 vitals WFL during session on room air.    Home Living                      Prior Function            PT Goals (current goals can now be found in the care plan section) Acute Rehab PT Goals Patient Stated Goal: to walk more PT Goal Formulation: With patient Time For Goal Achievement: 06/06/15 Potential to Achieve Goals:  Good Progress towards PT goals: Progressing toward goals    Frequency  Min 2X/week    PT Plan Discharge plan needs to be updated    Co-evaluation             End of Session Equipment Utilized During Treatment: Gait belt Activity Tolerance: Patient tolerated treatment well Patient left: in bed;with call bell/phone within reach;with bed alarm set;with nursing/sitter in room;with family/visitor present     Time: [id number] PT Time Calculation (min) (ACUTE ONLY): 15 min  Charges:  $Gait Training: 8-22 mins                    G CodesHendricks Limes:      Thania Woodlief 05/24/2015, 4:49 PM Hendricks LimesEmily Tatym Schermer, PT [phone]

## 2015-05-25 LAB — GLUCOSE, CAPILLARY
Glucose-Capillary: 149 mg/dL — ABNORMAL HIGH (ref 65–99)
Glucose-Capillary: 177 mg/dL — ABNORMAL HIGH (ref 65–99)
Glucose-Capillary: 292 mg/dL — ABNORMAL HIGH (ref 65–99)
Glucose-Capillary: 188 mg/dL — ABNORMAL HIGH (ref 65–99)

## 2015-05-25 LAB — BASIC METABOLIC PANEL
Anion gap: 5 (ref 5–15)
BUN: 13 mg/dL (ref 6–20)
CHLORIDE: 103 mmol/L (ref 101–111)
CO2: 26 mmol/L (ref 22–32)
CREATININE: 0.62 mg/dL (ref 0.44–1.00)
Calcium: 10.4 mg/dL — ABNORMAL HIGH (ref 8.9–10.3)
GFR calc non Af Amer: >60 mL/min (ref >60)
GLUCOSE: 160 mg/dL — AB (ref 65–99)
Potassium: 4.4 mmol/L (ref 3.5–5.1)
Sodium: 134 mmol/L — ABNORMAL LOW (ref 135–145)

## 2015-05-25 NOTE — Plan of Care (Signed)
Problem: Safety: Goal: Ability to remain free from injury will improve Outcome: Progressing Sitter at bedside, safety  precautions maintained.

## 2015-05-25 NOTE — Progress Notes (Signed)
[facility] [place] [facility]                                                                                                                                                                                            Patient Demographics   Jenna Robinson, is a 82 y.o. female, DOB - 03/26/1925, ZOX:[id number]  Admit date - 05/22/2015   Admitting Physician Ramonita Lab, MD  Outpatient Primary MD for the patient is [facility], Sydnee Cabal, MD   LOS - 3  Subjective: Denies any complaints.  Review of Systems:   CONSTITUTIONAL: No documented fever. Positive fatigue, positive weakness. No weight gain, no weight loss.  EYES: No blurry or double vision.  ENT: No tinnitus. No postnasal drip. No redness of the oropharynx.  RESPIRATORY: No cough, no wheeze, no hemoptysis. No dyspnea.  CARDIOVASCULAR: No chest pain. No orthopnea. No palpitations. No syncope.  GASTROINTESTINAL: No nausea, no vomiting or diarrhea. No abdominal pain. No melena or hematochezia.  GENITOURINARY: No dysuria or hematuria.  ENDOCRINE: No polyuria or nocturia. No heat or cold intolerance.  HEMATOLOGY: No anemia. No bruising. No bleeding.  INTEGUMENTARY: No rashes. No lesions.  MUSCULOSKELETAL: No arthritis. No swelling. No gout.  NEUROLOGIC: No numbness, tingling, or ataxia. No seizure-type activity.  PSYCHIATRIC: No anxiety. No insomnia. No ADD.    Vitals:   Filed Vitals:   05/24/15 1630 05/24/15 1942 05/25/15 0419 05/25/15 0500  BP:  131/48 130/62   Pulse: 75 59 61   Temp:  97.2 F (36.2 C) 97.8 F (36.6 C)   TempSrc:  Oral Oral   Resp:  22 16   Height:      Weight:    68.901 kg (151 lb 14.4 oz)  SpO2: 94% 95% 95%     Wt Readings from Last 3 Encounters:  05/25/15 68.901 kg (151 lb 14.4 oz)  01/05/15 79.516 kg (175 lb 4.8 oz)  06/14/14 79.833 kg (176 lb)     Intake/Output Summary (Last 24 hours) at 05/25/15 0759 Last data filed at 05/25/15 0500  Gross per 24 hour  Intake    720  ml  Output   1850 ml  Net  -1130 ml    Physical Exam:   GENERAL: Pleasant-appearing in no apparent distress. Weak appearing HEAD, EYES, EARS, NOSE AND THROAT: Atraumatic, normocephalic. Extraocular muscles are intact. Pupils equal and reactive to light. Sclerae anicteric. No conjunctival injection. No oro-pharyngeal erythema.  NECK: Supple. There is no jugular venous distention. No bruits, no lymphadenopathy, no thyromegaly.  HEART: Regular rate and rhythm,. No murmurs, no rubs, no clicks.  LUNGS: Clear to auscultation  bilaterally. No rales or rhonchi. No wheezes.  ABDOMEN: Soft, flat, nontender, nondistended. Has good bowel sounds. No hepatosplenomegaly appreciated.  EXTREMITIES: No evidence of any cyanosis, clubbing, or peripheral edema.  +2 pedal and radial pulses bilaterally.  NEUROLOGIC: The patient is alert, awake, and oriented x3 with no focal motor or sensory deficits appreciated bilaterally.  SKIN: Moist and warm with no rashes appreciated.  Psych: Not anxious, depressed LN: No inguinal LN enlargement    Antibiotics   Anti-infectives    Start     Dose/Rate Route Frequency Ordered Stop   05/22/15 1530  cefTRIAXone (ROCEPHIN) 1 g in dextrose 5 % 50 mL IVPB     1 g 100 mL/hr over 30 Minutes Intravenous Every 24 hours 05/22/15 1518        Medications   Scheduled Meds: . aspirin EC  81 mg Oral Daily  . atenolol  75 mg Oral Daily  . atorvastatin  20 mg Oral Daily  . cefTRIAXone (ROCEPHIN)  IV  1 g Intravenous Q24H  . docusate sodium  100 mg Oral BID  . enoxaparin (LOVENOX) injection  40 mg Subcutaneous Q24H  . insulin aspart  0-5 Units Subcutaneous QHS  . insulin aspart  0-9 Units Subcutaneous TID WC  . lisinopril  10 mg Oral Daily  . sodium chloride flush  3 mL Intravenous Q12H  . traZODone  25 mg Oral QHS   Continuous Infusions:   PRN Meds:.acetaminophen, ondansetron **OR** ondansetron (ZOFRAN) IV   Data Review:   Micro Results Recent Results (from the past  240 hour(s))  Urine culture     Status: Abnormal   Collection Time: 05/22/15 11:29 AM  Result Value Ref Range Status   Specimen Description URINE, CLEAN CATCH  Final   Special Requests NONE  Final   Culture MULTIPLE SPECIES PRESENT, SUGGEST RECOLLECTION (A)  Final   Report Status 05/24/2015 FINAL  Final  MRSA PCR Screening     Status: None   Collection Time: 05/22/15 11:07 PM  Result Value Ref Range Status   MRSA by PCR NEGATIVE NEGATIVE Final    Comment:        The GeneXpert MRSA Assay (FDA approved for NASAL specimens only), is one component of a comprehensive MRSA colonization surveillance program. It is not intended to diagnose MRSA infection nor to guide or monitor treatment for MRSA infections.   CULTURE, BLOOD (ROUTINE X 2) w Reflex to PCR ID Panel     Status: None (Preliminary result)   Collection Time: 05/23/15  9:55 AM  Result Value Ref Range Status   Specimen Description BLOOD RIGHT AC  Final   Special Requests BOTTLES DRAWN AEROBIC AND ANAEROBIC  Final   Culture NO GROWTH 1 DAY  Final   Report Status PENDING  Incomplete  CULTURE, BLOOD (ROUTINE X 2) w Reflex to PCR ID Panel     Status: None (Preliminary result)   Collection Time: 05/23/15 10:00 AM  Result Value Ref Range Status   Specimen Description BLOOD RIGHT HAND  Final   Special Requests   Final    BOTTLES DRAWN AEROBIC AND ANAEROBIC AER ANA   Culture NO GROWTH 1 DAY  Final   Report Status PENDING  Incomplete    Radiology Reports Dg Chest 2 View  05/24/2015  CLINICAL DATA:  Shortness of breath, weakness, hypotension. EXAM: CHEST - 2 VIEW COMPARISON:  01/01/2015 FINDINGS: The heart size and mediastinal contours are within normal limits. There is no evidence of pulmonary edema,  consolidation, pneumothorax, nodule or pleural fluid. The visualized skeletal structures are unremarkable. IMPRESSION: No active disease. Electronically Signed   By: Irish LackGlenn  Yamagata M.D.   On: 05/24/2015 08:21   Ct Head Wo  Contrast  05/22/2015  CLINICAL DATA:  Generalized weakness, dementia EXAM: CT HEAD WITHOUT CONTRAST TECHNIQUE: Contiguous axial images were obtained from the base of the skull through the vertex without intravenous contrast. COMPARISON:  01/01/2015 FINDINGS: No evidence of parenchymal hemorrhage or extra-axial fluid collection. No mass lesion, mass effect, or midline shift. No CT evidence of acute infarction. Subcortical white matter and periventricular small vessel ischemic changes. Intracranial atherosclerosis. Global cortical atrophy.  No ventriculomegaly. The visualized paranasal sinuses are essentially clear. The mastoid air cells are unopacified. No evidence of calvarial fracture. IMPRESSION: No evidence of acute intracranial abnormality. Atrophy with small vessel ischemic changes. Electronically Signed   By: Charline BillsSriyesh  Krishnan M.D.   On: 05/22/2015 15:36   Ct Abdomen Pelvis W Contrast  05/22/2015  CLINICAL DATA:  Generalized weakness, dementia EXAM: CT ABDOMEN AND PELVIS WITH CONTRAST TECHNIQUE: Multidetector CT imaging of the abdomen and pelvis was performed using the standard protocol following bolus administration of intravenous contrast. CONTRAST:  80mL ISOVUE-300 IOPAMIDOL (ISOVUE-300) INJECTION 61% COMPARISON:  None. FINDINGS: Lower chest:  Lung bases are clear. Hepatobiliary: Liver is within normal limits. Gallbladder is unremarkable. No intrahepatic or extrahepatic ductal dilatation. Pancreas: Within normal limits. Spleen: Within normal limits. Adrenals/Urinary Tract: Adrenal glands within normal limits. 3.3 cm left lower pole renal cyst. Right kidney is within normal limits. No hydronephrosis. Bladder is within normal limits. Stomach/Bowel: Stomach is within normal limits. No evidence of bowel obstruction. Normal appendix (series 2/ image 60). Sigmoid diverticulosis, without evidence of diverticulitis. Vascular/Lymphatic: Atherosclerotic calcifications of the abdominal aorta and branch vessels. No  evidence of abdominal aortic aneurysm. Small upper abdominal lymph nodes which do not meet pathologic CT size criteria. Reproductive: Status post supracervical hysterectomy. No adnexal masses. Other: No abdominopelvic ascites. Musculoskeletal: Degenerative changes of the visualized thoracolumbar spine. Lumbar levoscoliosis. IMPRESSION: No evidence of bowel obstruction.  Normal appendix. Sigmoid diverticulosis, without evidence of diverticulitis. Otherwise unremarkable CT abdomen/pelvis. Electronically Signed   By: Charline BillsSriyesh  Krishnan M.D.   On: 05/22/2015 15:34     CBC  Recent Labs Lab 05/22/15 1129 05/23/15 0448 05/24/15 0521  WBC 15.7* 8.4 5.4  HGB 14.9 13.1 12.3  HCT 44.0 37.0 35.9  PLT 267 147* 155  MCV 87.8 87.0 87.5  MCH 29.7 30.7 30.0  MCHC 33.8 35.3 34.2  RDW 13.7 13.3 13.3    Chemistries   Recent Labs Lab 05/22/15 1129 05/22/15 1302 05/22/15 1807 05/23/15 0448 05/23/15 1000 05/24/15 0521  NA 133*  --  132* 135  --  136  K 2.9*  --  4.1 3.5  --  2.9*  CL 92*  --  98* 104  --  101  CO2 31  --  27 27  --  29  GLUCOSE 143*  --  148* 72  --  167*  BUN 22*  --  19 14  --  8  CREATININE 1.07*  --  0.84 0.64  --  0.56  CALCIUM 14.3* 13.6* 12.3* 11.5*  --  10.0  MG  --   --   --  0.7* 2.0  --   AST  --   --   --  45*  --   --   ALT  --   --   --  31  --   --  ALKPHOS  --   --   --  60  --   --   BILITOT  --   --   --  0.6  --   --    ------------------------------------------------------------------------------------------------------------------ estimated creatinine clearance is 51.8 mL/min (by C-G formula based on Cr of 0.56). ------------------------------------------------------------------------------------------------------------------  Recent Labs  05/23/15 0448  HGBA1C 6.5*   ------------------------------------------------------------------------------------------------------------------ No results for input(s): CHOL, HDL, LDLCALC, TRIG, CHOLHDL,  LDLDIRECT in the last 72 hours. ------------------------------------------------------------------------------------------------------------------  Recent Labs  05/23/15 0448  TSH 2.782   ------------------------------------------------------------------------------------------------------------------ No results for input(s): VITAMINB12, FOLATE, FERRITIN, TIBC, IRON, RETICCTPCT in the last 72 hours.  Coagulation profile No results for input(s): INR, PROTIME in the last 168 hours.  No results for input(s): DDIMER in the last 72 hours.  Cardiac Enzymes  Recent Labs Lab 05/22/15 1302  TROPONINI <0.03   ------------------------------------------------------------------------------------------------------------------ Invalid input(s): POCBNP    Assessment & Plan   82 year old Caucasian female with past medical history of uterine cancer status post abdominal hysterectomy, diabetes mellitus is presenting to the ED for generalized weakness, off balance and having difficulty to get out of the bed. In the ED her calcium was found to be at 14 and potassium was at 2.9. Patient was given IV fluid boluses and hospitalist team is called to admit the patient.  # Altered mental status  Suspect due to combination of hypotension dehydration Now improved  # hypoglycemia  Improved conitnue to monitor ,Stop the insulin.   #Hypercalcemia  Due to dehydration Now normal  #Hypokalemia Continue to replace, recheck the labs.  #Insulin requiring diabetes mellitus Blood glucose low in the 50s early morning. I will stop her insulin   #History of uterine cancer status post hysterectomy CT scan of the abdomen shows no significant abnormality  #gerneralized weakness-     PT ecommends home health. #Chronic history of dementia Resume her home medication  Suspected UTI: Urine culture showed mixed species,Discontinue Rocephin. She had no fever or no WBC. He had metabolic encephalopathy secondary  to hypercalcemia, hypoglycemia, hypokalemia    Code Status Orders        Start     Ordered   05/22/15 1747  Do not attempt resuscitation (DNR)   Continuous    Question Answer Comment  In the event of cardiac or respiratory ARREST Do not call a "code blue"   In the event of cardiac or respiratory ARREST Do not perform Intubation, CPR, defibrillation or ACLS   In the event of cardiac or respiratory ARREST Use medication by any route, position, wound care, and other measures to relive pain and suffering. May use oxygen, suction and manual treatment of airway obstruction as needed for comfort.   Comments RN may pronounce      05/22/15 1746    Code Status History    Date Active Date Inactive Code Status Order ID Comments User Context   01/02/2015  1:58 AM 01/05/2015  4:30 PM Full Code [id number]  Arnaldo Natal, MD Inpatient    Advance Directive Documentation        Most Recent Value   Type of Advance Directive  Healthcare Power of Attorney   Pre-existing out of facility DNR order (yellow form or pink MOST form)     "MOST" Form in Place?             Consults   Physical trerapy   DVT Prophylaxis    Lab Results  Component Value Date   PLT 155 05/24/2015  Time Spent in minutes  ;25 min  Greater than 50% of time spent in care coordination and counseling patient regarding the condition and plan of care.   Katha Hamming M.D on 05/25/2015 at 7:59 AM  Between 7am to 6pm - Pager - [phone]  After 6pm go to [email or web address] - password [email or web address] [facility]  [facility]   Office  [phone]

## 2015-05-26 LAB — GLUCOSE, CAPILLARY
GLUCOSE-CAPILLARY: 158 mg/dL — AB (ref 65–99)
GLUCOSE-CAPILLARY: 233 mg/dL — AB (ref 65–99)
Glucose-Capillary: 220 mg/dL — ABNORMAL HIGH (ref 65–99)
Glucose-Capillary: 231 mg/dL — ABNORMAL HIGH (ref 65–99)

## 2015-05-26 NOTE — Progress Notes (Signed)
[facility] [place] [facility]                                                                                                                                                                                            Patient Demographics   Jenna Robinson, is a 82 y.o. female, DOB - 1925/03/26, UJW:[id number]  Admit date - 05/22/2015   Admitting Physician Ramonita Lab, MD  Outpatient Primary MD for the patient is [facility], Sydnee Cabal, MD   LOS - 4  Subjective: Denies any complaints.Patient has a sitter at bedside because of unstable gait when she gets up.  Review of Systems:   CONSTITUTIONAL: No documented fever. Positive fatigue, positive weakness. No weight gain, no weight loss.  EYES: No blurry or double vision.  ENT: No tinnitus. No postnasal drip. No redness of the oropharynx.  RESPIRATORY: No cough, no wheeze, no hemoptysis. No dyspnea.  CARDIOVASCULAR: No chest pain. No orthopnea. No palpitations. No syncope.  GASTROINTESTINAL: No nausea, no vomiting or diarrhea. No abdominal pain. No melena or hematochezia.  GENITOURINARY: No dysuria or hematuria.  ENDOCRINE: No polyuria or nocturia. No heat or cold intolerance.  HEMATOLOGY: No anemia. No bruising. No bleeding.  INTEGUMENTARY: No rashes. No lesions.  MUSCULOSKELETAL: No arthritis. No swelling. No gout.  NEUROLOGIC: No numbness, tingling, or ataxia. No seizure-type activity.  PSYCHIATRIC: No anxiety. No insomnia. No ADD.    Vitals:   Filed Vitals:   05/25/15 2009 05/26/15 0352 05/26/15 0500 05/26/15 0718  BP: 154/60 145/58  127/51  Pulse: 61 64  51  Temp: 98.2 F (36.8 C) 98.2 F (36.8 C)    TempSrc: Oral Oral    Resp: 18 18    Height:      Weight:   68.04 kg (150 lb)   SpO2: 96% 97%      Wt Readings from Last 3 Encounters:  05/26/15 68.04 kg (150 lb)  01/05/15 79.516 kg (175 lb 4.8 oz)  06/14/14 79.833 kg (176 lb)     Intake/Output Summary (Last 24 hours) at 05/26/15 0728 Last data  filed at 05/26/15 0352  Gross per 24 hour  Intake    960 ml  Output   1700 ml  Net   -740 ml    Physical Exam:   GENERAL: Pleasant-appearing in no apparent distress. Weak appearing HEAD, EYES, EARS, NOSE AND THROAT: Atraumatic, normocephalic. Extraocular muscles are intact. Pupils equal and reactive to light. Sclerae anicteric. No conjunctival injection. No oro-pharyngeal erythema.  NECK: Supple. There is no jugular venous distention. No bruits, no lymphadenopathy, no thyromegaly.  HEART: Regular rate and rhythm,. No  murmurs, no rubs, no clicks.  LUNGS: Clear to auscultation bilaterally. No rales or rhonchi. No wheezes.  ABDOMEN: Soft, flat, nontender, nondistended. Has good bowel sounds. No hepatosplenomegaly appreciated.  EXTREMITIES: No evidence of any cyanosis, clubbing, or peripheral edema.  +2 pedal and radial pulses bilaterally.  NEUROLOGIC: The patient is alert, awake, and oriented x3 with no focal motor or sensory deficits appreciated bilaterally.  SKIN: Moist and warm with no rashes appreciated.  Psych: Not anxious, depressed LN: No inguinal LN enlargement    Antibiotics   Anti-infectives    Start     Dose/Rate Route Frequency Ordered Stop   05/22/15 1530  cefTRIAXone (ROCEPHIN) 1 g in dextrose 5 % 50 mL IVPB  Status:  Discontinued     1 g 100 mL/hr over 30 Minutes Intravenous Every 24 hours 05/22/15 1518 05/25/15 0803      Medications   Scheduled Meds: . aspirin EC  81 mg Oral Daily  . atenolol  75 mg Oral Daily  . atorvastatin  20 mg Oral Daily  . docusate sodium  100 mg Oral BID  . enoxaparin (LOVENOX) injection  40 mg Subcutaneous Q24H  . insulin aspart  0-5 Units Subcutaneous QHS  . insulin aspart  0-9 Units Subcutaneous TID WC  . lisinopril  10 mg Oral Daily  . sodium chloride flush  3 mL Intravenous Q12H  . traZODone  25 mg Oral QHS   Continuous Infusions:   PRN Meds:.acetaminophen, ondansetron **OR** ondansetron (ZOFRAN) IV   Data Review:   Micro  Results Recent Results (from the past 240 hour(s))  Urine culture     Status: Abnormal   Collection Time: 05/22/15 11:29 AM  Result Value Ref Range Status   Specimen Description URINE, CLEAN CATCH  Final   Special Requests NONE  Final   Culture MULTIPLE SPECIES PRESENT, SUGGEST RECOLLECTION (A)  Final   Report Status 05/24/2015 FINAL  Final  MRSA PCR Screening     Status: None   Collection Time: 05/22/15 11:07 PM  Result Value Ref Range Status   MRSA by PCR NEGATIVE NEGATIVE Final    Comment:        The GeneXpert MRSA Assay (FDA approved for NASAL specimens only), is one component of a comprehensive MRSA colonization surveillance program. It is not intended to diagnose MRSA infection nor to guide or monitor treatment for MRSA infections.   CULTURE, BLOOD (ROUTINE X 2) w Reflex to PCR ID Panel     Status: None (Preliminary result)   Collection Time: 05/23/15  9:55 AM  Result Value Ref Range Status   Specimen Description BLOOD RIGHT AC  Final   Special Requests BOTTLES DRAWN AEROBIC AND ANAEROBIC  Final   Culture NO GROWTH 2 DAYS  Final   Report Status PENDING  Incomplete  CULTURE, BLOOD (ROUTINE X 2) w Reflex to PCR ID Panel     Status: None (Preliminary result)   Collection Time: 05/23/15 10:00 AM  Result Value Ref Range Status   Specimen Description BLOOD RIGHT HAND  Final   Special Requests   Final    BOTTLES DRAWN AEROBIC AND ANAEROBIC AER ANA   Culture NO GROWTH 2 DAYS  Final   Report Status PENDING  Incomplete    Radiology Reports Dg Chest 2 View  05/24/2015  CLINICAL DATA:  Shortness of breath, weakness, hypotension. EXAM: CHEST - 2 VIEW COMPARISON:  01/01/2015 FINDINGS: The heart size and mediastinal contours are within normal limits. There is no evidence  of pulmonary edema, consolidation, pneumothorax, nodule or pleural fluid. The visualized skeletal structures are unremarkable. IMPRESSION: No active disease. Electronically Signed   By: Irish Lack  M.D.   On: 05/24/2015 08:21   Ct Head Wo Contrast  05/22/2015  CLINICAL DATA:  Generalized weakness, dementia EXAM: CT HEAD WITHOUT CONTRAST TECHNIQUE: Contiguous axial images were obtained from the base of the skull through the vertex without intravenous contrast. COMPARISON:  01/01/2015 FINDINGS: No evidence of parenchymal hemorrhage or extra-axial fluid collection. No mass lesion, mass effect, or midline shift. No CT evidence of acute infarction. Subcortical white matter and periventricular small vessel ischemic changes. Intracranial atherosclerosis. Global cortical atrophy.  No ventriculomegaly. The visualized paranasal sinuses are essentially clear. The mastoid air cells are unopacified. No evidence of calvarial fracture. IMPRESSION: No evidence of acute intracranial abnormality. Atrophy with small vessel ischemic changes. Electronically Signed   By: Charline Bills M.D.   On: 05/22/2015 15:36   Ct Abdomen Pelvis W Contrast  05/22/2015  CLINICAL DATA:  Generalized weakness, dementia EXAM: CT ABDOMEN AND PELVIS WITH CONTRAST TECHNIQUE: Multidetector CT imaging of the abdomen and pelvis was performed using the standard protocol following bolus administration of intravenous contrast. CONTRAST:  80mL ISOVUE-300 IOPAMIDOL (ISOVUE-300) INJECTION 61% COMPARISON:  None. FINDINGS: Lower chest:  Lung bases are clear. Hepatobiliary: Liver is within normal limits. Gallbladder is unremarkable. No intrahepatic or extrahepatic ductal dilatation. Pancreas: Within normal limits. Spleen: Within normal limits. Adrenals/Urinary Tract: Adrenal glands within normal limits. 3.3 cm left lower pole renal cyst. Right kidney is within normal limits. No hydronephrosis. Bladder is within normal limits. Stomach/Bowel: Stomach is within normal limits. No evidence of bowel obstruction. Normal appendix (series 2/ image 60). Sigmoid diverticulosis, without evidence of diverticulitis. Vascular/Lymphatic: Atherosclerotic calcifications of  the abdominal aorta and branch vessels. No evidence of abdominal aortic aneurysm. Small upper abdominal lymph nodes which do not meet pathologic CT size criteria. Reproductive: Status post supracervical hysterectomy. No adnexal masses. Other: No abdominopelvic ascites. Musculoskeletal: Degenerative changes of the visualized thoracolumbar spine. Lumbar levoscoliosis. IMPRESSION: No evidence of bowel obstruction.  Normal appendix. Sigmoid diverticulosis, without evidence of diverticulitis. Otherwise unremarkable CT abdomen/pelvis. Electronically Signed   By: Charline Bills M.D.   On: 05/22/2015 15:34     CBC  Recent Labs Lab 05/22/15 1129 05/23/15 0448 05/24/15 0521  WBC 15.7* 8.4 5.4  HGB 14.9 13.1 12.3  HCT 44.0 37.0 35.9  PLT 267 147* 155  MCV 87.8 87.0 87.5  MCH 29.7 30.7 30.0  MCHC 33.8 35.3 34.2  RDW 13.7 13.3 13.3    Chemistries   Recent Labs Lab 05/22/15 1129 05/22/15 1302 05/22/15 1807 05/23/15 0448 05/23/15 1000 05/24/15 0521 05/25/15 0818  NA 133*  --  132* 135  --  136 134*  K 2.9*  --  4.1 3.5  --  2.9* 4.4  CL 92*  --  98* 104  --  101 103  CO2 31  --  27 27  --  29 26  GLUCOSE 143*  --  148* 72  --  167* 160*  BUN 22*  --  19 14  --  8 13  CREATININE 1.07*  --  0.84 0.64  --  0.56 0.62  CALCIUM 14.3* 13.6* 12.3* 11.5*  --  10.0 10.4*  MG  --   --   --  0.7* 2.0  --   --   AST  --   --   --  45*  --   --   --  ALT  --   --   --  31  --   --   --   ALKPHOS  --   --   --  47  --   --   --   BILITOT  --   --   --  0.6  --   --   --    ------------------------------------------------------------------------------------------------------------------ estimated creatinine clearance is 51.8 mL/min (by C-G formula based on Cr of 0.62). ------------------------------------------------------------------------------------------------------------------ No results for input(s): HGBA1C in the last 72  hours. ------------------------------------------------------------------------------------------------------------------ No results for input(s): CHOL, HDL, LDLCALC, TRIG, CHOLHDL, LDLDIRECT in the last 72 hours. ------------------------------------------------------------------------------------------------------------------ No results for input(s): TSH, T4TOTAL, T3FREE, THYROIDAB in the last 72 hours.  Invalid input(s): FREET3 ------------------------------------------------------------------------------------------------------------------ No results for input(s): VITAMINB12, FOLATE, FERRITIN, TIBC, IRON, RETICCTPCT in the last 72 hours.  Coagulation profile No results for input(s): INR, PROTIME in the last 168 hours.  No results for input(s): DDIMER in the last 72 hours.  Cardiac Enzymes  Recent Labs Lab 05/22/15 1302  TROPONINI <0.03   ------------------------------------------------------------------------------------------------------------------ Invalid input(s): POCBNP    Assessment & Plan   82 year old Caucasian female with past medical history of uterine cancer status post abdominal hysterectomy, diabetes mellitus is presenting to the ED for generalized weakness, off balance and having difficulty to get out of the bed. In the ED her calcium was found to be at 14 and potassium was at 2.9. Patient was given IV fluid boluses and hospitalist team is called to admit the patient.  # Altered mental status  Suspect due to combination of hypotension dehydration Now improved  # hypoglycemia  Improved conitnue to monitor ,Stop the insulin.   #Hypercalcemia  Due to dehydration Now normal  #Hypokalemia Continue to replace, recheck the labs.  #Insulin requiring diabetes mellitus Blood glucose low in the 50s early morning. I will stop her insulin   #History of uterine cancer status post hysterectomy CT scan of the abdomen shows no significant  abnormality  #gerneralized weakness-   Physical therapy recommends 24-hour home health. Patient is herself. We'll talk to Child psychotherapist for about skilled nursing for short time. And also talk to family. If They can provide 24-hour assistance. Marland Kitchen #Chronic history of dementia Resume her home medication  Suspected UTI: Urine culture showed mixed species,Discontinue Rocephin. She had no fever or no WBC. She  had metabolic encephalopathy secondary to hypercalcemia, hypoglycemia, hypokalemia     Code Status Orders        Start     Ordered   05/22/15 1747  Do not attempt resuscitation (DNR)   Continuous    Question Answer Comment  In the event of cardiac or respiratory ARREST Do not call a "code blue"   In the event of cardiac or respiratory ARREST Do not perform Intubation, CPR, defibrillation or ACLS   In the event of cardiac or respiratory ARREST Use medication by any route, position, wound care, and other measures to relive pain and suffering. May use oxygen, suction and manual treatment of airway obstruction as needed for comfort.   Comments RN may pronounce      05/22/15 1746    Code Status History    Date Active Date Inactive Code Status Order ID Comments User Context   01/02/2015  1:58 AM 01/05/2015  4:30 PM Full Code [id number]  Arnaldo Natal, MD Inpatient    Advance Directive Documentation        Most Recent Value   Type of Advance Directive  Healthcare Power of  Attorney   Pre-existing out of facility DNR order (yellow form or pink MOST form)     "MOST" Form in Place?             Consults   Physical trerapy   DVT Prophylaxis    Lab Results  Component Value Date   PLT 155 05/24/2015     Time Spent in minutes  ;25 min  Greater than 50% of time spent in care coordination and counseling patient regarding the condition and plan of care.   Katha HammingKONIDENA,Marylene Masek M.D on 05/26/2015 at 7:28 AM  Between 7am to 6pm - Pager - [phone]  After 6pm go to  [email or web address] - password [email or web address] [facility]  [facility]   Office  [phone]

## 2015-05-26 NOTE — Progress Notes (Signed)
NSR. Room air. Takes meds whole with water. A & O but can be forgeful at times. FS are stable. High fall risk. Family updated that pt will d/c tomorrow. [facility] and [facility] well. Ambulated around the nurses station and tolerated it well.Pt has no further concerns at this time.

## 2015-05-27 LAB — GLUCOSE, CAPILLARY: GLUCOSE-CAPILLARY: 168 mg/dL — AB (ref 65–99)

## 2015-05-27 NOTE — Discharge Summary (Signed)
Jenna Robinson, is a 82 y.o. female  DOB [birth year]  MRN [id number].  Admission date:  05/22/2015  Admitting Physician  Nicholes Mango, MD  Discharge Date:  05/27/2015   Primary MD  Elba Barman, MD  Recommendations for primary care physician for things to follow:  Follow-up with primary doctor in 1 week   Admission Diagnosis  Hypercalcemia [E83.52] Hypokalemia [E87.6] Altered mental status [R41.82] Generalized weakness [R53.1]   Discharge Diagnosis  Hypercalcemia [E83.52] Hypokalemia [E87.6] Altered mental status [R41.82] Generalized weakness [R53.1]    Active Problems:   Hypercalcemia      Past Medical History  Diagnosis Date  . Diabetes mellitus without complication ([place])   . Hypertension     Past Surgical History  Procedure Laterality Date  . Abdominal hysterectomy         History of present illness and  Hospital Course:     Kindly see H&P for history of present illness and admission details, please review complete Labs, Consult reports and Test reports for all details in brief  HPI  from the history and physical done on the day of admission 82 year old the female patient admitted on May 10th  for generalized weakness.  Hospital Course  #1 generalized weakness with ambulatory difficulties: Secondary to hypercalcemia hypokalemia. Patient calcium was 14, potassium 2.9 on admission. Patient has dementia and it is unknown that if she has overdosed on calcium supplements. Abdominal CAT scan on admission did not show any abnormality. She also was hypotensive with blood pressure 80/50. Patient received aggressive hydration, potassium supplementation. Patient also had severe hypomagnesemia with magnesium of 0.7 on admission. With aggressive hydration patient the potassium improved to 4.4, magnesium  2-2.0, calcium also decreased to 10 .4. I have stopped the calcium supplementation at discharge. Patient is ready to be discharged, patient lives at independent facility taken care of by 24 x 7 caregivers. I called patient's power of attorney  Jenna Robinson and informed her about the discharge.  #2. Hypotension on admission improved with IV hydration. Blood pressure is improved today to 161/65. Patient can resume her atenolol #3 altered mental status with metabolic encephalopathy secondary to multiple  Electrolyte  abnormalities with hypercalcemia, hypokalemia, hypomagnesemia: Patient back to baseline and does have some dementia.  #4 suspected UTI: Patient urine culture showed mixed this patient is. Received the Rocephin while in the hospital but discontinued as urine cultures are negative.  #5 diabetes mellitus type 2: Patient had blood sugar of 68 on on arrival. But did not have any hypoglycemia in the hospital. Levemir, metformin was on hold since admission secondary to her the metabolic encephalopathy and poor by mouth intake and do to prevent hypoglycemia. But now she is able to eat well, blood glucose is in 200 range. She is getting sliding scale coverage in the hospital but when she goes home she can continue Levemir, metformin but levemir can be  adjusted according to her  sugars. 4.DNR   Discharge Condition: Stable   Follow UP      Discharge Instructions  and  Discharge Medications        Medication List    STOP taking these medications        CALCIUM 600+D 600-400 MG-UNIT tablet  Generic drug:  Calcium Carbonate-Vitamin D      TAKE these medications        acetaminophen 325 MG tablet  Commonly known as:  TYLENOL  Take 650 mg by mouth every 6 (six) hours as needed for  mild pain, moderate pain, fever or headache.     aspirin EC 81 MG tablet  Take 81 mg by mouth daily.     atenolol 25 MG tablet  Commonly known as:  TENORMIN  Take 75 mg by mouth daily.      atorvastatin 20 MG tablet  Commonly known as:  LIPITOR  Take 1 tablet (20 mg total) by mouth daily.     insulin detemir 100 unit/ml Soln  Commonly known as:  LEVEMIR  Inject 0.16 mLs (16 Units total) into the skin daily.     lisinopril 10 MG tablet  Commonly known as:  PRINIVIL,ZESTRIL  Take 1 tablet by mouth daily.     metFORMIN 500 MG (MOD) 24 hr tablet  Commonly known as:  GLUMETZA  Take 1,000 mg by mouth 2 (two) times daily.     Nicotine 21-14-7 MG/24HR Kit  Place 21 mg onto the skin daily.     traZODone 50 MG tablet  Commonly known as:  DESYREL  Take 25 mg by mouth at bedtime.     Vitamin D (Ergocalciferol) 50000 units Caps capsule  Commonly known as:  DRISDOL  Take 50,000 Units by mouth every 7 (seven) days.          Diet and Activity recommendation: See Discharge Instructions above   Consults obtained - none   Major procedures and Radiology Reports - PLEASE review detailed and final reports for all details, in brief -      Dg Chest 2 View  05/24/2015  CLINICAL DATA:  Shortness of breath, weakness, hypotension. EXAM: CHEST - 2 VIEW COMPARISON:  01/01/2015 FINDINGS: The heart size and mediastinal contours are within normal limits. There is no evidence of pulmonary edema, consolidation, pneumothorax, nodule or pleural fluid. The visualized skeletal structures are unremarkable. IMPRESSION: No active disease. Electronically Signed   By: Aletta Edouard M.D.   On: 05/24/2015 08:21   Ct Head Wo Contrast  05/22/2015  CLINICAL DATA:  Generalized weakness, dementia EXAM: CT HEAD WITHOUT CONTRAST TECHNIQUE: Contiguous axial images were obtained from the base of the skull through the vertex without intravenous contrast. COMPARISON:  01/01/2015 FINDINGS: No evidence of parenchymal hemorrhage or extra-axial fluid collection. No mass lesion, mass effect, or midline shift. No CT evidence of acute infarction. Subcortical white matter and periventricular small vessel ischemic  changes. Intracranial atherosclerosis. Global cortical atrophy.  No ventriculomegaly. The visualized paranasal sinuses are essentially clear. The mastoid air cells are unopacified. No evidence of calvarial fracture. IMPRESSION: No evidence of acute intracranial abnormality. Atrophy with small vessel ischemic changes. Electronically Signed   By: Julian Hy M.D.   On: 05/22/2015 15:36   Ct Abdomen Pelvis W Contrast  05/22/2015  CLINICAL DATA:  Generalized weakness, dementia EXAM: CT ABDOMEN AND PELVIS WITH CONTRAST TECHNIQUE: Multidetector CT imaging of the abdomen and pelvis was performed using the standard protocol following bolus administration of intravenous contrast. CONTRAST:  26m ISOVUE-300 IOPAMIDOL (ISOVUE-300) INJECTION 61% COMPARISON:  None. FINDINGS: Lower chest:  Lung bases are clear. Hepatobiliary: Liver is within normal limits. Gallbladder is unremarkable. No intrahepatic or extrahepatic ductal dilatation. Pancreas: Within normal limits. Spleen: Within normal limits. Adrenals/Urinary Tract: Adrenal glands within normal limits. 3.3 cm left lower pole renal cyst. Right kidney is within normal limits. No hydronephrosis. Bladder is within normal limits. Stomach/Bowel: Stomach is within normal limits. No evidence of bowel obstruction. Normal appendix (series 2/ image 60). Sigmoid diverticulosis, without evidence of diverticulitis. Vascular/Lymphatic: Atherosclerotic calcifications of the abdominal aorta and branch  vessels. No evidence of abdominal aortic aneurysm. Small upper abdominal lymph nodes which do not meet pathologic CT size criteria. Reproductive: Status post supracervical hysterectomy. No adnexal masses. Other: No abdominopelvic ascites. Musculoskeletal: Degenerative changes of the visualized thoracolumbar spine. Lumbar levoscoliosis. IMPRESSION: No evidence of bowel obstruction.  Normal appendix. Sigmoid diverticulosis, without evidence of diverticulitis. Otherwise unremarkable CT  abdomen/pelvis. Electronically Signed   By: Julian Hy M.D.   On: 05/22/2015 15:34    Micro Results     Recent Results (from the past 240 hour(s))  Urine culture     Status: Abnormal   Collection Time: 05/22/15 11:29 AM  Result Value Ref Range Status   Specimen Description URINE, CLEAN CATCH  Final   Special Requests NONE  Final   Culture MULTIPLE SPECIES PRESENT, SUGGEST RECOLLECTION (A)  Final   Report Status 05/24/2015 FINAL  Final  MRSA PCR Screening     Status: None   Collection Time: 05/22/15 11:07 PM  Result Value Ref Range Status   MRSA by PCR NEGATIVE NEGATIVE Final    Comment:        The GeneXpert MRSA Assay (FDA approved for NASAL specimens only), is one component of a comprehensive MRSA colonization surveillance program. It is not intended to diagnose MRSA infection nor to guide or monitor treatment for MRSA infections.   CULTURE, BLOOD (ROUTINE X 2) w Reflex to PCR ID Panel     Status: None (Preliminary result)   Collection Time: 05/23/15  9:55 AM  Result Value Ref Range Status   Specimen Description BLOOD RIGHT AC  Final   Special Requests BOTTLES DRAWN AEROBIC AND ANAEROBIC 6ML  Final   Culture NO GROWTH 4 DAYS  Final   Report Status PENDING  Incomplete  CULTURE, BLOOD (ROUTINE X 2) w Reflex to PCR ID Panel     Status: None (Preliminary result)   Collection Time: 05/23/15 10:00 AM  Result Value Ref Range Status   Specimen Description BLOOD RIGHT HAND  Final   Special Requests   Final    BOTTLES DRAWN AEROBIC AND ANAEROBIC AER 5ML ANA 6ML   Culture NO GROWTH 4 DAYS  Final   Report Status PENDING  Incomplete       Today   Subjective:   Jenna Robinson today has baseline dementia but stable, for discharge  Objective:   Blood pressure 161/65, pulse 54, temperature 97.8 F (36.6 C), temperature source Oral, resp. rate 17, height 5' 9"  (1.753 m), weight 68.13 kg (150 lb 3.2 oz), SpO2 99 %.   Intake/Output Summary (Last 24 hours) at 05/27/15  1035 Last data filed at 05/27/15 0933  Gross per 24 hour  Intake    360 ml  Output   2265 ml  Net  -1905 ml    Exam Awake Alert, Oriented x 3, No new F.N deficits, Normal affect [place].AT,PERRAL Supple Neck,No JVD, No cervical lymphadenopathy appriciated.  Symmetrical Chest wall movement, Good air movement bilaterally, CTAB RRR,No Gallops,Rubs or new Murmurs, No Parasternal Heave +ve B.Sounds, Abd Soft, Non tender, No organomegaly appriciated, No rebound -guarding or rigidity. No Cyanosis, Clubbing or edema, No new Rash or bruise  Data Review   CBC w Diff: Lab Results  Component Value Date   WBC 5.4 05/24/2015   WBC 8.6 01/09/2013   HGB 12.3 05/24/2015   HGB 14.5 01/09/2013   HCT 35.9 05/24/2015   HCT 42.5 01/09/2013   PLT 155 05/24/2015   PLT 212 01/09/2013   LYMPHOPCT 3 01/01/2015  LYMPHOPCT 28.9 01/09/2013   MONOPCT 9 01/01/2015   MONOPCT 8.3 01/09/2013   EOSPCT 0 01/01/2015   EOSPCT 1.8 01/09/2013   BASOPCT 0 01/01/2015   BASOPCT 0.7 01/09/2013    CMP: Lab Results  Component Value Date   NA 134* 05/25/2015   NA 131* 01/09/2013   K 4.4 05/25/2015   K 3.8 01/09/2013   CL 103 05/25/2015   CL 96* 01/09/2013   CO2 26 05/25/2015   CO2 26 01/09/2013   BUN 13 05/25/2015   BUN 14 01/09/2013   CREATININE 0.62 05/25/2015   CREATININE 0.76 01/09/2013   PROT 5.9* 05/23/2015   ALBUMIN 3.5 05/23/2015   BILITOT 0.6 05/23/2015   ALKPHOS 47 05/23/2015   AST 45* 05/23/2015   ALT 31 05/23/2015  .   Total Time in preparing paper work, data evaluation and todays exam - 72 minutes  Jenna Robinson M.D on 05/27/2015 at 10:35 AM    Note: This dictation was prepared with Dragon dictation along with smaller phrase technology. Any transcriptional errors that result from this process are unintentional.

## 2015-05-27 NOTE — Care Management (Signed)
Discharging back to family care home today. [facility] to resume care for SN and add PT. Abby with [facility] notified.

## 2015-05-27 NOTE — Care Management Important Message (Signed)
Important Message  Patient Details  Name: Jenna Robinson MRN: [id number] Date of Birth: 30-Nov-1925   Medicare Important Message Given:  Yes    [name] 05/27/2015, 11:00 AM

## 2015-05-27 NOTE — Progress Notes (Signed)
Alert to self only. Room air. NSR. Pt has not reported any pain. Takes meds ok. High risk. FS are stable. Pt has no further concerns at this time. IV and tele removed. [facility]

## 2015-05-28 LAB — CULTURE, BLOOD (ROUTINE X 2)
Culture: NO GROWTH
Culture: NO GROWTH

## 2015-06-10 ENCOUNTER — Emergency Department: Payer: Medicare Other

## 2015-06-10 ENCOUNTER — Encounter: Payer: Self-pay | Admitting: Emergency Medicine

## 2015-06-10 ENCOUNTER — Emergency Department
Admission: EM | Admit: 2015-06-10 | Discharge: 2015-06-10 | Disposition: A | Discharge status: Home / Self Care | Payer: Medicare Other | Attending: Emergency Medicine | Admitting: Emergency Medicine

## 2015-06-10 DIAGNOSIS — Z7984 Long term (current) use of oral hypoglycemic drugs: Secondary | ICD-10-CM | POA: Diagnosis not present

## 2015-06-10 DIAGNOSIS — S61411A Laceration without foreign body of right hand, initial encounter: Secondary | ICD-10-CM | POA: Insufficient documentation

## 2015-06-10 DIAGNOSIS — Z794 Long term (current) use of insulin: Secondary | ICD-10-CM | POA: Insufficient documentation

## 2015-06-10 DIAGNOSIS — Y929 Unspecified place or not applicable: Secondary | ICD-10-CM | POA: Insufficient documentation

## 2015-06-10 DIAGNOSIS — E119 Type 2 diabetes mellitus without complications: Secondary | ICD-10-CM | POA: Diagnosis not present

## 2015-06-10 DIAGNOSIS — Z7982 Long term (current) use of aspirin: Secondary | ICD-10-CM | POA: Diagnosis not present

## 2015-06-10 DIAGNOSIS — Z79899 Other long term (current) drug therapy: Secondary | ICD-10-CM | POA: Diagnosis not present

## 2015-06-10 DIAGNOSIS — W19XXXA Unspecified fall, initial encounter: Secondary | ICD-10-CM | POA: Diagnosis not present

## 2015-06-10 DIAGNOSIS — Y999 Unspecified external cause status: Secondary | ICD-10-CM | POA: Diagnosis not present

## 2015-06-10 DIAGNOSIS — G309 Alzheimer's disease, unspecified: Secondary | ICD-10-CM | POA: Diagnosis not present

## 2015-06-10 DIAGNOSIS — I10 Essential (primary) hypertension: Secondary | ICD-10-CM | POA: Insufficient documentation

## 2015-06-10 DIAGNOSIS — Y939 Activity, unspecified: Secondary | ICD-10-CM | POA: Insufficient documentation

## 2015-06-10 HISTORY — DX: Dementia in other diseases classified elsewhere, unspecified severity, without behavioral disturbance, psychotic disturbance, mood disturbance, and anxiety: F02.80

## 2015-06-10 HISTORY — DX: Alzheimer's disease, unspecified: G30.9

## 2015-06-10 LAB — COMPREHENSIVE METABOLIC PANEL
ALBUMIN: 3.8 g/dL (ref 3.5–5.0)
ALK PHOS: 79 U/L (ref 38–126)
ALT: 27 U/L (ref 14–54)
ANION GAP: 7 (ref 5–15)
AST: 38 U/L (ref 15–41)
BUN: 13 mg/dL (ref 6–20)
CALCIUM: 11.5 mg/dL — AB (ref 8.9–10.3)
CO2: 27 mmol/L (ref 22–32)
Chloride: 100 mmol/L — ABNORMAL LOW (ref 101–111)
Creatinine, Ser: 0.72 mg/dL (ref 0.44–1.00)
GFR calc non Af Amer: >60 mL/min (ref >60)
Glucose, Bld: 126 mg/dL — ABNORMAL HIGH (ref 65–99)
POTASSIUM: 4 mmol/L (ref 3.5–5.1)
SODIUM: 134 mmol/L — AB (ref 135–145)
Total Bilirubin: 0.9 mg/dL (ref 0.3–1.2)
Total Protein: 6.9 g/dL (ref 6.5–8.1)

## 2015-06-10 LAB — CBC WITH DIFFERENTIAL/PLATELET
BASOS ABS: 0.1 10*3/uL (ref 0–0.1)
Eosinophils Absolute: 0.2 10*3/uL (ref 0–0.7)
Eosinophils Relative: 2 %
HEMATOCRIT: 37.9 % (ref 35.0–47.0)
HEMOGLOBIN: 12.7 g/dL (ref 12.0–16.0)
Lymphs Abs: 1.3 10*3/uL (ref 1.0–3.6)
MCH: 30.2 pg (ref 26.0–34.0)
MCHC: 33.4 g/dL (ref 32.0–36.0)
MCV: 90.4 fL (ref 80.0–100.0)
Monocytes Absolute: 1 10*3/uL — ABNORMAL HIGH (ref 0.2–0.9)
NEUTROS ABS: 5.5 10*3/uL (ref 1.4–6.5)
Platelets: 261 10*3/uL (ref 150–440)
RBC: 4.19 MIL/uL (ref 3.80–5.20)
RDW: 13.7 % (ref 11.5–14.5)
WBC: 8 10*3/uL (ref 3.6–11.0)

## 2015-06-10 NOTE — ED Provider Notes (Signed)
[facility] Emergency Department Provider Note   ____________________________________________  Time seen: Approximately 10:55 AM  I have reviewed the triage vital signs and the nursing notes.   HISTORY  Chief Complaint Fall History is given by family member.  HPI Jenna Robinson is a 82 y.o. female is brought in by family members this morning with a history of patient having a "near fall" and being called by a caregiver causing a skin tear to her hand that occurred yesterday.Patient has dementia and is unable to give a history. Family member states that patient was in the hospital over Mother's Day weekend and had several lab abnormalities. Caregiver that was with patient on the day that she had her skin tear is unavailable at this time but is unknown at this time is patient had a syncopal episode or was tripping. Family states that patient has remained her normal level of mental capacity. There is been no reported nausea or vomiting. Patient gives verbal but is not able to carry on a conversation. Patient is continued to eat and drink as normal. There is been no history of nausea vomiting.   Past Medical History  Diagnosis Date  . Diabetes mellitus without complication ([place])   . Hypertension   . Alzheimer disease     Patient Active Problem List   Diagnosis Date Noted  . Hypercalcemia 05/22/2015  . Elevated troponin 01/02/2015  . Rhabdomyolysis 01/02/2015    Past Surgical History  Procedure Laterality Date  . Abdominal hysterectomy      Current Outpatient Rx  Name  Route  Sig  Dispense  Refill  . acetaminophen (TYLENOL) 325 MG tablet   Oral   Take 650 mg by mouth every 6 (six) hours as needed for mild pain, moderate pain, fever or headache.         Marland Kitchen aspirin EC 81 MG tablet   Oral   Take 81 mg by mouth daily.         Marland Kitchen atenolol (TENORMIN) 25 MG tablet   Oral   Take 75 mg by mouth daily.          Marland Kitchen atorvastatin (LIPITOR) 20 MG  tablet   Oral   Take 1 tablet (20 mg total) by mouth daily. Patient not taking: Reported on 05/22/2015   30 tablet   0   . insulin detemir (LEVEMIR) 100 unit/ml SOLN   Subcutaneous   Inject 0.16 mLs (16 Units total) into the skin daily. Patient taking differently: Inject 30 Units into the skin at bedtime.    100 mL   0   . lisinopril (PRINIVIL,ZESTRIL) 10 MG tablet   Oral   Take 1 tablet by mouth daily.         . metFORMIN (GLUMETZA) 500 MG (MOD) 24 hr tablet   Oral   Take 1,000 mg by mouth 2 (two) times daily.          . Nicotine 21-14-7 MG/24HR KIT   Transdermal   Place 21 mg onto the skin daily. Patient not taking: Reported on 05/22/2015   30 each   2   . traZODone (DESYREL) 50 MG tablet   Oral   Take 25 mg by mouth at bedtime.         . Vitamin D, Ergocalciferol, (DRISDOL) 50000 units CAPS capsule   Oral   Take 50,000 Units by mouth every 7 (seven) days.           Allergies Review of patient's allergies  indicates no known allergies.  Family History  Problem Relation Age of Onset  . Cancer      multiple family members including all siblings    Social History Social History  Substance Use Topics  . Smoking status: Never Smoker   . Smokeless tobacco: None  . Alcohol Use: No    Review of Systems Constitutional: No fever/chills Eyes: No visual changes. ENT: No trauma Cardiovascular: Family unaware of any chest pain. Respiratory: Family denies any shortness of breath. Gastrointestinal: No abdominal pain.  No nausea, no vomiting.  No diarrhea.  No constipation. Musculoskeletal: Negative for back pain. Skin: Positive for skin tear. Neurological: Negative for headaches, focal weakness or numbness.  10-point ROS otherwise negative.  ____________________________________________   PHYSICAL EXAM:  VITAL SIGNS: ED Triage Vitals  Enc Vitals Group     BP 06/10/15 1022 116/85 mmHg     Pulse Rate 06/10/15 1022 78     Resp 06/10/15 1022 20      Temp 06/10/15 1022 97.8 F (36.6 C)     Temp Source 06/10/15 1022 Oral     SpO2 06/10/15 1022 96 %     Weight 06/10/15 1022 150 lb (68.04 kg)     Height 06/10/15 1022 5' 7"  (1.702 m)     Head Cir --      Peak Flow --      Pain Score --      Pain Loc --      Pain Edu? --      Excl. in [place]? --     Constitutional: Well appearing and in no acute distress. Patient is verbal but limited. Eyes: Conjunctivae are normal. PERRL. EOMI. Head: Atraumatic. Nose: No evidence of trauma. Neck: No stridor.  Nontender to palpation cervical spine posteriorly. Patient is moving neck without any signs of irritation or pain. Cardiovascular: Normal rate, regular rhythm. Grossly normal heart sounds.  Good peripheral circulation. Respiratory: Normal respiratory effort.  No retractions. Lungs CTAB. Gastrointestinal: Soft and nontender. No distention. Bowel sounds normoactive 4 quadrants. Musculoskeletal: On examination of the right hand there is some minimal soft tissue swelling. There does not appear to be any tenderness on palpation of the digits, metacarpal or carpal bones. Patient moves hand without any limitations. No gross deformity was noted. Neurologic:  Normal speech and language. No gross focal neurologic deficits are appreciated. No gait instability. Skin:  Skin is warm, dry area and there is a skin tear on the dorsum of the right hand. No signs of infection is seen and no foreign bodies were noted. There is no active bleeding. Psychiatric: Mood and affect are normal. Speech and behavior are normal.  ____________________________________________   LABS (all labs ordered are listed, but only abnormal results are displayed)  Labs Reviewed  COMPREHENSIVE METABOLIC PANEL - Abnormal; Notable for the following:    Sodium 134 (*)    Chloride 100 (*)    Glucose, Bld 126 (*)    Calcium 11.5 (*)    All other components within normal limits  CBC WITH DIFFERENTIAL/PLATELET - Abnormal; Notable for the  following:    Monocytes Absolute 1.0 (*)    All other components within normal limits   ____________________________________________  EKG  Per Dr. Edd Fabian ____________________________________________  RADIOLOGY  Right hand x-ray per radiologist shows no acute bony injury. I, Johnn Hai, personally viewed and evaluated these images (plain radiographs) as part of my medical decision making, as well as reviewing the written report by the radiologist. ____________________________________________  PROCEDURES  Procedure(s) performed: None  Critical Care performed: No  ____________________________________________   INITIAL IMPRESSION / ASSESSMENT AND PLAN / ED COURSE  Pertinent labs & imaging results that were available during my care of the patient were reviewed by me and considered in my medical decision making (see chart for details).  Restore was applied to the patient's right hand. Family is aware that this is to stay on for 7 days. She is to follow-up with her primary care doctor if any continued problems. They were reassured that EKG and lab work did not show any severe abnormalities. ____________________________________________   FINAL CLINICAL IMPRESSION(S) / ED DIAGNOSES  Final diagnoses:  Skin tear of hand without complication, right, initial encounter      NEW MEDICATIONS STARTED DURING THIS VISIT:  Discharge Medication List as of 06/10/2015 12:30 PM       Note:  This document was prepared using Dragon voice recognition software and may include unintentional dictation errors.    Johnn Hai, PA-C 06/10/15 Sandy Level, MD 06/13/15 2039

## 2015-06-10 NOTE — ED Notes (Addendum)
Skin tear to right hand. Pt fall with caregiver yesterday PT unable to state if she was dizzy and fell or tripped.

## 2015-06-10 NOTE — ED Notes (Signed)
Dressing placed on right hand  Caretakers in with pt

## 2015-06-10 NOTE — ED Notes (Signed)
Pt to ed with caregiver who reports when she arrived this am she noticed swelling and skin tear to right hand/wrist area.  Per current caregiver, over the weekend another caregiver told her she was about to fall and she grabbed her hand to keep from falling.  Pt has alzheimers and is unable to report events over the weekend.

## 2015-06-10 NOTE — Discharge Instructions (Signed)
Leave Restore dressing on for 7 days. Follow-up with your doctor if any continued problems. Watch for signs of infection such as redness, fever, pus.

## 2015-06-10 NOTE — ED Provider Notes (Signed)
ED ECG REPORT I, Gayla DossGayle, Morio Widen A, the attending physician, personally viewed and interpreted this ECG.   Date: 06/10/2015  EKG Time: 11:21  Rate: 73  Rhythm: normal EKG, normal sinus rhythm  Axis: normal  Intervals:none  ST&T Change: No acute ST elevation.   Gayla DossEryka A Ilene Witcher, MD 06/10/15 [phone]

## 2015-08-29 ENCOUNTER — Emergency Department
Admission: EM | Admit: 2015-08-29 | Discharge: 2015-08-29 | Disposition: A | Discharge status: Home / Self Care | Payer: Medicare Other | Attending: Emergency Medicine | Admitting: Emergency Medicine

## 2015-08-29 ENCOUNTER — Encounter: Payer: Self-pay | Admitting: Emergency Medicine

## 2015-08-29 ENCOUNTER — Emergency Department: Payer: Medicare Other

## 2015-08-29 DIAGNOSIS — Y9289 Other specified places as the place of occurrence of the external cause: Secondary | ICD-10-CM | POA: Diagnosis not present

## 2015-08-29 DIAGNOSIS — Y999 Unspecified external cause status: Secondary | ICD-10-CM | POA: Diagnosis not present

## 2015-08-29 DIAGNOSIS — Z7982 Long term (current) use of aspirin: Secondary | ICD-10-CM | POA: Diagnosis not present

## 2015-08-29 DIAGNOSIS — G309 Alzheimer's disease, unspecified: Secondary | ICD-10-CM | POA: Diagnosis not present

## 2015-08-29 DIAGNOSIS — Y9301 Activity, walking, marching and hiking: Secondary | ICD-10-CM | POA: Diagnosis not present

## 2015-08-29 DIAGNOSIS — Z7984 Long term (current) use of oral hypoglycemic drugs: Secondary | ICD-10-CM | POA: Insufficient documentation

## 2015-08-29 DIAGNOSIS — R2242 Localized swelling, mass and lump, left lower limb: Secondary | ICD-10-CM | POA: Insufficient documentation

## 2015-08-29 DIAGNOSIS — E119 Type 2 diabetes mellitus without complications: Secondary | ICD-10-CM | POA: Diagnosis not present

## 2015-08-29 DIAGNOSIS — N390 Urinary tract infection, site not specified: Secondary | ICD-10-CM | POA: Insufficient documentation

## 2015-08-29 DIAGNOSIS — S0990XA Unspecified injury of head, initial encounter: Secondary | ICD-10-CM | POA: Diagnosis present

## 2015-08-29 DIAGNOSIS — W19XXXA Unspecified fall, initial encounter: Secondary | ICD-10-CM | POA: Diagnosis not present

## 2015-08-29 DIAGNOSIS — Z794 Long term (current) use of insulin: Secondary | ICD-10-CM | POA: Insufficient documentation

## 2015-08-29 DIAGNOSIS — I10 Essential (primary) hypertension: Secondary | ICD-10-CM | POA: Insufficient documentation

## 2015-08-29 HISTORY — DX: Pure hypercholesterolemia, unspecified: E78.00

## 2015-08-29 HISTORY — DX: Thyrotoxicosis, unspecified without thyrotoxic crisis or storm: E05.90

## 2015-08-29 LAB — BASIC METABOLIC PANEL
Anion gap: 14 (ref 5–15)
BUN: 16 mg/dL (ref 6–20)
CHLORIDE: 90 mmol/L — AB (ref 101–111)
CO2: 26 mmol/L (ref 22–32)
Calcium: 9.8 mg/dL (ref 8.9–10.3)
Creatinine, Ser: 1.24 mg/dL — ABNORMAL HIGH (ref 0.44–1.00)
GFR calc non Af Amer: 38 mL/min — ABNORMAL LOW (ref >60)
GFR, EST AFRICAN AMERICAN: 44 mL/min — AB (ref >60)
Glucose, Bld: 170 mg/dL — ABNORMAL HIGH (ref 65–99)
POTASSIUM: 2.9 mmol/L — AB (ref 3.5–5.1)
SODIUM: 130 mmol/L — AB (ref 135–145)

## 2015-08-29 LAB — URINALYSIS COMPLETE WITH MICROSCOPIC (ARMC ONLY)
Bacteria, UA: NONE SEEN
Bilirubin Urine: NEGATIVE
Glucose, UA: NEGATIVE mg/dL
Ketones, ur: NEGATIVE mg/dL
NITRITE: NEGATIVE
PROTEIN: 30 mg/dL — AB
SPECIFIC GRAVITY, URINE: 1.014 (ref 1.005–1.030)
pH: 5 (ref 5.0–8.0)

## 2015-08-29 LAB — CBC
HCT: 39.3 % (ref 35.0–47.0)
Hemoglobin: 13.3 g/dL (ref 12.0–16.0)
MCH: 30.2 pg (ref 26.0–34.0)
MCHC: 33.7 g/dL (ref 32.0–36.0)
MCV: 89.6 fL (ref 80.0–100.0)
Platelets: 273 10*3/uL (ref 150–440)
RBC: 4.39 MIL/uL (ref 3.80–5.20)
RDW: 13.2 % (ref 11.5–14.5)
WBC: 17.8 10*3/uL — ABNORMAL HIGH (ref 3.6–11.0)

## 2015-08-29 LAB — TSH: TSH: 4.688 u[IU]/mL — AB (ref 0.350–4.500)

## 2015-08-29 LAB — T4, FREE: Free T4: 0.9 ng/dL (ref 0.61–1.12)

## 2015-08-29 MED ORDER — POTASSIUM CHLORIDE CRYS ER 20 MEQ PO TBCR
40.0000 meq | EXTENDED_RELEASE_TABLET | Freq: Once | ORAL | Status: AC
  Administered 2015-08-29: 40 meq via ORAL
  Filled 2015-08-29: qty 2

## 2015-08-29 MED ORDER — CEPHALEXIN 500 MG PO CAPS
500.0000 mg | ORAL_CAPSULE | Freq: Once | ORAL | Status: AC
  Administered 2015-08-29: 500 mg via ORAL
  Filled 2015-08-29: qty 1

## 2015-08-29 MED ORDER — CEPHALEXIN 500 MG PO CAPS: 500.0000 mg | ORAL_CAPSULE | Freq: Two times a day (BID) | ORAL | 0 refills | Status: DC

## 2015-08-29 NOTE — ED Provider Notes (Signed)
[facility] Emergency Department Provider Note  ____________________________________________  Time seen: Approximately 10:22 PM  I have reviewed the triage vital signs and the nursing notes.   HISTORY  Chief Complaint Fall  Level 5 caveat:  Portions of the history and physical were unable to be obtained due to the patient's chronic dementia Additional history obtained from family and health aide at bedside  HPI Jenna Robinson is a 82 y.o. female brought to the ED because of a fall. She was walking with her walker and lost her balance due to avoiding putting weight on her left ankle. This is a typical problem for her and recurrent. Chronic in nature. 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 Caregivers report that she has a swelling of the back of the head. No loss of consciousness or vomiting. No numbness tingling or weakness or vision changes.     Past Medical History:  Diagnosis Date  . Alzheimer disease   . Diabetes mellitus without complication ([place])   . Hypercholesteremia   . Hypertension   . Hyperthyroidism      Patient Active Problem List   Diagnosis Date Noted  . Hypercalcemia 05/22/2015  . Elevated troponin 01/02/2015  . Rhabdomyolysis 01/02/2015     Past Surgical History:  Procedure Laterality Date  . ABDOMINAL HYSTERECTOMY    . DILATION AND CURETTAGE, DIAGNOSTIC / THERAPEUTIC       Prior to Admission medications   Medication Sig Start Date End Date Taking? Authorizing Provider  acetaminophen (TYLENOL) 325 MG tablet Take 650 mg by mouth every 6 (six) hours as needed for mild pain, moderate pain, fever or headache.    Historical Provider, MD  aspirin EC 81 MG tablet Take 81 mg by mouth daily.    Historical Provider, MD  atenolol (TENORMIN) 25 MG tablet Take 75 mg by mouth daily.     Historical Provider, MD  atorvastatin (LIPITOR)  20 MG tablet Take 1 tablet (20 mg total) by mouth daily. Patient not taking: Reported on 05/22/2015 01/05/15   Gladstone Lighter, MD  cephALEXin (KEFLEX) 500 MG capsule Take 1 capsule (500 mg total) by mouth 2 (two) times daily. 08/29/15   Carrie Mew, MD  insulin detemir (LEVEMIR) 100 unit/ml SOLN Inject 0.16 mLs (16 Units total) into the skin daily. Patient taking differently: Inject 30 Units into the skin at bedtime.  01/04/15   Gladstone Lighter, MD  lisinopril (PRINIVIL,ZESTRIL) 10 MG tablet Take 1 tablet by mouth daily.    Historical Provider, MD  metFORMIN (GLUMETZA) 500 MG (MOD) 24 hr tablet Take 1,000 mg by mouth 2 (two) times daily.     Historical Provider, MD  Nicotine 21-14-7 MG/24HR KIT Place 21 mg onto the skin daily. Patient not taking: Reported on 05/22/2015 01/05/15   Gladstone Lighter, MD  traZODone (DESYREL) 50 MG tablet Take 25 mg by mouth at bedtime.    Historical Provider, MD  Vitamin D, Ergocalciferol, (DRISDOL) 50000 units CAPS capsule Take 50,000 Units by mouth every 7 (seven) days.    Historical Provider, MD     Allergies Review of patient's allergies indicates no known allergies.   Family History  Problem Relation Age of Onset  . Cancer      multiple family members including all siblings    Social History Social History  Substance Use Topics  . Smoking status: Never Smoker  . Smokeless tobacco: Never Used  .  Alcohol use No    Review of Systems  Constitutional:   No fever   ENT: . No rhinorrhea. Cardiovascular:   No chest pain. Respiratory:   No dyspnea or cough. Gastrointestinal:   Negative for abdominal pain, vomiting and diarrhea.  Genitourinary:   Negative for dysuria or difficulty urinating.no foul urine Musculoskeletal:   Chronic left ankle swelling Neurological:   Negative for headaches 10-point ROS otherwise negative.  ____________________________________________   PHYSICAL EXAM:  VITAL SIGNS: ED Triage Vitals [08/29/15 1938]  Enc  Vitals Group     BP (!) 105/53     Pulse Rate 89     Resp 18     Temp 97.9 F (36.6 C)     Temp Source Oral     SpO2 96 %     Weight 135 lb (61.2 kg)     Height 5' 11" (1.803 m)     Head Circumference      Peak Flow      Pain Score      Pain Loc      Pain Edu?      Excl. in [place]?     Vital signs reviewed, nursing assessments reviewed.   Constitutional:   Alert and orientedo person and place. Well appearing and in no distress. Eyes:   No scleral icterus. No conjunctival pallor. PERRL. EOMI.  No nystagmus. ENT   Head:   Normocephalic with 2 cm soft scalp hematoma on the left occipital scalp. No bony tenderness step-offs or crepitus.   Nose:   No congestion/rhinnorhea. No septal hematoma   Mouth/Throat:   MMM, no pharyngeal erythema. No peritonsillar mass. Diffuse dental decay. No jaw tenderness   Neck:   No stridor. No SubQ emphysema. No meningismus. Hematological/Lymphatic/Immunilogical:   No cervical lymphadenopathy. Cardiovascular:   RRR. Symmetric bilateral radial and DP pulses.  No murmurs.  Respiratory:   Normal respiratory effort without tachypnea nor retractions. Breath sounds are clear and equal bilaterally. No wheezes/rales/rhonchi. Gastrointestinal:   Soft and nontender. Non distended. There is no CVA tenderness.  No rebound, rigidity, or guarding. Genitourinary:   deferred Musculoskeletal:   Nontender with normal range of motion in all extremities. No joint effusions.  No lower extremity tenderness.  No edema.left ankle unremarkable Neurologic:   Normal speech and language.  CN 2-10 normal. Motor grossly intact. No gross focal neurologic deficits are appreciated.  Skin:    Skin is warm, dry and intact. No rash noted.  No petechiae, purpura, or bullae.  ____________________________________________    LABS (pertinent positives/negatives) (all labs ordered are listed, but only abnormal results are displayed) Labs Reviewed  CBC - Abnormal; Notable for  the following:       Result Value   WBC 17.8 (*)    All other components within normal limits  BASIC METABOLIC PANEL - Abnormal; Notable for the following:    Sodium 130 (*)    Potassium 2.9 (*)    Chloride 90 (*)    Glucose, Bld 170 (*)    Creatinine, Ser 1.24 (*)    GFR calc non Af Amer 38 (*)    GFR calc Af Amer 44 (*)    All other components within normal limits  URINALYSIS COMPLETEWITH MICROSCOPIC ([facility] ONLY) - Abnormal; Notable for the following:    Color, Urine YELLOW (*)    APPearance CLEAR (*)    Hgb urine dipstick 1+ (*)    Protein, ur 30 (*)    Leukocytes, UA 1+ (*)  Squamous Epithelial / LPF 0-5 (*)    All other components within normal limits  TSH - Abnormal; Notable for the following:    TSH 4.688 (*)    All other components within normal limits  URINE CULTURE  T4, FREE   ____________________________________________   EKG    ____________________________________________    RADIOLOGY  CT head unremarkable CT cervical spine unremarkable xr left ankle unremarkable.  ____________________________________________   PROCEDURES Procedures  ____________________________________________   INITIAL IMPRESSION / ASSESSMENT AND PLAN / ED COURSE  Pertinent labs & imaging results that were available during my care of the patient were reviewed by me and considered in my medical decision making (see chart for details).  Patient well appearing no acute distress. Presents with left posterior scalp hematoma after a mechanical fall.Imaging unremarkable Labs unremarkable, consistent with her underlying hypothyroidism, with free T4 within normal limits.Marland Kitchendoes have a slightly elevated white blood cell count of . Urine dipstick suggestive of UTI, will follow up micro results.    ----------------------------------------- 10:58 PM on 08/29/2015 -----------------------------------------  UA consistent with urinary tract infection. Patient calm comfortable and stable.  Replete potassium, oral Keflex with prescription, follow-up with primary care. No evidence of sepsis or pyelonephritis.   Clinical Course   ____________________________________________   FINAL CLINICAL IMPRESSION(S) / ED DIAGNOSES  Final diagnoses:  Fall, initial encounter  Head injury, initial encounter  UTI (lower urinary tract infection)       Portions of this note were generated with dragon dictation software. Dictation errors may occur despite best attempts at proofreading.    Carrie Mew, MD 08/29/15 2259

## 2015-08-29 NOTE — ED Triage Notes (Addendum)
Pt presents to ED via wheelchair from home, caregiver reports pt had an unwitnessed fall and hit the back of head, hematoma noted to occipital, denies LOC. Caregiver reports patient ambulates with walker, and did not want to place weight on LEFT foot, and c/o LEFT wrist. Patient is not on blood thinner. Pt niece reports Dr. Tedd SiasSolum wants patient to have thyroid levels checked. Pt has hx of dementia, caregiver reports patient is at baseline. Swelling noted to left ankle.

## 2015-08-29 NOTE — ED Notes (Signed)
Patient transported to CT via wheelchair

## 2015-08-29 NOTE — ED Notes (Signed)
Pt grunting. Per care giver and niece that is pt's normal.

## 2015-08-29 NOTE — ED Notes (Signed)

## 2015-08-29 NOTE — ED Notes (Signed)
Pt fell at care home this am, family states hematoma to head. Also wants thyroid levels checked.

## 2015-08-31 LAB — URINE CULTURE

## 2019-04-08 ENCOUNTER — Inpatient Hospital Stay: Payer: Medicare Other | Admitting: Anesthesiology

## 2019-04-08 ENCOUNTER — Emergency Department: Payer: Medicare Other

## 2019-04-08 ENCOUNTER — Inpatient Hospital Stay: Payer: Medicare Other

## 2019-04-08 ENCOUNTER — Inpatient Hospital Stay
Admission: EM | Admit: 2019-04-08 | Discharge: 2019-04-11 | DRG: 481 | Disposition: A | Discharge status: Skilled Nursing Facility | Payer: Medicare Other | Source: Skilled Nursing Facility | Attending: Internal Medicine | Admitting: Internal Medicine

## 2019-04-08 ENCOUNTER — Encounter
Admission: EM | Disposition: A | Discharge status: Skilled Nursing Facility | Payer: Self-pay | Source: Skilled Nursing Facility | Attending: Internal Medicine

## 2019-04-08 ENCOUNTER — Encounter: Payer: Self-pay | Admitting: Family Medicine

## 2019-04-08 ENCOUNTER — Other Ambulatory Visit: Payer: Self-pay

## 2019-04-08 DIAGNOSIS — D62 Acute posthemorrhagic anemia: Secondary | ICD-10-CM | POA: Diagnosis not present

## 2019-04-08 DIAGNOSIS — M1611 Unilateral primary osteoarthritis, right hip: Secondary | ICD-10-CM | POA: Diagnosis present

## 2019-04-08 DIAGNOSIS — R079 Chest pain, unspecified: Secondary | ICD-10-CM

## 2019-04-08 DIAGNOSIS — W010XXA Fall on same level from slipping, tripping and stumbling without subsequent striking against object, initial encounter: Secondary | ICD-10-CM | POA: Diagnosis present

## 2019-04-08 DIAGNOSIS — S72091A Other fracture of head and neck of right femur, initial encounter for closed fracture: Secondary | ICD-10-CM | POA: Diagnosis present

## 2019-04-08 DIAGNOSIS — E119 Type 2 diabetes mellitus without complications: Secondary | ICD-10-CM | POA: Diagnosis present

## 2019-04-08 DIAGNOSIS — S72001A Fracture of unspecified part of neck of right femur, initial encounter for closed fracture: Secondary | ICD-10-CM | POA: Diagnosis present

## 2019-04-08 DIAGNOSIS — F0280 Dementia in other diseases classified elsewhere without behavioral disturbance: Secondary | ICD-10-CM | POA: Diagnosis present

## 2019-04-08 DIAGNOSIS — Z7983 Long term (current) use of bisphosphonates: Secondary | ICD-10-CM | POA: Diagnosis not present

## 2019-04-08 DIAGNOSIS — G309 Alzheimer's disease, unspecified: Secondary | ICD-10-CM | POA: Diagnosis present

## 2019-04-08 DIAGNOSIS — S72111A Displaced fracture of greater trochanter of right femur, initial encounter for closed fracture: Secondary | ICD-10-CM | POA: Diagnosis present

## 2019-04-08 DIAGNOSIS — W19XXXA Unspecified fall, initial encounter: Secondary | ICD-10-CM

## 2019-04-08 DIAGNOSIS — E0590 Thyrotoxicosis, unspecified without thyrotoxic crisis or storm: Secondary | ICD-10-CM | POA: Diagnosis present

## 2019-04-08 DIAGNOSIS — Z794 Long term (current) use of insulin: Secondary | ICD-10-CM

## 2019-04-08 DIAGNOSIS — S0003XA Contusion of scalp, initial encounter: Secondary | ICD-10-CM | POA: Diagnosis present

## 2019-04-08 DIAGNOSIS — E785 Hyperlipidemia, unspecified: Secondary | ICD-10-CM | POA: Diagnosis present

## 2019-04-08 DIAGNOSIS — S41111A Laceration without foreign body of right upper arm, initial encounter: Secondary | ICD-10-CM | POA: Diagnosis present

## 2019-04-08 DIAGNOSIS — K5900 Constipation, unspecified: Secondary | ICD-10-CM

## 2019-04-08 DIAGNOSIS — Z79899 Other long term (current) drug therapy: Secondary | ICD-10-CM | POA: Diagnosis not present

## 2019-04-08 DIAGNOSIS — F0390 Unspecified dementia without behavioral disturbance: Secondary | ICD-10-CM

## 2019-04-08 DIAGNOSIS — Z7982 Long term (current) use of aspirin: Secondary | ICD-10-CM

## 2019-04-08 DIAGNOSIS — Y92129 Unspecified place in nursing home as the place of occurrence of the external cause: Secondary | ICD-10-CM

## 2019-04-08 DIAGNOSIS — S72144A Nondisplaced intertrochanteric fracture of right femur, initial encounter for closed fracture: Secondary | ICD-10-CM | POA: Diagnosis present

## 2019-04-08 DIAGNOSIS — R296 Repeated falls: Secondary | ICD-10-CM | POA: Diagnosis present

## 2019-04-08 DIAGNOSIS — N838 Other noninflammatory disorders of ovary, fallopian tube and broad ligament: Secondary | ICD-10-CM

## 2019-04-08 DIAGNOSIS — I10 Essential (primary) hypertension: Secondary | ICD-10-CM | POA: Diagnosis present

## 2019-04-08 DIAGNOSIS — Z20822 Contact with and (suspected) exposure to covid-19: Secondary | ICD-10-CM | POA: Diagnosis present

## 2019-04-08 DIAGNOSIS — Z87440 Personal history of urinary (tract) infections: Secondary | ICD-10-CM

## 2019-04-08 DIAGNOSIS — Z419 Encounter for procedure for purposes other than remedying health state, unspecified: Secondary | ICD-10-CM

## 2019-04-08 DIAGNOSIS — S72001D Fracture of unspecified part of neck of right femur, subsequent encounter for closed fracture with routine healing: Secondary | ICD-10-CM | POA: Diagnosis not present

## 2019-04-08 DIAGNOSIS — E118 Type 2 diabetes mellitus with unspecified complications: Secondary | ICD-10-CM | POA: Diagnosis not present

## 2019-04-08 HISTORY — PX: INTRAMEDULLARY (IM) NAIL INTERTROCHANTERIC: SHX5875

## 2019-04-08 LAB — CBC WITH DIFFERENTIAL/PLATELET
Abs Immature Granulocytes: 0.03 10*3/uL (ref 0.00–0.07)
Basophils Absolute: 0 10*3/uL (ref 0.0–0.1)
Basophils Relative: 0 %
Eosinophils Absolute: 0.5 10*3/uL (ref 0.0–0.5)
Eosinophils Relative: 5 %
HCT: 35 % — ABNORMAL LOW (ref 36.0–46.0)
Hemoglobin: 10.8 g/dL — ABNORMAL LOW (ref 12.0–15.0)
Immature Granulocytes: 0 %
Lymphocytes Relative: 16 %
Lymphs Abs: 1.6 10*3/uL (ref 0.7–4.0)
MCH: 30.2 pg (ref 26.0–34.0)
MCHC: 30.9 g/dL (ref 30.0–36.0)
MCV: 97.8 fL (ref 80.0–100.0)
Monocytes Absolute: 0.8 10*3/uL (ref 0.1–1.0)
Monocytes Relative: 8 %
Neutro Abs: 7.1 10*3/uL (ref 1.7–7.7)
Neutrophils Relative %: 71 %
Platelets: 322 10*3/uL (ref 150–400)
RBC: 3.58 MIL/uL — ABNORMAL LOW (ref 3.87–5.11)
RDW: 14.3 % (ref 11.5–15.5)
WBC: 10 10*3/uL (ref 4.0–10.5)
nRBC: 0 % (ref 0.0–0.2)

## 2019-04-08 LAB — RESPIRATORY PANEL BY RT PCR (FLU A&B, COVID)
Influenza A by PCR: NEGATIVE
Influenza B by PCR: NEGATIVE
SARS Coronavirus 2 by RT PCR: NEGATIVE

## 2019-04-08 LAB — URINALYSIS, ROUTINE W REFLEX MICROSCOPIC
Bilirubin Urine: NEGATIVE
Glucose, UA: NEGATIVE mg/dL
Ketones, ur: NEGATIVE mg/dL
Leukocytes,Ua: NEGATIVE
Nitrite: NEGATIVE
Protein, ur: 30 mg/dL — AB
RBC / HPF: >50 RBC/hpf — ABNORMAL HIGH (ref 0–5)
Specific Gravity, Urine: 1.017 (ref 1.005–1.030)
pH: 6 (ref 5.0–8.0)

## 2019-04-08 LAB — SURGICAL PCR SCREEN
MRSA, PCR: NEGATIVE
Staphylococcus aureus: POSITIVE — AB

## 2019-04-08 LAB — COMPREHENSIVE METABOLIC PANEL
ALT: 18 U/L (ref 0–44)
AST: 25 U/L (ref 15–41)
Albumin: 3.8 g/dL (ref 3.5–5.0)
Alkaline Phosphatase: 201 U/L — ABNORMAL HIGH (ref 38–126)
Anion gap: 11 (ref 5–15)
BUN: 22 mg/dL (ref 8–23)
CO2: 26 mmol/L (ref 22–32)
Calcium: 9.7 mg/dL (ref 8.9–10.3)
Chloride: 99 mmol/L (ref 98–111)
Creatinine, Ser: 0.76 mg/dL (ref 0.44–1.00)
GFR calc Af Amer: >60 mL/min (ref >60)
GFR calc non Af Amer: >60 mL/min (ref >60)
Glucose, Bld: 75 mg/dL (ref 70–99)
Potassium: 4.2 mmol/L (ref 3.5–5.1)
Sodium: 136 mmol/L (ref 135–145)
Total Bilirubin: 0.6 mg/dL (ref 0.3–1.2)
Total Protein: 6.9 g/dL (ref 6.5–8.1)

## 2019-04-08 LAB — APTT: aPTT: 39 seconds — ABNORMAL HIGH (ref 24–36)

## 2019-04-08 LAB — TYPE AND SCREEN
ABO/RH(D): A POS
Antibody Screen: NEGATIVE

## 2019-04-08 LAB — GLUCOSE, CAPILLARY
Glucose-Capillary: 105 mg/dL — ABNORMAL HIGH (ref 70–99)
Glucose-Capillary: 70 mg/dL (ref 70–99)

## 2019-04-08 LAB — PROTIME-INR
INR: 1 (ref 0.8–1.2)
Prothrombin Time: 12.7 seconds (ref 11.4–15.2)

## 2019-04-08 LAB — TSH: TSH: 1.826 u[IU]/mL (ref 0.350–4.500)

## 2019-04-08 SURGERY — FIXATION, FRACTURE, INTERTROCHANTERIC, WITH INTRAMEDULLARY ROD
Anesthesia: Spinal | Site: Hip | Laterality: Right

## 2019-04-08 MED ORDER — CEFAZOLIN SODIUM-DEXTROSE 2-4 GM/100ML-% IV SOLN
2.0000 g | Freq: Four times a day (QID) | INTRAVENOUS | Status: AC
  Administered 2019-04-08 – 2019-04-09 (×3): 2 g via INTRAVENOUS
  Filled 2019-04-08 (×3): qty 100

## 2019-04-08 MED ORDER — KETOROLAC TROMETHAMINE 30 MG/ML IJ SOLN
INTRAMUSCULAR | Status: AC
  Filled 2019-04-08: qty 1

## 2019-04-08 MED ORDER — TRAZODONE HCL 50 MG PO TABS: 25.0000 mg | ORAL_TABLET | Freq: Every day | ORAL | Status: DC

## 2019-04-08 MED ORDER — KETAMINE HCL 50 MG/ML IJ SOLN
INTRAMUSCULAR | Status: AC
  Filled 2019-04-08: qty 10

## 2019-04-08 MED ORDER — BUPIVACAINE LIPOSOME 1.3 % IJ SUSP
INTRAMUSCULAR | Status: DC | PRN
  Administered 2019-04-08: 50 mL

## 2019-04-08 MED ORDER — PROPOFOL 500 MG/50ML IV EMUL
INTRAVENOUS | Status: DC | PRN
  Administered 2019-04-08: 55 ug/kg/min via INTRAVENOUS

## 2019-04-08 MED ORDER — FENTANYL CITRATE (PF) 100 MCG/2ML IJ SOLN: 25.0000 ug | INTRAMUSCULAR | Status: DC | PRN

## 2019-04-08 MED ORDER — PROPOFOL 500 MG/50ML IV EMUL
INTRAVENOUS | Status: AC
  Filled 2019-04-08: qty 50

## 2019-04-08 MED ORDER — ONDANSETRON HCL 4 MG/2ML IJ SOLN: 4.0000 mg | Freq: Four times a day (QID) | INTRAMUSCULAR | Status: DC | PRN

## 2019-04-08 MED ORDER — DIPHENHYDRAMINE HCL 25 MG PO CAPS: 50.0000 mg | ORAL_CAPSULE | Freq: Every day | ORAL | Status: DC

## 2019-04-08 MED ORDER — MELATONIN 10 MG PO CAPS: 1.0000 | ORAL_CAPSULE | Freq: Every day | ORAL | Status: DC

## 2019-04-08 MED ORDER — TRAMADOL HCL 50 MG PO TABS
50.0000 mg | ORAL_TABLET | Freq: Four times a day (QID) | ORAL | Status: DC | PRN
  Administered 2019-04-09 – 2019-04-10 (×4): 50 mg via ORAL
  Filled 2019-04-08 (×4): qty 1

## 2019-04-08 MED ORDER — CEFAZOLIN SODIUM-DEXTROSE 2-4 GM/100ML-% IV SOLN
2.0000 g | Freq: Once | INTRAVENOUS | Status: AC
  Administered 2019-04-08: 2 g via INTRAVENOUS
  Filled 2019-04-08: qty 100

## 2019-04-08 MED ORDER — FENTANYL CITRATE (PF) 100 MCG/2ML IJ SOLN
INTRAMUSCULAR | Status: AC
  Filled 2019-04-08: qty 2

## 2019-04-08 MED ORDER — VITAMIN D 25 MCG (1000 UNIT) PO TABS
2000.0000 [IU] | ORAL_TABLET | Freq: Every day | ORAL | Status: DC
  Administered 2019-04-09 – 2019-04-11 (×3): 2000 [IU] via ORAL
  Filled 2019-04-08 (×3): qty 2

## 2019-04-08 MED ORDER — OXYBUTYNIN CHLORIDE 5 MG PO TABS
5.0000 mg | ORAL_TABLET | Freq: Two times a day (BID) | ORAL | Status: DC
  Administered 2019-04-08 – 2019-04-11 (×6): 5 mg via ORAL
  Filled 2019-04-08 (×8): qty 1

## 2019-04-08 MED ORDER — METOCLOPRAMIDE HCL 10 MG PO TABS: 5.0000 mg | ORAL_TABLET | Freq: Three times a day (TID) | ORAL | Status: DC | PRN

## 2019-04-08 MED ORDER — METHOCARBAMOL 500 MG PO TABS: 500.0000 mg | ORAL_TABLET | Freq: Four times a day (QID) | ORAL | Status: DC | PRN

## 2019-04-08 MED ORDER — METHOCARBAMOL 1000 MG/10ML IJ SOLN
500.0000 mg | Freq: Four times a day (QID) | INTRAVENOUS | Status: DC | PRN
  Filled 2019-04-08: qty 5

## 2019-04-08 MED ORDER — ATENOLOL 25 MG PO TABS: 75.0000 mg | ORAL_TABLET | Freq: Every day | ORAL | Status: DC

## 2019-04-08 MED ORDER — ENOXAPARIN SODIUM 40 MG/0.4ML ~~LOC~~ SOLN
40.0000 mg | SUBCUTANEOUS | Status: DC
  Administered 2019-04-09 – 2019-04-11 (×3): 40 mg via SUBCUTANEOUS
  Filled 2019-04-08 (×3): qty 0.4

## 2019-04-08 MED ORDER — BISACODYL 10 MG RE SUPP
10.0000 mg | Freq: Every day | RECTAL | Status: DC | PRN
  Filled 2019-04-08: qty 1

## 2019-04-08 MED ORDER — ONDANSETRON HCL 4 MG PO TABS: 4.0000 mg | ORAL_TABLET | Freq: Four times a day (QID) | ORAL | Status: DC | PRN

## 2019-04-08 MED ORDER — SODIUM CHLORIDE 0.9 % IR SOLN
Status: DC | PRN
  Administered 2019-04-08: 12:00:00 500 mL

## 2019-04-08 MED ORDER — FENTANYL CITRATE (PF) 100 MCG/2ML IJ SOLN
INTRAMUSCULAR | Status: DC | PRN
  Administered 2019-04-08 (×2): 25 ug via INTRAVENOUS

## 2019-04-08 MED ORDER — OXYCODONE HCL 5 MG/5ML PO SOLN: 5.0000 mg | Freq: Once | ORAL | Status: DC | PRN

## 2019-04-08 MED ORDER — SODIUM CHLORIDE 0.9 % IV SOLN: INTRAVENOUS | Status: DC

## 2019-04-08 MED ORDER — SENNOSIDES-DOCUSATE SODIUM 8.6-50 MG PO TABS: 1.0000 | ORAL_TABLET | Freq: Every evening | ORAL | Status: DC | PRN

## 2019-04-08 MED ORDER — DOCUSATE SODIUM 100 MG PO CAPS
100.0000 mg | ORAL_CAPSULE | Freq: Two times a day (BID) | ORAL | Status: DC
  Administered 2019-04-08 – 2019-04-10 (×4): 100 mg via ORAL
  Filled 2019-04-08 (×4): qty 1

## 2019-04-08 MED ORDER — OXYCODONE HCL 5 MG PO TABS: 5.0000 mg | ORAL_TABLET | ORAL | Status: DC | PRN

## 2019-04-08 MED ORDER — ACETAMINOPHEN 500 MG PO TABS
1000.0000 mg | ORAL_TABLET | Freq: Three times a day (TID) | ORAL | Status: AC
  Administered 2019-04-08 – 2019-04-09 (×3): 1000 mg via ORAL
  Filled 2019-04-08 (×3): qty 2

## 2019-04-08 MED ORDER — CINACALCET HCL 30 MG PO TABS
30.0000 mg | ORAL_TABLET | ORAL | Status: DC
  Administered 2019-04-10: 30 mg via ORAL
  Filled 2019-04-08 (×2): qty 1

## 2019-04-08 MED ORDER — BUPIVACAINE HCL (PF) 0.5 % IJ SOLN
INTRAMUSCULAR | Status: DC | PRN
  Administered 2019-04-08: 3 mL

## 2019-04-08 MED ORDER — MAGNESIUM OXIDE 400 (241.3 MG) MG PO TABS: 400.0000 mg | ORAL_TABLET | Freq: Every day | ORAL | Status: DC

## 2019-04-08 MED ORDER — BUPIVACAINE HCL (PF) 0.5 % IJ SOLN
INTRAMUSCULAR | Status: AC
  Filled 2019-04-08: qty 10

## 2019-04-08 MED ORDER — DEXTROSE 50 % IV SOLN
25.0000 mL | Freq: Once | INTRAVENOUS | Status: AC
  Administered 2019-04-08: 25 mL via INTRAVENOUS

## 2019-04-08 MED ORDER — ATORVASTATIN CALCIUM 20 MG PO TABS
20.0000 mg | ORAL_TABLET | Freq: Every day | ORAL | Status: DC
  Administered 2019-04-08 – 2019-04-10 (×3): 20 mg via ORAL
  Filled 2019-04-08 (×3): qty 1

## 2019-04-08 MED ORDER — KETAMINE HCL 50 MG/ML IJ SOLN
INTRAMUSCULAR | Status: DC | PRN
  Administered 2019-04-08: 35 mg via INTRAMUSCULAR

## 2019-04-08 MED ORDER — CHLORHEXIDINE GLUCONATE CLOTH 2 % EX PADS
6.0000 | MEDICATED_PAD | Freq: Every day | CUTANEOUS | Status: DC
  Administered 2019-04-08 – 2019-04-09 (×2): 6 via TOPICAL

## 2019-04-08 MED ORDER — HYDROMORPHONE HCL 1 MG/ML IJ SOLN: 0.2500 mg | INTRAMUSCULAR | Status: DC | PRN

## 2019-04-08 MED ORDER — VITAMIN D (ERGOCALCIFEROL) 1.25 MG (50000 UNIT) PO CAPS: 50000.0000 [IU] | ORAL_CAPSULE | ORAL | Status: DC

## 2019-04-08 MED ORDER — TRAZODONE HCL 50 MG PO TABS
25.0000 mg | ORAL_TABLET | Freq: Every evening | ORAL | Status: DC | PRN
  Administered 2019-04-08: 25 mg via ORAL
  Filled 2019-04-08: qty 1

## 2019-04-08 MED ORDER — ALENDRONATE SODIUM 70 MG PO TABS: 70.0000 mg | ORAL_TABLET | ORAL | Status: DC

## 2019-04-08 MED ORDER — OXYCODONE HCL 5 MG PO TABS
2.5000 mg | ORAL_TABLET | ORAL | Status: DC | PRN
  Administered 2019-04-09: 2.5 mg via ORAL
  Filled 2019-04-08: qty 1

## 2019-04-08 MED ORDER — ACETAMINOPHEN 325 MG PO TABS: 650.0000 mg | ORAL_TABLET | Freq: Four times a day (QID) | ORAL | Status: DC | PRN

## 2019-04-08 MED ORDER — METOCLOPRAMIDE HCL 5 MG/ML IJ SOLN: 5.0000 mg | Freq: Three times a day (TID) | INTRAMUSCULAR | Status: DC | PRN

## 2019-04-08 MED ORDER — MAGNESIUM OXIDE -MG SUPPLEMENT 500 MG PO TABS: 1.0000 | ORAL_TABLET | Freq: Every day | ORAL | Status: DC

## 2019-04-08 MED ORDER — MAGNESIUM HYDROXIDE 400 MG/5ML PO SUSP: 30.0000 mL | Freq: Every day | ORAL | Status: DC | PRN

## 2019-04-08 MED ORDER — OXYCODONE HCL 5 MG PO TABS: 5.0000 mg | ORAL_TABLET | Freq: Once | ORAL | Status: DC | PRN

## 2019-04-08 MED ORDER — MELATONIN 5 MG PO TABS
5.0000 mg | ORAL_TABLET | Freq: Every day | ORAL | Status: DC
  Administered 2019-04-08 – 2019-04-10 (×3): 5 mg via ORAL
  Filled 2019-04-08 (×3): qty 1

## 2019-04-08 MED ORDER — PROPOFOL 10 MG/ML IV BOLUS
INTRAVENOUS | Status: DC | PRN
  Administered 2019-04-08 (×2): 10 mg via INTRAVENOUS
  Administered 2019-04-08: 15 mg via INTRAVENOUS

## 2019-04-08 MED ORDER — ACETAMINOPHEN 650 MG RE SUPP: 650.0000 mg | Freq: Four times a day (QID) | RECTAL | Status: DC | PRN

## 2019-04-08 MED ORDER — LISINOPRIL 10 MG PO TABS
10.0000 mg | ORAL_TABLET | Freq: Every day | ORAL | Status: DC
  Administered 2019-04-10 – 2019-04-11 (×2): 10 mg via ORAL
  Filled 2019-04-08 (×3): qty 1

## 2019-04-08 MED ORDER — MUPIROCIN 2 % EX OINT
TOPICAL_OINTMENT | Freq: Two times a day (BID) | CUTANEOUS | Status: DC
  Filled 2019-04-08: qty 22

## 2019-04-08 MED ORDER — DEXTROSE 50 % IV SOLN
INTRAVENOUS | Status: AC
  Filled 2019-04-08: qty 50

## 2019-04-08 MED ORDER — FLEET ENEMA 7-19 GM/118ML RE ENEM: 1.0000 | ENEMA | Freq: Once | RECTAL | Status: DC | PRN

## 2019-04-08 SURGICAL SUPPLY — 48 items
"PENCIL ELECTRO HAND CTR " (MISCELLANEOUS) ×1 IMPLANT
BIT DRILL 4.0X280 (BIT) ×2 IMPLANT
BIT DRILL 5.0 CALIBRATED STEP (BIT) ×2 IMPLANT
BLADE SURG 15 STRL LF DISP TIS (BLADE) ×1 IMPLANT
BLADE SURG 15 STRL SS (BLADE) ×2
CANISTER SUCT 1200ML W/VALVE (MISCELLANEOUS) ×3 IMPLANT
CHLORAPREP W/TINT 26 (MISCELLANEOUS) ×3 IMPLANT
COVER WAND RF STERILE (DRAPES) ×3 IMPLANT
DRAPE 3/4 80X56 (DRAPES) ×3 IMPLANT
DRAPE SURG 17X11 SM STRL (DRAPES) ×6 IMPLANT
DRAPE U-SHAPE 47X51 STRL (DRAPES) ×6 IMPLANT
DRSG OPSITE POSTOP 3X4 (GAUZE/BANDAGES/DRESSINGS) ×9 IMPLANT
ELECT REM PT RETURN 9FT ADLT (ELECTROSURGICAL) ×3
ELECTRODE REM PT RTRN 9FT ADLT (ELECTROSURGICAL) ×1 IMPLANT
GLOVE BIOGEL PI IND STRL 8 (GLOVE) ×1 IMPLANT
GLOVE BIOGEL PI INDICATOR 8 (GLOVE) ×2
GLOVE SURG SYN 7.5  E (GLOVE) ×2
GLOVE SURG SYN 7.5 E (GLOVE) ×1 IMPLANT
GLOVE SURG SYN 7.5 PF PI (GLOVE) ×1 IMPLANT
GOWN STRL REUS W/ TWL LRG LVL3 (GOWN DISPOSABLE) ×1 IMPLANT
GOWN STRL REUS W/ TWL XL LVL3 (GOWN DISPOSABLE) ×1 IMPLANT
GOWN STRL REUS W/TWL LRG LVL3 (GOWN DISPOSABLE) ×2
GOWN STRL REUS W/TWL XL LVL3 (GOWN DISPOSABLE) ×2
GUIDE PIN 3.2X330 (PIN) ×3
GUIDEWIRE BALL NOSE 3.0X900 (WIRE) ×2 IMPLANT
KIT PATIENT CARE HANA TABLE (KITS) ×3 IMPLANT
KIT TURNOVER KIT A (KITS) ×3 IMPLANT
MAT ABSORB  FLUID 56X50 GRAY (MISCELLANEOUS) ×2
MAT ABSORB FLUID 56X50 GRAY (MISCELLANEOUS) ×2 IMPLANT
NAIL ES LONG 11X130DX39 (Nail) ×2 IMPLANT
NDL FILTER BLUNT 18X1 1/2 (NEEDLE) ×1 IMPLANT
NEEDLE FILTER BLUNT 18X 1/2SAF (NEEDLE) ×2
NEEDLE FILTER BLUNT 18X1 1/2 (NEEDLE) ×1 IMPLANT
NEEDLE HYPO 22GX1.5 SAFETY (NEEDLE) ×3 IMPLANT
NS IRRIG 1000ML POUR BTL (IV SOLUTION) ×3 IMPLANT
PACK HIP COMPR (MISCELLANEOUS) ×3 IMPLANT
PENCIL ELECTRO HAND CTR (MISCELLANEOUS) ×3 IMPLANT
PIN GUIDE 3.2X330 (PIN) IMPLANT
SCREW BONE CANC A/R 5X85 (Screw) ×2 IMPLANT
SCREW LAG GALILEO FEM 10.5X100 (Screw) ×2 IMPLANT
SCREW LOCK CORT 5X34 (Screw) ×2 IMPLANT
STAPLER SKIN PROX 35W (STAPLE) ×3 IMPLANT
SUT VIC AB 0 CT1 36 (SUTURE) ×4 IMPLANT
SUT VIC AB 2-0 CT2 27 (SUTURE) ×3 IMPLANT
SYR 10ML LL (SYRINGE) ×3 IMPLANT
SYR 30ML LL (SYRINGE) ×3 IMPLANT
TAPE CLOTH 3X10 WHT NS LF (GAUZE/BANDAGES/DRESSINGS) ×4 IMPLANT
TOOL ACTIVATION (INSTRUMENTS) ×2 IMPLANT

## 2019-04-08 NOTE — Progress Notes (Signed)
Brief hospitalist update note.  Please see same-day H&P for full billable details.  This is a nonbillable note  86 year old female who presents for evaluation after area mechanical fall and resultant comminuted right hip fracture.  Orthopedics consulted on admission.  Plan for OR for operative fixation today.  Patient n.p.o. for procedure.  When evaluated preoperatively patient is stable in no distress.  Pain is well controlled.  Patient is at above average risk for perioperative complications given her advanced age.  Lolita Patella MD

## 2019-04-08 NOTE — ED Provider Notes (Signed)
[facility] Emergency Department Provider Note  ____________________________________________   First MD Initiated Contact with Patient 04/08/19 [phone]     (approximate)  I have reviewed the triage vital signs and the nursing notes.   HISTORY  Chief Complaint Fall  Level 5 caveat:  history/ROS limited by chronic dementia  HPI Jenna Robinson is a 86 y.o. female with medical history as listed below which notably includes dementia.  She presents from her nursing facility by EMS after an unwitnessed fall.  No details are available.  She is calm, in good spirits, and cooperative.  She claims that she remembers falling but at this point says that nothing hurts except "my had a little bit".  She has skin tears to her right arm and initially told the triage nurse that she had some pain in her right hip.  I review of her medical history indicates that she has had urinary tract infections in the past when she has had similar unwitnessed falls.         Past Medical History:  Diagnosis Date  . Alzheimer disease   . Diabetes mellitus without complication ([place])   . Hypercholesteremia   . Hypertension   . Hyperthyroidism     Patient Active Problem List   Diagnosis Date Noted  . Closed right hip fracture ([place]) 04/08/2019  . Hypercalcemia 05/22/2015  . Elevated troponin 01/02/2015  . Rhabdomyolysis 01/02/2015    Past Surgical History:  Procedure Laterality Date  . ABDOMINAL HYSTERECTOMY    . DILATION AND CURETTAGE, DIAGNOSTIC / THERAPEUTIC      Prior to Admission medications   Medication Sig Start Date End Date Taking? Authorizing Provider  acetaminophen (TYLENOL) 325 MG tablet Take 650 mg by mouth every 6 (six) hours as needed for mild pain, moderate pain, fever or headache.   Yes [provider]  alendronate (FOSAMAX) 70 MG tablet Take 1 tablet by mouth once a week. With 8 oz of water at least 30 minutes before food/meds. Do not lie down  for 30 minutes after dose. 11/11/16  Yes [provider]  aspirin EC 81 MG tablet Take 81 mg by mouth daily.   Yes [provider]  atorvastatin (LIPITOR) 20 MG tablet Take 1 tablet (20 mg total) by mouth daily. 01/05/15  Yes Gladstone Lighter, MD  cholecalciferol (VITAMIN D) 25 MCG (1000 UNIT) tablet Take 2,000 Units by mouth daily.   Yes [provider]  cinacalcet (SENSIPAR) 30 MG tablet Take 30 mg by mouth every Monday, Wednesday, and Friday.   Yes [provider]  diphenhydrAMINE (BENADRYL) 50 MG capsule Take 50 mg by mouth at bedtime.   Yes [provider]  glipiZIDE (GLUCOTROL XL) 2.5 MG 24 hr tablet Take 2.5 mg by mouth daily with breakfast. *DO NOT CRUSH OR CHEW*   Yes [provider]  lisinopril (PRINIVIL,ZESTRIL) 10 MG tablet Take 1 tablet by mouth daily.   Yes [provider]  Magnesium Oxide 500 MG TABS Take 1 tablet by mouth daily.   Yes [provider]  Melatonin 10 MG CAPS Take 1 capsule by mouth at bedtime.   Yes [provider]  metFORMIN (GLUMETZA) 500 MG (MOD) 24 hr tablet Take 1,000 mg by mouth 2 (two) times daily.    Yes [provider]  oxybutynin (DITROPAN) 5 MG tablet Take 5 mg by mouth 2 (two) times daily.   Yes [provider]  zinc oxide 20 % ointment Apply 1 application topically 2 (  two) times daily. Apply topically to buttocks twice daily for dermatitis   Yes [provider]  atenolol (TENORMIN) 25 MG tablet Take 75 mg by mouth daily.     [provider]  insulin detemir (LEVEMIR) 100 unit/ml SOLN Inject 0.16 mLs (16 Units total) into the skin daily. Patient not taking: Reported on 04/08/2019 01/04/15   Gladstone Lighter, MD  Nicotine 21-14-7 MG/24HR KIT Place 21 mg onto the skin daily. Patient not taking: Reported on 05/22/2015 01/05/15   Gladstone Lighter, MD  traZODone (DESYREL) 50 MG tablet Take 25 mg by mouth at bedtime.    [provider]   Vitamin D, Ergocalciferol, (DRISDOL) 50000 units CAPS capsule Take 50,000 Units by mouth every 7 (seven) days.    [provider]    Allergies Patient has no known allergies.  Family History  Problem Relation Age of Onset  . Cancer Unknown        multiple family members including all siblings    Social History Social History   Tobacco Use  . Smoking status: Never Smoker  . Smokeless tobacco: Never Used  Substance Use Topics  . Alcohol use: No  . Drug use: Not on file    Review of Systems Level 5 caveat:  history/ROS limited by chronic dementia   ____________________________________________   PHYSICAL EXAM:  VITAL SIGNS: ED Triage Vitals  Enc Vitals Group     BP 04/08/19 0042 (!) 118/58     Pulse Rate 04/08/19 0042 77     Resp 04/08/19 0042 14     Temp 04/08/19 0042 98.2 F (36.8 C)     Temp Source 04/08/19 0042 Oral     SpO2 04/08/19 0042 100 %     Weight 04/08/19 0044 77.1 kg (170 lb)     Height 04/08/19 0044 1.803 m (5' 11" )     Head Circumference --      Peak Flow --      Pain Score --      Pain Loc --      Pain Edu? --      Excl. in [place]? --     Constitutional: Alert and oriented to self.  No acute distress while lying in bed. Eyes: Conjunctivae are normal.  Head: The patient has a hematoma to the back of her head and a possible contusion to her left cheek although this may be subacute.  She reports that the hematoma on the occiput is tender to palpation.  There is no open wound. Nose: No congestion/rhinnorhea. Mouth/Throat: Patient is wearing a mask. Neck: No stridor.  No meningeal signs.   Cardiovascular: Normal rate, regular rhythm. Good peripheral circulation. Grossly normal heart sounds. Respiratory: Normal respiratory effort.  No retractions. Gastrointestinal: Soft and nontender. No distention.  Musculoskeletal: The patient has no tenderness to palpation or pressure on the pelvis but when I passively range her right hip she reports some  pain.  No pain or tenderness in the left hip nor bilateral knees or lower legs.  No pain with range of motion of bilateral shoulders and elbows and wrists. Neurologic:  Normal speech and language. No gross focal neurologic deficits are appreciated.  Skin:  Skin is warm, dry and intact.  2 superficial skin tears to her right arm, one near the wrist and one on the forearm.  Subacute bruising to the anterior right shoulder that looks like it was from a previous fall. Psychiatric: Mood and affect are normal and appropriate given her age and history.  No emergent concerns or warning signs.  ____________________________________________   LABS (all labs ordered are listed, but only abnormal results are displayed)  Labs Reviewed  COMPREHENSIVE METABOLIC PANEL - Abnormal; Notable for the following components:      Result Value   Alkaline Phosphatase 201 (*)    All other components within normal limits  CBC WITH DIFFERENTIAL/PLATELET - Abnormal; Notable for the following components:   RBC 3.58 (*)    Hemoglobin 10.8 (*)    HCT 35.0 (*)    All other components within normal limits  APTT - Abnormal; Notable for the following components:   aPTT 39 (*)    All other components within normal limits  RESPIRATORY PANEL BY RT PCR (FLU A&B, COVID)  URINE CULTURE  SURGICAL PCR SCREEN  MRSA PCR SCREENING  PROTIME-INR  URINALYSIS, ROUTINE W REFLEX MICROSCOPIC  TSH  TYPE AND SCREEN   ____________________________________________  EKG  ED ECG REPORT I, Hinda Kehr, the attending physician, personally viewed and interpreted this ECG.  Date: 04/08/2019 EKG Time: 2:53 AM Rate: 74 Rhythm: normal sinus rhythm QRS Axis: normal Intervals: normal ST/T Wave abnormalities: Non-specific ST segment / T-wave changes, but no clear evidence of acute ischemia. Narrative Interpretation: no definitive evidence of acute ischemia; does not meet STEMI  criteria.   ____________________________________________  RADIOLOGY I, Hinda Kehr, personally viewed and evaluated these images (plain radiographs) as part of my medical decision making, as well as reviewing the written report by the radiologist.  ED MD interpretation: Right comminuted greater trochanteric fracture and incomplete right femoral neck fracture.  No acute abnormalities on chest x-ray.  Official radiology report(s): CT Head Wo Contrast  Result Date: 04/08/2019 CLINICAL DATA:  86 year old dementia patient post unwitnessed fall. EXAM: CT HEAD WITHOUT CONTRAST TECHNIQUE: Contiguous axial images were obtained from the base of the skull through the vertex without intravenous contrast. COMPARISON:  Head CT 08/29/2015 FINDINGS: Brain: No intracranial hemorrhage, mass effect, or midline shift. Age related atrophy. No hydrocephalus. The basilar cisterns are patent. Moderate to advanced chronic small vessel ischemia. No evidence of territorial infarct or acute ischemia. No extra-axial or intracranial fluid collection. Vascular: Atherosclerosis of skullbase vasculature without hyperdense vessel or abnormal calcification. Skull: No fracture or focal lesion. Sinuses/Orbits: Paranasal sinuses and mastoid air cells are clear. The visualized orbits are unremarkable. Other: Small right parietal scalp hematoma. IMPRESSION: 1. Small right parietal scalp hematoma. No acute intracranial abnormality. No skull fracture. 2. Age related atrophy and chronic small vessel ischemia. Electronically Signed   By: Keith Rake M.D.   On: 04/08/2019 01:47   CT Cervical Spine Wo Contrast  Result Date: 04/08/2019 CLINICAL DATA:  86 year old dementia patient post unwitnessed fall. EXAM: CT CERVICAL SPINE WITHOUT CONTRAST TECHNIQUE: Multidetector CT imaging of the cervical spine was performed without intravenous contrast. Multiplanar CT image reconstructions were also generated. COMPARISON:  08/29/2015 FINDINGS:  Alignment: Reversal of normal lordosis. Minimal anterolisthesis of C3 on C4 and C7 on T1, likely degenerative. No jumped or perched facets. Skull base and vertebrae: No acute fracture. Vertebral body heights are maintained. The dens and skull base are intact. Degenerative pannus at C1-C2. Soft tissues and spinal canal: No prevertebral fluid or swelling. No visible canal hematoma. Disc levels: Disc space narrowing and endplate spurring most prominent at C4-C5, C5-C6, and C6-C7. Multilevel facet hypertrophy. Degenerative changes are grossly stable from prior exam. Upper chest: Apical pleuroparenchymal scarring. No acute findings. Other: Carotid calcifications. IMPRESSION: Multilevel degenerative change throughout the cervical spine without acute fracture or subluxation.  Electronically Signed   By: Keith Rake M.D.   On: 04/08/2019 01:50   CT Hip Right Wo Contrast  Result Date: 04/08/2019 CLINICAL DATA:  Greater trochanter fracture, evaluate for intratrochanteric extension EXAM: CT OF THE RIGHT HIP WITHOUT CONTRAST TECHNIQUE: Multidetector CT imaging of the right hip was performed according to the standard protocol. Multiplanar CT image reconstructions were also generated. COMPARISON:  None. FINDINGS: Bones/Joint/Cartilage There is a extensively comminuted mildly displaced greater trochanter fracture. There are fracture fragments seen superiorly and posteriorly displaced around the hip. There also appears to be a probable incomplete nondisplaced fracture seen through the superior femoral neck best seen on series 4, image 49. No intratrochanteric extension is noted. The femoral head is still well seated within the acetabulum. There is moderate to advanced femoroacetabular joint osteoarthritis. Diffuse osteopenia is noted. A small hip joint effusion is seen. Ligaments Suboptimally assessed by CT. Muscles and Tendons Edema is noted within the gluteal musculature. The remainder of the muscles appear to be grossly  intact. Heterogeneous appearance to the insertion site of the gluteal tendons, likely a partial disruption. The hamstrings tendons are intact. Soft tissues Subcutaneous edema and soft tissue swelling seen over the posterior lateral hip. There is a partially visualized right adnexal cystic mass measuring 4.0 x 2.9 by 3.6 cm. IMPRESSION: 1. Extensively comminuted mildly displaced greater trochanter fracture. No intertrochanteric extension. 2. There is also a incomplete nondisplaced fracture seen through the superior femoral neck. 3. 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 4. Diffuse gluteal musculature edema with a probable partial disruption of the gluteal tendon insertion site 5. Partially visualized nonspecific cystic mass within the right adnexa measuring 4.0 x 2.9 by 3.6 cm which could represent a lymphocele/peritoneal inclusion cyst or paraovarian cyst. If further evaluation is required would recommend CT abdomen pelvis with contrast when patient is stable. Electronically Signed   By: Prudencio Pair M.D.   On: 04/08/2019 03:44   DG Chest Port 1 View  Result Date: 04/08/2019 CLINICAL DATA:  Preop EXAM: PORTABLE CHEST 1 VIEW COMPARISON:  May 24, 2015 FINDINGS: The heart size and mediastinal contours are within normal limits. Aortic knob calcifications. Both lungs are clear. No acute osseous abnormality. IMPRESSION: No active disease. Electronically Signed   By: Prudencio Pair M.D.   On: 04/08/2019 02:42   DG Hip Unilat W or Wo Pelvis 2-3 Views Right  Result Date: 04/08/2019 CLINICAL DATA:  Pain after unwitnessed fall EXAM: DG HIP (WITH OR WITHOUT PELVIS) 2-3V RIGHT COMPARISON:  January 01, 2015 FINDINGS: There is a comminuted mildly displaced right greater trochanter fracture. There is diffuse osteopenia. No other fractures identified. Moderate bilateral hip osteoarthritis is seen with superior joint space loss and marginal osteophyte formation. Degenerative changes in the lower lumbar  spine. Soft tissue swelling and subcutaneous edema overlying the right hip. IMPRESSION: Comminuted mildly displaced right greater trochanter fracture. Electronically Signed   By: Prudencio Pair M.D.   On: 04/08/2019 01:38    ____________________________________________   PROCEDURES   Procedure(s) performed (including Critical Care):  Procedures   ____________________________________________   INITIAL IMPRESSION / MDM / [place] / ED COURSE  As part of my medical decision making, I reviewed the following data within the [place] notes reviewed and incorporated, Labs reviewed , EKG interpreted , Old chart reviewed, Radiograph reviewed , A consult was requested and obtained from this/these consultant(s) Orthopedics and Notes from prior ED visits   Differential diagnosis includes, but is not limited to,  mechanical fall, intracranial bleeding, hip or pelvic fracture, urinary tract infection, electrolyte or metabolic abnormality.  The patient is generally well-appearing and in no distress, conversant, with essentially normal vitals.  I think there is little benefit to checking lab work particularly given that we have no results against which to compare since 2017 and not even in care everywhere are there any results since 2018.  Baselines can change in given that she is in no distress, tolerating oral intake, and there is no indication or benefit to getting the lab work, will hold off for now.  I think it is reasonable to check a urinalysis because I found in the medical record that she had a urinary tract infection that required antibiotic treatment in the past when she had a fall although if the results are suggestive of asymptomatic bacteriuria I may defer treatment for the urine culture rather than treat empirically and risk the complications of antibiotics.  CT scan of the head and neck are pending given the obvious hematoma to her occiput and questionable  contusion to her left cheek although that appears subacute.  She also has a subacute bruise to the anterior right shoulder and the new skin tears to the right arm but there is no indication of any bony injury to the right upper extremity.  I have a low suspicion for bony injury to the pelvis or right hip but I will check radiographs.      Clinical Course as of Apr 07 745  Sat Apr 08, 2019  0215 CT scans are reassuring but the patient has a greater trochanter fracture on the right side.  I have ordered the rest of the usual preop work-up for hip fracture.  Paging orthopedics to discuss and then I will touch base with family.   [CF]  0320 I discussed the imaging with Dr. Posey Pronto with orthopedics and he reviewed the radiographs.  He said that if this is an isolated greater trochanteric fracture she will not need surgery and could be managed conservatively.  He recommended a CT hip for further evaluation.The ED staff checked with the patient's family and they said that she is ambulatory with a walker at baseline although she is unsteady and has frequent falls.  Lab work is pending.  I will reassess after the hip imaging is back.   [CF]  [phone] Though the fracture is not intertrochanteric, there is also a femoral neck fracture.  I have paged Dr. Posey Pronto to discuss.  CT Hip Right Wo Contrast [CF]  0354 SARS Coronavirus 2 by RT PCR: NEGATIVE [CF]  0430 Lab work unremarkable and reassuring.  Family updated by ED RN Olen Cordial.  Consulted hospitalist for admission.   [CF]  [phone] Discussed with Dr. Sidney Ace who will admit.   [CF]    Clinical Course User Index [CF] Hinda Kehr, MD     ____________________________________________  FINAL CLINICAL IMPRESSION(S) / ED DIAGNOSES  Final diagnoses:  Displaced fracture of greater trochanter of right femur, initial encounter for closed fracture ([place])  Closed displaced fracture of right femoral neck ([place])  Dementia without behavioral disturbance, unspecified dementia  type ([place])  Ovarian mass, right     MEDICATIONS GIVEN DURING THIS VISIT:  Medications  atorvastatin (LIPITOR) tablet 20 mg (has no administration in time range)  lisinopril (ZESTRIL) tablet 10 mg (has no administration in time range)  cinacalcet (SENSIPAR) tablet 30 mg (has no administration in time range)  oxybutynin (DITROPAN) tablet 5 mg (has no administration in time range)  cholecalciferol (VITAMIN D3) tablet 2,000 Units (has no administration in time range)  diphenhydrAMINE (BENADRYL) capsule 50 mg (has no administration in time range)  0.9 %  sodium chloride infusion ( Intravenous New Bag/Given 04/08/19 0628)  acetaminophen (TYLENOL) tablet 650 mg (has no administration in time range)    Or  acetaminophen (TYLENOL) suppository 650 mg (has no administration in time range)  oxyCODONE (Oxy IR/ROXICODONE) immediate release tablet 5 mg (has no administration in time range)  traZODone (DESYREL) tablet 25 mg (has no administration in time range)  magnesium hydroxide (MILK OF MAGNESIA) suspension 30 mL (has no administration in time range)  ondansetron (ZOFRAN) tablet 4 mg (has no administration in time range)    Or  ondansetron (ZOFRAN) injection 4 mg (has no administration in time range)  magnesium oxide (MAG-OX) tablet 400 mg (has no administration in time range)  melatonin tablet 5 mg (has no administration in time range)     ED Discharge Orders    None      *Please note:  Frona Yost was evaluated in Emergency Department on 04/08/2019 for the symptoms described in the history of present illness. She was evaluated in the context of the global COVID-19 pandemic, which necessitated consideration that the patient might be at risk for infection with the SARS-CoV-2 virus that causes COVID-19. Institutional protocols and algorithms that pertain to the evaluation of patients at risk for COVID-19 are in a state of rapid change based on information released by regulatory bodies  including the CDC and federal and state organizations. These policies and algorithms were followed during the patient's care in the ED.  Some ED evaluations and interventions may be delayed as a result of limited staffing during the pandemic.*  Note:  This document was prepared using Dragon voice recognition software and may include unintentional dictation errors.   Hinda Kehr, MD 04/08/19 [phone]

## 2019-04-08 NOTE — Anesthesia Procedure Notes (Signed)
Date/Time: 04/08/2019 11:29 AM Performed by: Ginger Carne, CRNA Pre-anesthesia Checklist: Patient identified, Emergency Drugs available, Suction available, Patient being monitored and Timeout performed Patient Re-evaluated:Patient Re-evaluated prior to induction Oxygen Delivery Method: Simple face mask Preoxygenation: Pre-oxygenation with 100% oxygen

## 2019-04-08 NOTE — Anesthesia Preprocedure Evaluation (Addendum)
Anesthesia Evaluation  Patient identified by MRN, date of birth, ID band Patient awake    Reviewed: Allergy & Precautions, H&P , NPO status , Patient's Chart, lab work & pertinent test results  History of Anesthesia Complications Negative for: history of anesthetic complications  Airway Mallampati: III  TM Distance: >3 FB Neck ROM: limited    Dental  (+) Chipped, Poor Dentition, Missing, Partial Upper, Partial Lower   Pulmonary neg pulmonary ROS, neg shortness of breath,           Cardiovascular hypertension, (-) CAD      Neuro/Psych PSYCHIATRIC DISORDERS Dementia  Neuromuscular disease    GI/Hepatic negative GI ROS, Neg liver ROS,   Endo/Other  diabetes, Type 2Hyperthyroidism   Renal/GU      Musculoskeletal   Abdominal   Peds  Hematology negative hematology ROS (+)   Anesthesia Other Findings Past Medical History: No date: Alzheimer disease No date: Diabetes mellitus without complication (HCC) No date: Hypercholesteremia No date: Hypertension No date: Hyperthyroidism  Past Surgical History: No date: ABDOMINAL HYSTERECTOMY No date: DILATION AND CURETTAGE, DIAGNOSTIC / THERAPEUTIC  BMI    Body Mass Index: 23.71 kg/m      Reproductive/Obstetrics negative OB ROS                            Anesthesia Physical Anesthesia Plan  ASA: III  Anesthesia Plan: Spinal   Post-op Pain Management:    Induction:   PONV Risk Score and Plan:   Airway Management Planned: Natural Airway and Nasal Cannula  Additional Equipment:   Intra-op Plan:   Post-operative Plan:   Informed Consent: I have reviewed the patients History and Physical, chart, labs and discussed the procedure including the risks, benefits and alternatives for the proposed anesthesia with the patient or authorized representative who has indicated his/her understanding and acceptance.     Dental Advisory Given and  Consent reviewed with POA  Plan Discussed with: Anesthesiologist, CRNA and Surgeon  Anesthesia Plan Comments: (History and consent from the [name] the POA at  [phone]   [name] reports no bleeding problems and no anticoagulant use.  Plan for spinal with backup GA  [name] consented for risks of anesthesia including but not limited to:  - adverse reactions to medications - risk of bleeding, infection, nerve damage and headache - risk of failed spinal - damage to teeth, lips or other oral mucosa - sore throat or hoarseness - Damage to heart, brain, lungs or loss of life  She voiced understanding.)       Anesthesia Quick Evaluation

## 2019-04-08 NOTE — Progress Notes (Signed)
PT Cancellation Note  Patient Details Name: Kenadi Miltner MRN: [id number] DOB: 04/28/1925   Cancelled Treatment:    Reason Eval/Treat Not Completed: (Consult received and chart reviewed. Per notes, patient s/p R hip ORIF this date.  Per guidelines, will initiate PT evaluation next date.)   Dutchess Crosland H. Manson Passey, PT, DPT, NCS 04/08/19, 3:16 PM [phone]

## 2019-04-08 NOTE — OR Nursing (Signed)
Prior to foley insertion blood tinged urine noted to be leaking from patient. MD made aware. Foley placed for procedure, urine draining in bag clear yellow. No further orders by MD at present time.

## 2019-04-08 NOTE — Anesthesia Procedure Notes (Signed)
Spinal  Patient location during procedure: OR Start time: 04/08/2019 11:44 AM End time: 04/08/2019 11:52 AM Staffing Performed: anesthesiologist  Anesthesiologist: Piscitello, Precious Haws, MD Resident/CRNA: Johnna Acosta, CRNA Preanesthetic Checklist Completed: patient identified, IV checked, site marked, risks and benefits discussed, surgical consent, monitors and equipment checked, pre-op evaluation and timeout performed Spinal Block Patient position: sitting Prep: ChloraPrep Patient monitoring: heart rate, continuous pulse ox, blood pressure and cardiac monitor Approach: midline Location: L3-4 Injection technique: single-shot Needle Needle type: [place] and Introducer  Needle gauge: 24 G Needle length: 9 cm Assessment Sensory level: T10 Additional Notes Negative paresthesia. Negative blood return. Positive free-flowing CSF. Expiration date of kit checked and confirmed. Patient tolerated procedure well, without complications.

## 2019-04-08 NOTE — Progress Notes (Signed)
Full consult note and discussion with patient to follow later today.  Called by ED staff. Imaging reviewed. Patient has non-displaced hip fracture. Will plan to discuss with patient's POA later this morning. - Plan for surgery later in the AM vs afternoon pending OR availability.  - NPO until OR - Hold anticoagulation - Admit to Hospitalist team.

## 2019-04-08 NOTE — Progress Notes (Signed)
Patient to OR

## 2019-04-08 NOTE — Consult Note (Addendum)
ORTHOPAEDIC CONSULTATION  REQUESTING PHYSICIAN: Sidney Ace, MD  Chief Complaint:   R hip pain  History of Present Illness: History obtained from patient's guardian, Jenna Robinson, and in discussion with ED staff. Patient unable to give full history due to dementia.  Jenna Robinson is a 86 y.o. female who had a fall at her assisted living facility yesterday.  The patient noted immediate hip pain and difficulty with ambulation.  The patient ambulates with a walker at baseline.  Pain is described as sharp at its worst and a dull ache at its best.  Pain is rated a 6 out of 10 in severity.  Pain is improved with rest and immobilization.  Pain is worse with attempted movement.  Imaging in the emergency department show a displaced greater trochanteric fracture and nondisplaced intertrochanteric and low femoral neck fracture lines.  Past Medical History:  Diagnosis Date  . Alzheimer disease ([place])   . Diabetes mellitus without complication ([place])   . Hypercholesteremia   . Hypertension   . Hyperthyroidism    Past Surgical History:  Procedure Laterality Date  . ABDOMINAL HYSTERECTOMY    . DILATION AND CURETTAGE, DIAGNOSTIC / THERAPEUTIC     Social History   Socioeconomic History  . Marital status: Widowed    Spouse name: Not on file  . Number of children: Not on file  . Years of education: Not on file  . Highest education level: Not on file  Occupational History  . Not on file  Tobacco Use  . Smoking status: Never Smoker  . Smokeless tobacco: Never Used  Substance and Sexual Activity  . Alcohol use: No  . Drug use: Not on file  . Sexual activity: Not on file  Other Topics Concern  . Not on file  Social History Narrative  . Not on file   Social Determinants of Health   Financial Resource Strain:   . Difficulty of Paying Living Expenses:   Food Insecurity:   . Worried About Charity fundraiser in the Last  Year:   . Arboriculturist in the Last Year:   Transportation Needs:   . Film/video editor (Medical):   Marland Kitchen Lack of Transportation (Non-Medical):   Physical Activity:   . Days of Exercise per Week:   . Minutes of Exercise per Session:   Stress:   . Feeling of Stress :   Social Connections:   . Frequency of Communication with Friends and Family:   . Frequency of Social Gatherings with Friends and Family:   . Attends Religious Services:   . Active Member of Clubs or Organizations:   . Attends Archivist Meetings:   Marland Kitchen Marital Status:    Family History  Problem Relation Age of Onset  . Cancer Other        multiple family members including all siblings   No Known Allergies Prior to Admission medications   Medication Sig Start Date End Date Taking? Authorizing Provider  acetaminophen (TYLENOL) 325 MG tablet Take 650 mg by mouth every 6 (six) hours as needed for mild pain, moderate pain, fever or headache.   Yes [provider]  alendronate (FOSAMAX) 70 MG tablet Take 1 tablet by mouth once a week. With 8 oz of water at least 30 minutes before food/meds. Do not lie down for 30 minutes after dose. 11/11/16  Yes [provider]  aspirin EC 81 MG tablet Take 81 mg by mouth daily.   Yes [provider]  atorvastatin (LIPITOR) 20 MG tablet Take 1 tablet (20 mg total) by mouth daily. 01/05/15  Yes Gladstone Lighter, MD  cholecalciferol (VITAMIN D) 25 MCG (1000 UNIT) tablet Take 2,000 Units by mouth daily.   Yes [provider]  cinacalcet (SENSIPAR) 30 MG tablet Take 30 mg by mouth every Monday, Wednesday, and Friday.   Yes [provider]  diphenhydrAMINE (BENADRYL) 50 MG capsule Take 50 mg by mouth at bedtime.   Yes [provider]  glipiZIDE (GLUCOTROL XL) 2.5 MG 24 hr tablet Take 2.5 mg by mouth daily with breakfast. *DO NOT CRUSH OR CHEW*   Yes [provider]  lisinopril (PRINIVIL,ZESTRIL) 10 MG tablet Take 1  tablet by mouth daily.   Yes [provider]  Magnesium Oxide 500 MG TABS Take 1 tablet by mouth daily.   Yes [provider]  Melatonin 10 MG CAPS Take 1 capsule by mouth at bedtime.   Yes [provider]  metFORMIN (GLUMETZA) 500 MG (MOD) 24 hr tablet Take 1,000 mg by mouth 2 (two) times daily.    Yes [provider]  oxybutynin (DITROPAN) 5 MG tablet Take 5 mg by mouth 2 (two) times daily.   Yes [provider]  zinc oxide 20 % ointment Apply 1 application topically 2 (two) times daily. Apply topically to buttocks twice daily for dermatitis   Yes [provider]  atenolol (TENORMIN) 25 MG tablet Take 75 mg by mouth daily.     [provider]  insulin detemir (LEVEMIR) 100 unit/ml SOLN Inject 0.16 mLs (16 Units total) into the skin daily. Patient not taking: Reported on 04/08/2019 01/04/15   Gladstone Lighter, MD  Nicotine 21-14-7 MG/24HR KIT Place 21 mg onto the skin daily. Patient not taking: Reported on 05/22/2015 01/05/15   Gladstone Lighter, MD  traZODone (DESYREL) 50 MG tablet Take 25 mg by mouth at bedtime.    [provider]  Vitamin D, Ergocalciferol, (DRISDOL) 50000 units CAPS capsule Take 50,000 Units by mouth every 7 (seven) days.    [provider]   Recent Labs    04/08/19 0055  WBC 10.0  HGB 10.8*  HCT 35.0*  PLT 322  K 4.2  CL 99  CO2 26  BUN 22  CREATININE 0.76  GLUCOSE 75  CALCIUM 9.7  INR 1.0   CT Head Wo Contrast  Result Date: 04/08/2019 CLINICAL DATA:  86 year old dementia patient post unwitnessed fall. EXAM: CT HEAD WITHOUT CONTRAST TECHNIQUE: Contiguous axial images were obtained from the base of the skull through the vertex without intravenous contrast. COMPARISON:  Head CT 08/29/2015 FINDINGS: Brain: No intracranial hemorrhage, mass effect, or midline shift. Age related atrophy. No hydrocephalus. The basilar cisterns are patent. Moderate to advanced chronic small vessel  ischemia. No evidence of territorial infarct or acute ischemia. No extra-axial or intracranial fluid collection. Vascular: Atherosclerosis of skullbase vasculature without hyperdense vessel or abnormal calcification. Skull: No fracture or focal lesion. Sinuses/Orbits: Paranasal sinuses and mastoid air cells are clear. The visualized orbits are unremarkable. Other: Small right parietal scalp hematoma. IMPRESSION: 1. Small right parietal scalp hematoma. No acute intracranial abnormality. No skull fracture. 2. Age related atrophy and chronic small vessel ischemia. Electronically Signed   By: Keith Rake M.D.   On: 04/08/2019 01:47   CT Cervical Spine Wo Contrast  Result Date: 04/08/2019 CLINICAL DATA:  86 year old dementia patient post unwitnessed fall. EXAM: CT CERVICAL SPINE WITHOUT CONTRAST TECHNIQUE: Multidetector CT imaging of the cervical spine was performed without intravenous  contrast. Multiplanar CT image reconstructions were also generated. COMPARISON:  08/29/2015 FINDINGS: Alignment: Reversal of normal lordosis. Minimal anterolisthesis of C3 on C4 and C7 on T1, likely degenerative. No jumped or perched facets. Skull base and vertebrae: No acute fracture. Vertebral body heights are maintained. The dens and skull base are intact. Degenerative pannus at C1-C2. Soft tissues and spinal canal: No prevertebral fluid or swelling. No visible canal hematoma. Disc levels: Disc space narrowing and endplate spurring most prominent at C4-C5, C5-C6, and C6-C7. Multilevel facet hypertrophy. Degenerative changes are grossly stable from prior exam. Upper chest: Apical pleuroparenchymal scarring. No acute findings. Other: Carotid calcifications. IMPRESSION: Multilevel degenerative change throughout the cervical spine without acute fracture or subluxation. Electronically Signed   By: Keith Rake M.D.   On: 04/08/2019 01:50   CT Hip Right Wo Contrast  Result Date: 04/08/2019 CLINICAL DATA:  Greater trochanter  fracture, evaluate for intratrochanteric extension EXAM: CT OF THE RIGHT HIP WITHOUT CONTRAST TECHNIQUE: Multidetector CT imaging of the right hip was performed according to the standard protocol. Multiplanar CT image reconstructions were also generated. COMPARISON:  None. FINDINGS: Bones/Joint/Cartilage There is a extensively comminuted mildly displaced greater trochanter fracture. There are fracture fragments seen superiorly and posteriorly displaced around the hip. There also appears to be a probable incomplete nondisplaced fracture seen through the superior femoral neck best seen on series 4, image 49. No intratrochanteric extension is noted. The femoral head is still well seated within the acetabulum. There is moderate to advanced femoroacetabular joint osteoarthritis. Diffuse osteopenia is noted. A small hip joint effusion is seen. Ligaments Suboptimally assessed by CT. Muscles and Tendons Edema is noted within the gluteal musculature. The remainder of the muscles appear to be grossly intact. Heterogeneous appearance to the insertion site of the gluteal tendons, likely a partial disruption. The hamstrings tendons are intact. Soft tissues Subcutaneous edema and soft tissue swelling seen over the posterior lateral hip. There is a partially visualized right adnexal cystic mass measuring 4.0 x 2.9 by 3.6 cm. IMPRESSION: 1. Extensively comminuted mildly displaced greater trochanter fracture. No intertrochanteric extension. 2. There is also a incomplete nondisplaced fracture seen through the superior femoral neck. 3. 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 4. Diffuse gluteal musculature edema with a probable partial disruption of the gluteal tendon insertion site 5. Partially visualized nonspecific cystic mass within the right adnexa measuring 4.0 x 2.9 by 3.6 cm which could represent a lymphocele/peritoneal inclusion cyst or paraovarian cyst. If further evaluation is required would recommend CT  abdomen pelvis with contrast when patient is stable. Electronically Signed   By: Prudencio Pair M.D.   On: 04/08/2019 03:44   DG Chest Port 1 View  Result Date: 04/08/2019 CLINICAL DATA:  Preop EXAM: PORTABLE CHEST 1 VIEW COMPARISON:  May 24, 2015 FINDINGS: The heart size and mediastinal contours are within normal limits. Aortic knob calcifications. Both lungs are clear. No acute osseous abnormality. IMPRESSION: No active disease. Electronically Signed   By: Prudencio Pair M.D.   On: 04/08/2019 02:42   DG Hip Unilat W or Wo Pelvis 2-3 Views Right  Result Date: 04/08/2019 CLINICAL DATA:  Pain after unwitnessed fall EXAM: DG HIP (WITH OR WITHOUT PELVIS) 2-3V RIGHT COMPARISON:  January 01, 2015 FINDINGS: There is a comminuted mildly displaced right greater trochanter fracture. There is diffuse osteopenia. No other fractures identified. Moderate bilateral hip osteoarthritis is seen with superior joint space loss and marginal osteophyte formation. Degenerative changes in the lower lumbar spine. Soft tissue swelling and subcutaneous  edema overlying the right hip. IMPRESSION: Comminuted mildly displaced right greater trochanter fracture. Electronically Signed   By: Prudencio Pair M.D.   On: 04/08/2019 01:38     Positive ROS: All other systems have been reviewed and were otherwise negative with the exception of those mentioned in the HPI and as above.  Physical Exam: BP 138/70 (BP Location: Left Arm)   Pulse 70   Temp 97.7 F (36.5 C)   Resp 17   Ht 5' 11"  (1.803 m)   Wt 77.1 kg   SpO2 100%   BMI 23.71 kg/m  General:  Alert, no acute distress, A&O to name only Psychiatric:  Patient is NOT competent for consent, non-agitated   Cardiovascular:  No pedal edema, regular rate and rhythm Respiratory:  No wheezing, non-labored breathing GI:  Abdomen is soft and non-tender Skin:  No notable lesions other than in the area of chief complaint, no erythema Neurologic:  Sensation intact distally, CN grossly  intact Lymphatic:  No axillary or cervical lymphadenopathy  Orthopedic Exam:  RLE: 5/5 DF/PF/EHL SILT s/s/t/sp/dp distr Foot wwp +Log rolll Significant ttp over lateral hip with associated bruising   X-rays/CT scan:  Displaced greater trochanteric fracture visualized on radiographs and CT scan. CT scan also shows low femoral neck fracture line, and despite radiology report, a clearly visible nondisplaced intertrochanteric fracture line is visible  Assessment/Plan: Raeleen Winstanley is a 86 y.o. female with a displaced greater trochanteric fracture and nondisplaced intertrochanteric and low femoral neck fracture lines   1. I discussed the various treatment options including both surgical and non-surgical management of her fracture with the patient's guardian and PoA, Jenna Robinson. We discussed the high risk of perioperative complications due to patient's age, dementia, and other co-morbidities. After discussion of risks, benefits, and alternatives to surgery, the patient's guardian and patient were in agreement to proceed with surgery. The goals of surgery would be to provide adequate pain relief and allow for early mobilization. Plan for surgery is R hip cephalomedullary nailing today, 04/08/19 2. NPO until OR 3. Hold anticoagulation in advance of OR    Leim Fabry   04/08/2019 11:23 AM

## 2019-04-08 NOTE — H&P (Signed)
H&P reviewed. No significant changes noted.  

## 2019-04-08 NOTE — H&P (Signed)
[facility] [place] NAME: Jenna Robinson    MR#:  [id number]  DATE OF BIRTH:  05/24/1925  DATE OF ADMISSION:  04/08/2019  PRIMARY CARE PHYSICIAN: Elba Barman, MD   REQUESTING/REFERRING PHYSICIAN: Hinda Kehr, MD  CHIEF COMPLAINT:   Chief Complaint  Patient presents with  . Fall    HISTORY OF PRESENT ILLNESS:  Jenna Robinson  is a 86 y.o. Caucasian female with a known history of type diabetes mellitus, hypertension, dyslipidemia and Alzheimer's dementia, who presented to the emergency room with acute onset of recent recurrent falls.  She denied loss of consciousness, paresthesias or focal muscle weakness.  She stated that she got up from her couch and lost her balance before she fell.  No chest pain or dyspnea palpitations cough or wheezing or hemoptysis.  No dysuria, oliguria or hematuria or flank pain.  Upon presentation to the emergency room respiratory rate was 24 and otherwise vital signs within normal.  Labs revealed unremarkable CMP and mild anemia worse than previous levels.  COVID-19 PCR and influenza antigens came back negative.  Portable chest x-ray showed no acute cardiopulmonary disease.  Right hip CT showed extensive comminuted mildly displaced greater trochanteric fracture and an incomplete nondisplaced fracture through the superior femoral neck with moderate to advanced osteoarthritis and diffuse gluteal musculature edema with probable partial disruption of the gluteal tendon insertion site.  Also showed nonspecific cystic mass within the right adnexa that can represent a lymphocele or peritoneal inclusion cyst or paraovarian cyst.  The patient was given hydration with IV normal saline.  Dr. Leim Fabry was notified about the patient.  She will be admitted to a medical bed for further evaluation and management. PAST MEDICAL HISTORY:   Past Medical History:  Diagnosis Date  . Alzheimer disease   . Diabetes mellitus without complication  ([place])   . Hypercholesteremia   . Hypertension   . Hyperthyroidism     PAST SURGICAL HISTORY:   Past Surgical History:  Procedure Laterality Date  . ABDOMINAL HYSTERECTOMY    . DILATION AND CURETTAGE, DIAGNOSTIC / THERAPEUTIC      SOCIAL HISTORY:   Social History   Tobacco Use  . Smoking status: Never Smoker  . Smokeless tobacco: Never Used  Substance Use Topics  . Alcohol use: No    FAMILY HISTORY:   Family History  Problem Relation Age of Onset  . Cancer Unknown        multiple family members including all siblings    DRUG ALLERGIES:  No Known Allergies  REVIEW OF SYSTEMS:   ROS As per history of present illness. All pertinent systems were reviewed above. Constitutional,  HEENT, cardiovascular, respiratory, GI, GU, musculoskeletal, neuro, psychiatric, endocrine,  integumentary and hematologic systems were reviewed and are otherwise  negative/unremarkable except for positive findings mentioned above in the HPI.   MEDICATIONS AT HOME:   Prior to Admission medications   Medication Sig Start Date End Date Taking? Authorizing Provider  acetaminophen (TYLENOL) 325 MG tablet Take 650 mg by mouth every 6 (six) hours as needed for mild pain, moderate pain, fever or headache.   Yes [provider]  alendronate (FOSAMAX) 70 MG tablet Take 1 tablet by mouth once a week. With 8 oz of water at least 30 minutes before food/meds. Do not lie down for 30 minutes after dose. 11/11/16  Yes [provider]  aspirin EC 81 MG tablet Take 81 mg by mouth daily.   Yes [provider]  atorvastatin (LIPITOR) 20 MG tablet Take 1 tablet (20 mg total) by mouth daily. 01/05/15  Yes Gladstone Lighter, MD  cholecalciferol (VITAMIN D) 25 MCG (1000 UNIT) tablet Take 2,000 Units by mouth daily.   Yes [provider]  cinacalcet (SENSIPAR) 30 MG tablet Take 30 mg by mouth every Monday, Wednesday, and Friday.   Yes [provider]  diphenhydrAMINE  (BENADRYL) 50 MG capsule Take 50 mg by mouth at bedtime.   Yes [provider]  glipiZIDE (GLUCOTROL XL) 2.5 MG 24 hr tablet Take 2.5 mg by mouth daily with breakfast. *DO NOT CRUSH OR CHEW*   Yes [provider]  lisinopril (PRINIVIL,ZESTRIL) 10 MG tablet Take 1 tablet by mouth daily.   Yes [provider]  Magnesium Oxide 500 MG TABS Take 1 tablet by mouth daily.   Yes [provider]  Melatonin 10 MG CAPS Take 1 capsule by mouth at bedtime.   Yes [provider]  metFORMIN (GLUMETZA) 500 MG (MOD) 24 hr tablet Take 1,000 mg by mouth 2 (two) times daily.    Yes [provider]  oxybutynin (DITROPAN) 5 MG tablet Take 5 mg by mouth 2 (two) times daily.   Yes [provider]  zinc oxide 20 % ointment Apply 1 application topically 2 (two) times daily. Apply topically to buttocks twice daily for dermatitis   Yes [provider]  atenolol (TENORMIN) 25 MG tablet Take 75 mg by mouth daily.     [provider]  insulin detemir (LEVEMIR) 100 unit/ml SOLN Inject 0.16 mLs (16 Units total) into the skin daily. Patient not taking: Reported on 04/08/2019 01/04/15   Gladstone Lighter, MD  Nicotine 21-14-7 MG/24HR KIT Place 21 mg onto the skin daily. Patient not taking: Reported on 05/22/2015 01/05/15   Gladstone Lighter, MD  traZODone (DESYREL) 50 MG tablet Take 25 mg by mouth at bedtime.    [provider]  Vitamin D, Ergocalciferol, (DRISDOL) 50000 units CAPS capsule Take 50,000 Units by mouth every 7 (seven) days.    [provider]      VITAL SIGNS:  Blood pressure 129/66, pulse 68, temperature 98.2 F (36.8 C), temperature source Oral, resp. rate 20, height 5' 11"  (1.803 m), weight 77.1 kg, SpO2 100 %.  PHYSICAL EXAMINATION:  Physical Exam  GENERAL:  86 y.o.-year-old Caucasian female patient lying in the bed with no acute distress.  EYES: Pupils equal, round, reactive to light and accommodation. No  scleral icterus. Extraocular muscles intact.  HEENT: Head atraumatic, normocephalic. Oropharynx and nasopharynx clear.  NECK:  Supple, no jugular venous distention. No thyroid enlargement, no tenderness.  LUNGS: Normal breath sounds bilaterally, no wheezing, rales,rhonchi or crepitation. No use of accessory muscles of respiration.  CARDIOVASCULAR: Regular rate and rhythm, S1, S2 normal. No murmurs, rubs, or gallops.  ABDOMEN: Soft, nondistended, nontender. Bowel sounds present. No organomegaly or mass.  EXTREMITIES: No pedal edema, cyanosis, or clubbing. -Musculoskeletal: Right hip tenderness. NEUROLOGIC: Cranial nerves II through XII are intact. Muscle strength 5/5 in all extremities. Sensation intact. Gait not checked.  PSYCHIATRIC: The patient is alert and oriented x 3.  Normal affect and good eye contact. SKIN: No obvious rash, lesion, or ulcer.   LABORATORY PANEL:   CBC Recent Labs  Lab 04/08/19 0055  WBC 10.0  HGB 10.8*  HCT 35.0*  PLT 322   ------------------------------------------------------------------------------------------------------------------  Chemistries  Recent Labs  Lab 04/08/19 0055  NA 136  K 4.2  CL 99  CO2 26  GLUCOSE 75  BUN 22  CREATININE 0.76  CALCIUM 9.7  AST 25  ALT 18  ALKPHOS 201*  BILITOT 0.6   ------------------------------------------------------------------------------------------------------------------  Cardiac Enzymes No results for input(s): TROPONINI in the last 168 hours. ------------------------------------------------------------------------------------------------------------------  RADIOLOGY:  CT Head Wo Contrast  Result Date: 04/08/2019 CLINICAL DATA:  86 year old dementia patient post unwitnessed fall. EXAM: CT HEAD WITHOUT CONTRAST TECHNIQUE: Contiguous axial images were obtained from the base of the skull through the vertex without intravenous contrast. COMPARISON:  Head CT 08/29/2015 FINDINGS: Brain: No intracranial  hemorrhage, mass effect, or midline shift. Age related atrophy. No hydrocephalus. The basilar cisterns are patent. Moderate to advanced chronic small vessel ischemia. No evidence of territorial infarct or acute ischemia. No extra-axial or intracranial fluid collection. Vascular: Atherosclerosis of skullbase vasculature without hyperdense vessel or abnormal calcification. Skull: No fracture or focal lesion. Sinuses/Orbits: Paranasal sinuses and mastoid air cells are clear. The visualized orbits are unremarkable. Other: Small right parietal scalp hematoma. IMPRESSION: 1. Small right parietal scalp hematoma. No acute intracranial abnormality. No skull fracture. 2. Age related atrophy and chronic small vessel ischemia. Electronically Signed   By: Keith Rake M.D.   On: 04/08/2019 01:47   CT Cervical Spine Wo Contrast  Result Date: 04/08/2019 CLINICAL DATA:  86 year old dementia patient post unwitnessed fall. EXAM: CT CERVICAL SPINE WITHOUT CONTRAST TECHNIQUE: Multidetector CT imaging of the cervical spine was performed without intravenous contrast. Multiplanar CT image reconstructions were also generated. COMPARISON:  08/29/2015 FINDINGS: Alignment: Reversal of normal lordosis. Minimal anterolisthesis of C3 on C4 and C7 on T1, likely degenerative. No jumped or perched facets. Skull base and vertebrae: No acute fracture. Vertebral body heights are maintained. The dens and skull base are intact. Degenerative pannus at C1-C2. Soft tissues and spinal canal: No prevertebral fluid or swelling. No visible canal hematoma. Disc levels: Disc space narrowing and endplate spurring most prominent at C4-C5, C5-C6, and C6-C7. Multilevel facet hypertrophy. Degenerative changes are grossly stable from prior exam. Upper chest: Apical pleuroparenchymal scarring. No acute findings. Other: Carotid calcifications. IMPRESSION: Multilevel degenerative change throughout the cervical spine without acute fracture or subluxation.  Electronically Signed   By: Keith Rake M.D.   On: 04/08/2019 01:50   CT Hip Right Wo Contrast  Result Date: 04/08/2019 CLINICAL DATA:  Greater trochanter fracture, evaluate for intratrochanteric extension EXAM: CT OF THE RIGHT HIP WITHOUT CONTRAST TECHNIQUE: Multidetector CT imaging of the right hip was performed according to the standard protocol. Multiplanar CT image reconstructions were also generated. COMPARISON:  None. FINDINGS: Bones/Joint/Cartilage There is a extensively comminuted mildly displaced greater trochanter fracture. There are fracture fragments seen superiorly and posteriorly displaced around the hip. There also appears to be a probable incomplete nondisplaced fracture seen through the superior femoral neck best seen on series 4, image 49. No intratrochanteric extension is noted. The femoral head is still well seated within the acetabulum. There is moderate to advanced femoroacetabular joint osteoarthritis. Diffuse osteopenia is noted. A small hip joint effusion is seen. Ligaments Suboptimally assessed by CT. Muscles and Tendons Edema is noted within the gluteal musculature. The remainder of the muscles appear to be grossly intact. Heterogeneous appearance to the insertion site of the gluteal tendons, likely a partial disruption. The hamstrings tendons are intact. Soft tissues Subcutaneous edema and soft tissue swelling seen over the posterior lateral hip. There is a partially visualized right adnexal cystic mass measuring 4.0 x 2.9 by 3.6 cm. IMPRESSION: 1. Extensively comminuted mildly displaced greater trochanter fracture. No intertrochanteric extension. 2.  There is also a incomplete nondisplaced fracture seen through the superior femoral neck. 3. 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 4. Diffuse gluteal musculature edema with a probable partial disruption of the gluteal tendon insertion site 5. Partially visualized nonspecific cystic mass within the right adnexa  measuring 4.0 x 2.9 by 3.6 cm which could represent a lymphocele/peritoneal inclusion cyst or paraovarian cyst. If further evaluation is required would recommend CT abdomen pelvis with contrast when patient is stable. Electronically Signed   By: Prudencio Pair M.D.   On: 04/08/2019 03:44   DG Chest Port 1 View  Result Date: 04/08/2019 CLINICAL DATA:  Preop EXAM: PORTABLE CHEST 1 VIEW COMPARISON:  May 24, 2015 FINDINGS: The heart size and mediastinal contours are within normal limits. Aortic knob calcifications. Both lungs are clear. No acute osseous abnormality. IMPRESSION: No active disease. Electronically Signed   By: Prudencio Pair M.D.   On: 04/08/2019 02:42   DG Hip Unilat W or Wo Pelvis 2-3 Views Right  Result Date: 04/08/2019 CLINICAL DATA:  Pain after unwitnessed fall EXAM: DG HIP (WITH OR WITHOUT PELVIS) 2-3V RIGHT COMPARISON:  January 01, 2015 FINDINGS: There is a comminuted mildly displaced right greater trochanter fracture. There is diffuse osteopenia. No other fractures identified. Moderate bilateral hip osteoarthritis is seen with superior joint space loss and marginal osteophyte formation. Degenerative changes in the lower lumbar spine. Soft tissue swelling and subcutaneous edema overlying the right hip. IMPRESSION: Comminuted mildly displaced right greater trochanter fracture. Electronically Signed   By: Prudencio Pair M.D.   On: 04/08/2019 01:38      IMPRESSION AND PLAN:   1.  Right hip fracture secondary to mechanical fall. -The patient will be admitted to a surgical bed. -Pain management will be provided. -An orthopedic consultation will be obtained by Dr. Leim Fabry.  He was notified about the patient. -The patient has no history of CHF, coronary artery disease, CVA or renal failure.  She has a history of type diabetes mellitus on insulin.  She is above average risk for her age for perioperative cardiovascular events per the revised cardiac risk index.  2.  Type II diabetes  mellitus. -The patient will be placed on supplement coverage with NovoLog. -We will continue basal coverage will be cut down to half when she is n.p.o. -We will hold off Metformin and Glucotrol XL while the patient is n.p.o.  3.  Dyslipidemia. -We will continue statin therapy.  4.  Hypertension. -Continue Tenormin and lisinopril.  5.  DVT prophylaxis. -SCDs for now.  Medical prophylaxis is currently held off pending operative intervention.  All the records are reviewed and case discussed with ED provider. The plan of care was discussed in details with the patient (and family). I answered all questions. The patient agreed to proceed with the above mentioned plan. Further management will depend upon hospital course.   CODE STATUS: Full code    The patient is admitted to inpatient status due to the intensity of medical problems, potential risks involved and management requirement that will necessitate more than 2 midnights.   TOTAL TIME TAKING CARE OF THIS PATIENT: 50 minutes.    Christel Mormon M.D on 04/08/2019 at 5:18 AM  [facility]   From 7 PM-7 AM, contact night-coverage [email or web address]  CC: Primary care physician; Elba Barman, MD   Note: This dictation was prepared with Dragon dictation along with smaller phrase technology. Any transcriptional errors that result from this process are unintentional.

## 2019-04-08 NOTE — ED Triage Notes (Signed)
Pt with unwitnessed fall from [facility]. Pt with skin tear to right arm x2. Pt with history of dementia. Pt complains of right hip pain. Pt unsure of loc. Pt with hematoma noted to scalp.

## 2019-04-08 NOTE — Transfer of Care (Signed)
Immediate Anesthesia Transfer of Care Note  Patient: Jenna Robinson  Procedure(s) Performed: INTRAMEDULLARY (IM) NAIL INTERTROCHANTRIC (Right Hip)  Patient Location: PACU  Anesthesia Type:spinal  Level of Consciousness: awake, alert  and oriented  Airway & Oxygen Therapy: Patient Spontanous Breathing  Post-op Assessment: Report given to RN and Post -op Vital signs reviewed and stable  Post vital signs: Reviewed and stable  Last Vitals:  Vitals Value Taken Time  BP 116/47 04/08/19 1332  Temp 36.1 C 04/08/19 1331  Pulse    Resp 29 04/08/19 1333  SpO2 100 % 04/08/19 1331  Vitals shown include unvalidated device data.  Last Pain:  Vitals:   04/08/19 0726  TempSrc:   PainSc: 4          Complications: No apparent anesthesia complications

## 2019-04-08 NOTE — ED Notes (Signed)
[facility] sent to lab

## 2019-04-08 NOTE — ED Notes (Signed)
Per admission paperwork, Tammy M. Called to update on pt status [phone]  Per her, POA is [name] (phone # in chart). That is the preferred contact at this time

## 2019-04-08 NOTE — Op Note (Addendum)
DATE OF SURGERY: 04/08/2019  PREOPERATIVE DIAGNOSIS:  1. Right intertrochanteric hip fracture 2. Right femoral neck fracture 3. Right greater trochanter fracture  POSTOPERATIVE DIAGNOSIS: 1. Right intertrochanteric hip fracture 2. Right femoral neck fracture 3. Right greater trochanter fracture  PROCEDURE:  1. Intramedullary nailing of R femur with cephalomedullary device 2. Percutaneous fixation of R femoral neck 3. Open treatment of R greater trochanter fracture without fixation  SURGEON: Rosealee Albee, MD  ANESTHESIA: spinal  EBL: 100 cc  IVF: per anesthesia record  COMPONENTS:  AOS [employer] ES Long Nail: 11x360mm; lag screw; 77mm distal cortical interlocking screw; 22mm anti-rotation screw  INDICATIONS: Jenna Robinson is a 86 y.o. female who sustained a non-displaced intertrochanteric fracture and greater trochanter fracture after a fall. Risks and benefits of intramedullary nailing were explained to the patient's guardian (who is PoA). Risks include but are not limited to bleeding, infection, injury to tissues, nerves, vessels, nonunion/malunion, hardware failure, limb length discrepancy/hip rotation mismatch and risks of anesthesia. The patient and family understand these risks, have completed an informed consent, and wish to proceed.   PROCEDURE:  The patient was brought into the operating room. After administering anesthesia, the patient was placed in the supine position on the Hana table. The uninjured leg was placed in an extended position while the injured lower extremity was placed in longitudinal traction. The intertrochanteric fracture was confirmed to be non-displaced on AP and lateral fluoroscopic projections. Greater trochanteric fracture was visualized, and we elected not to perform any fixation of this fracture. The lateral aspect of the right hip and thigh were prepped with ChloraPrep solution before being draped sterilely. Preoperative IV antibiotics  were administered. A timeout was performed to verify the appropriate surgical site, patient, and procedure.    The greater trochanter was identified and an approximately 6 cm incision was made about 3 fingerbreadths above the tip of the greater trochanter. The incision was carried down through the subcutaneous tissues to expose the gluteal fascia. This was split the length of the incision, providing access to the tip of the trochanter. Under fluoroscopic guidance, a guidewire was drilled through the tip of the trochanter into the proximal metaphysis to the level of the lesser trochanter. This allowed for a partial reduction of the greater trochanter fracture fragment. After verifying its position fluoroscopically in AP and lateral projections, the guidewire was overreamed with the opening reamer to the level of the lesser trochanter. A guidewire was passed down through the femoral canal to the supracondylar region. The adequacy of guidewire position was verified fluoroscopically in AP and lateral projections before the length of the guidewire within the canal was measured and a nail of appropriate length was selected. The guidewire was overreamed sequentially using the flexible reamers. The appropriate sized nail was selected and advanced to the appropriate depth as verified fluoroscopically.    The guide system for the lag screw was positioned and advanced through an approximately 3cm stab incision over the lateral aspect of the proximal femur. The guidewire was drilled up through the femoral nail and into the femoral neck to rest within 5 mm of subchondral bone. Decision was made to place anti-rotation screw to stabilize non-displaced low femoral neck fracture. The guide for the anti-rotation screw was placed through the aiming arm and it was drilled to an appropriate depth. The appropriately sized anti-rotation screw was placed.   After verifying its position in the femoral neck and head in both AP and  lateral projections, the guidewire for  the lag screw was measured and appropriate sized lag screw was selected. The guidewire was overreamed to the appropriate depth before the lag screw was inserted and advanced to the appropriate depth as verified fluoroscopically in AP and lateral projections. The lag screw was advanced and the locking mechanism was deployed.    Attention was then turned to the distal interlocking screw in the diaphysis. Using a targeted assembly, a stab incision was made and hole was drilled through the nail. An interlocking screw was placed with excellent purchase. Again the adequacy of screw position was verified fluoroscopically in AP and lateral projections.   The wounds were irrigated thoroughly with sterile saline solution. Local anesthetic was injected into the wounds. Deep fascia and IT band was closed with 0-Vicryl. The subcutaneous tissues were closed using 2-0 Vicryl interrupted sutures. The skin was closed using staples. Sterile occlusive dressings were applied to all wounds. The patient was then transferred to the recovery room in satisfactory condition.   POSTOPERATIVE PLAN: The patient will be WBAT on the operative extremity. Lovenox 40mg /day x 4 weeks to start on POD#1. Perioperative IV antibiotics x 24 hours. PT/OT on POD#1.

## 2019-04-09 DIAGNOSIS — S72001D Fracture of unspecified part of neck of right femur, subsequent encounter for closed fracture with routine healing: Secondary | ICD-10-CM

## 2019-04-09 DIAGNOSIS — E118 Type 2 diabetes mellitus with unspecified complications: Secondary | ICD-10-CM

## 2019-04-09 DIAGNOSIS — E785 Hyperlipidemia, unspecified: Secondary | ICD-10-CM

## 2019-04-09 LAB — BASIC METABOLIC PANEL
Anion gap: 4 — ABNORMAL LOW (ref 5–15)
BUN: 17 mg/dL (ref 8–23)
CO2: 26 mmol/L (ref 22–32)
Calcium: 7.8 mg/dL — ABNORMAL LOW (ref 8.9–10.3)
Chloride: 104 mmol/L (ref 98–111)
Creatinine, Ser: 0.57 mg/dL (ref 0.44–1.00)
GFR calc Af Amer: >60 mL/min (ref >60)
GFR calc non Af Amer: >60 mL/min (ref >60)
Glucose, Bld: 180 mg/dL — ABNORMAL HIGH (ref 70–99)
Potassium: 4.1 mmol/L (ref 3.5–5.1)
Sodium: 134 mmol/L — ABNORMAL LOW (ref 135–145)

## 2019-04-09 LAB — CBC
HCT: 23.9 % — ABNORMAL LOW (ref 36.0–46.0)
Hemoglobin: 7.7 g/dL — ABNORMAL LOW (ref 12.0–15.0)
MCH: 31.6 pg (ref 26.0–34.0)
MCHC: 32.2 g/dL (ref 30.0–36.0)
MCV: 98 fL (ref 80.0–100.0)
Platelets: 188 10*3/uL (ref 150–400)
RBC: 2.44 MIL/uL — ABNORMAL LOW (ref 3.87–5.11)
RDW: 14.5 % (ref 11.5–15.5)
WBC: 9.7 10*3/uL (ref 4.0–10.5)
nRBC: 0 % (ref 0.0–0.2)

## 2019-04-09 LAB — GLUCOSE, CAPILLARY
Glucose-Capillary: 122 mg/dL — ABNORMAL HIGH (ref 70–99)
Glucose-Capillary: 258 mg/dL — ABNORMAL HIGH (ref 70–99)
Glucose-Capillary: 115 mg/dL — ABNORMAL HIGH (ref 70–99)

## 2019-04-09 LAB — URINE CULTURE: Special Requests: NORMAL

## 2019-04-09 LAB — HEMOGLOBIN A1C
Hgb A1c MFr Bld: 5.6 % (ref 4.8–5.6)
Mean Plasma Glucose: 114.02 mg/dL

## 2019-04-09 MED ORDER — INSULIN ASPART 100 UNIT/ML ~~LOC~~ SOLN
0.0000 [IU] | Freq: Three times a day (TID) | SUBCUTANEOUS | Status: DC
  Administered 2019-04-09: 2 [IU] via SUBCUTANEOUS
  Filled 2019-04-09: qty 1

## 2019-04-09 MED ORDER — INSULIN ASPART 100 UNIT/ML ~~LOC~~ SOLN
0.0000 [IU] | Freq: Three times a day (TID) | SUBCUTANEOUS | Status: DC
  Administered 2019-04-09: 8 [IU] via SUBCUTANEOUS
  Administered 2019-04-10: 3 [IU] via SUBCUTANEOUS
  Administered 2019-04-10: 2 [IU] via SUBCUTANEOUS
  Administered 2019-04-11: 3 [IU] via SUBCUTANEOUS
  Filled 2019-04-09 (×4): qty 1

## 2019-04-09 MED ORDER — INSULIN ASPART 100 UNIT/ML ~~LOC~~ SOLN
0.0000 [IU] | Freq: Every day | SUBCUTANEOUS | Status: DC
  Filled 2019-04-09: qty 1

## 2019-04-09 NOTE — Progress Notes (Signed)
  Subjective: 1 Day Post-Op Procedure(s) (LRB): INTRAMEDULLARY (IM) NAIL INTERTROCHANTRIC (Right) Patient reports pain as moderate.   Patient is well, and has had no acute complaints or problems Negative for chest pain and shortness of breath Fever: no Gastrointestinal: negative for nausea and vomiting.  Patient has not had a bowel movement.  Objective: Vital signs in last 24 hours: Temp:  [96.8 F (36 C)-98.3 F (36.8 C)] 98.3 F (36.8 C) (03/28 0845) Pulse Rate:  [56-100] 70 (03/28 0959) Resp:  [11-18] 16 (03/28 0959) BP: (97-138)/(46-67) 110/60 (03/28 0959) SpO2:  [69 %-100 %] 96 % (03/28 0845)  Intake/Output from previous day:  Intake/Output Summary (Last 24 hours) at 04/09/2019 1150 Last data filed at 04/09/2019 1020 Gross per 24 hour  Intake 2516.94 ml  Output 1550 ml  Net 966.94 ml    Intake/Output this shift: Total I/O In: 1163.8 [P.O.:120; I.V.:743.8; IV Piggyback:300] Out: -   Labs: Recent Labs    04/08/19 0055 04/09/19 0642  HGB 10.8* 7.7*   Recent Labs    04/08/19 0055 04/09/19 0642  WBC 10.0 9.7  RBC 3.58* 2.44*  HCT 35.0* 23.9*  PLT 322 188   Recent Labs    04/08/19 0055 04/09/19 0642  NA 136 134*  K 4.2 4.1  CL 99 104  CO2 26 26  BUN 22 17  CREATININE 0.76 0.57  GLUCOSE 75 180*  CALCIUM 9.7 7.8*   Recent Labs    04/08/19 0055  INR 1.0     EXAM General - Patient is Alert, Appropriate and Oriented Extremity - Neurovascular intact Dorsiflexion/Plantar flexion intact Compartment soft Dressing/Incision -mild sanguinous drainage noted from the proximal incision; minimal drainge noted from other two. Owen's gauze remains in palce Motor Function - intact, moving foot and toes well on exam.     Assessment/Plan: 1 Day Post-Op Procedure(s) (LRB): INTRAMEDULLARY (IM) NAIL INTERTROCHANTRIC (Right) Active Problems:   Closed right hip fracture (HCC)  Estimated body mass index is 23.71 kg/m as calculated from the following:   Height  as of this encounter: 5\' 11"  (1.803 m).   Weight as of this encounter: 77.1 kg. Advance diet Up with therapy  Proximal most honeycomb dressing changed.  DVT Prophylaxis - Lovenox Weight-Bearing as tolerated to right leg  , PA-C [facility] Orthopaedic Surgery 04/09/2019, 11:50 AM

## 2019-04-09 NOTE — Progress Notes (Addendum)
PROGRESS NOTE    Jenna Robinson  SWF:[id number] DOB: 03-May-1925 DOA: 04/08/2019 PCP: Elba Barman, MD    Brief Narrative: 86 y.o. Caucasian female with a known history of type diabetes mellitus, hypertension, dyslipidemia and Alzheimer's dementia, who presented to the emergency room with acute onset of recent recurrent falls.  She denied loss of consciousness, paresthesias or focal muscle weakness.  She stated that she got up from her couch and lost her balance before she fell.  No chest pain or dyspnea palpitations cough or wheezing or hemoptysis.  No dysuria, oliguria or hematuria or flank pain.  Upon presentation to the emergency room respiratory rate was 24 and otherwise vital signs within normal.  Labs revealed unremarkable CMP and mild anemia worse than previous levels.  COVID-19 PCR and influenza antigens came back negative.  Portable chest x-ray showed no acute cardiopulmonary disease.  Right hip CT showed extensive comminuted mildly displaced greater trochanteric fracture and an incomplete nondisplaced fracture through the superior femoral neck with moderate to advanced osteoarthritis and diffuse gluteal musculature edema with probable partial disruption of the gluteal tendon insertion site  3/28: Patient seen and examined.  Postoperative day #1 status post intramedullary nailing right femur, percutaneous fixation of right femoral neck, open treatment of right greater trochanter fracture without fixation.  Tolerated procedure well.  Pain well controlled postoperatively.  Physical therapy evaluated patient.  Recommended skilled nursing facility placement.  Patient on subcutaneous Lovenox 40 mg daily.  4-week course per orthopedic recs   Assessment & Plan:   Active Problems:   Closed right hip fracture (HCC)  Comminuted right hip fracture status post mechanical fall Patient underwent operative fixation on 04/08/2019 Tolerated procedure well, no reported  complications Orthopedic surgeon Dr. Leim Fabry Plan: Postoperative care Pain control Lovenox 40 mg subcutaneous daily x4 weeks PT and OT, recommending skilled nursing facility [facility] consult for placement  Type 2 diabetes mellitus Patient on Metformin and glipizide at home Documented 16 units of Levemir however reported the patient has not taken Plan: Defer long-acting insulin at this time Moderate sliding scale CBG before meals and at bedtime Carb modified diet Diabetes coordinator consult  Hyperlipidemia Continue home statin  Hypertension Continue home Tenormin Continue home lisinopril   DVT prophylaxis: Lovenox Code Status: Full Family Communication: Guardian [name] via phone 04/09/2019 Disposition Plan: Anticipate skilled nursing facility.  Patient currently postoperative day #1.  Pending postoperative evaluation orthopedic surgery.  Anticipate medical readiness for discharge on 04/10/2019   Consultants:   Orthopedic surgery-Patel  Procedures:   Right hip intramedullary nail 04/08/2019  Antimicrobials:   Perioperative Ancef   Subjective: Patient seen and examined No complaints, feels well Pain controlled  Objective: Vitals:   04/09/19 0536 04/09/19 0558 04/09/19 0845 04/09/19 0959  BP: (!) 97/46 (!) 97/49 (!) 102/57 110/60  Pulse: 78  83 70  Resp: 18  18 16   Temp: 98.2 F (36.8 C)  98.3 F (36.8 C)   TempSrc:   Oral   SpO2: 93%  96%   Weight:      Height:        Intake/Output Summary (Last 24 hours) at 04/09/2019 1115 Last data filed at 04/09/2019 1020 Gross per 24 hour  Intake 2516.94 ml  Output 1550 ml  Net 966.94 ml   Filed Weights   04/08/19 0044  Weight: 77.1 kg    Examination:  General exam: Appears calm and comfortable, appears stated age Respiratory system: Clear to auscultation. Respiratory effort normal. Cardiovascular system: S1 & S2  heard, RRR. No JVD, murmurs, rubs, gallops or clicks. No pedal edema. Gastrointestinal system:  Abdomen is nondistended, soft and nontender. No organomegaly or masses felt. Normal bowel sounds heard. Central nervous system: Alert and oriented. No focal neurological deficits. Extremities: Right lower extremity surgical dressings not removed Skin: No rashes, lesions or ulcers Psychiatry: Judgement and insight appear normal. Mood & affect appropriate.     Data Reviewed: I have personally reviewed following labs and imaging studies  CBC: Recent Labs  Lab 04/08/19 0055 04/09/19 0642  WBC 10.0 9.7  NEUTROABS 7.1  --   HGB 10.8* 7.7*  HCT 35.0* 23.9*  MCV 97.8 98.0  PLT 322 188   Basic Metabolic Panel: Recent Labs  Lab 04/08/19 0055 04/09/19 0642  NA 136 134*  K 4.2 4.1  CL 99 104  CO2 26 26  GLUCOSE 75 180*  BUN 22 17  CREATININE 0.76 0.57  CALCIUM 9.7 7.8*   GFR: Estimated Creatinine Clearance: 51.2 mL/min (by C-G formula based on SCr of 0.57 mg/dL). Liver Function Tests: Recent Labs  Lab 04/08/19 0055  AST 25  ALT 18  ALKPHOS 201*  BILITOT 0.6  PROT 6.9  ALBUMIN 3.8   No results for input(s): LIPASE, AMYLASE in the last 168 hours. No results for input(s): AMMONIA in the last 168 hours. Coagulation Profile: Recent Labs  Lab 04/08/19 0055  INR 1.0   Cardiac Enzymes: No results for input(s): CKTOTAL, CKMB, CKMBINDEX, TROPONINI in the last 168 hours. BNP (last 3 results) No results for input(s): PROBNP in the last 8760 hours. HbA1C: No results for input(s): HGBA1C in the last 72 hours. CBG: Recent Labs  Lab 04/08/19 1339 04/08/19 1411  GLUCAP 70 105*   Lipid Profile: No results for input(s): CHOL, HDL, LDLCALC, TRIG, CHOLHDL, LDLDIRECT in the last 72 hours. Thyroid Function Tests: Recent Labs    04/08/19 0713  TSH 1.826   Anemia Panel: No results for input(s): VITAMINB12, FOLATE, FERRITIN, TIBC, IRON, RETICCTPCT in the last 72 hours. Sepsis Labs: No results for input(s): PROCALCITON, LATICACIDVEN in the last 168 hours.  Recent Results  (from the past 240 hour(s))  Respiratory Panel by RT PCR (Flu A&B, Covid) - Nasopharyngeal Swab     Status: None   Collection Time: 04/08/19  2:35 AM   Specimen: Nasopharyngeal Swab  Result Value Ref Range Status   SARS Coronavirus 2 by RT PCR NEGATIVE NEGATIVE Final    Comment: (NOTE) SARS-CoV-2 target nucleic acids are NOT DETECTED. The SARS-CoV-2 RNA is generally detectable in upper respiratoy specimens during the acute phase of infection. The lowest concentration of SARS-CoV-2 viral copies this assay can detect is 131 copies/mL. A negative result does not preclude SARS-Cov-2 infection and should not be used as the sole basis for treatment or other patient management decisions. A negative result may occur with  improper specimen collection/handling, submission of specimen other than nasopharyngeal swab, presence of viral mutation(s) within the areas targeted by this assay, and inadequate number of viral copies (<131 copies/mL). A negative result must be combined with clinical observations, patient history, and epidemiological information. The expected result is Negative. Fact Sheet for Patients:  https://www.moore.com/ Fact Sheet for Healthcare Providers:  https://www.young.biz/ This test is not yet ap proved or cleared by the [place] FDA and  has been authorized for detection and/or diagnosis of SARS-CoV-2 by FDA under an Emergency Use Authorization (EUA). This EUA will remain  in effect (meaning this test can be used) for the duration of the COVID-19  declaration under Section 564(b)(1) of the Act, 21 U.S.C. section 360bbb-3(b)(1), unless the authorization is terminated or revoked sooner.    Influenza A by PCR NEGATIVE NEGATIVE Final   Influenza B by PCR NEGATIVE NEGATIVE Final    Comment: (NOTE) The Xpert Xpress SARS-CoV-2/FLU/RSV assay is intended as an aid in  the diagnosis of influenza from Nasopharyngeal swab specimens and   should not be used as a sole basis for treatment. Nasal washings and  aspirates are unacceptable for Xpert Xpress SARS-CoV-2/FLU/RSV  testing. Fact Sheet for Patients: https://www.moore.com/ Fact Sheet for Healthcare Providers: https://www.young.biz/ This test is not yet approved or cleared by the [place] FDA and  has been authorized for detection and/or diagnosis of SARS-CoV-2 by  FDA under an Emergency Use Authorization (EUA). This EUA will remain  in effect (meaning this test can be used) for the duration of the  Covid-19 declaration under Section 564(b)(1) of the Act, 21  U.S.C. section 360bbb-3(b)(1), unless the authorization is  terminated or revoked. Performed at [facility], [address], [place], [place] [address]   Surgical pcr screen     Status: Abnormal   Collection Time: 04/08/19  6:23 AM   Specimen: Nasal Mucosa; Nasal Swab  Result Value Ref Range Status   MRSA, PCR NEGATIVE NEGATIVE Final   Staphylococcus aureus POSITIVE (A) NEGATIVE Final    Comment: (NOTE) The Xpert SA Assay (FDA approved for NASAL specimens in patients 53 years of age and older), is one component of a comprehensive surveillance program. It is not intended to diagnose infection nor to guide or monitor treatment. Performed at [facility], [address], [place], [place] [address]          Radiology Studies: CT Head Wo Contrast  Result Date: 04/08/2019 CLINICAL DATA:  86 year old dementia patient post unwitnessed fall. EXAM: CT HEAD WITHOUT CONTRAST TECHNIQUE: Contiguous axial images were obtained from the base of the skull through the vertex without intravenous contrast. COMPARISON:  Head CT 08/29/2015 FINDINGS: Brain: No intracranial hemorrhage, mass effect, or midline shift. Age related atrophy. No hydrocephalus. The basilar cisterns are patent. Moderate to advanced chronic small vessel ischemia. No evidence of territorial  infarct or acute ischemia. No extra-axial or intracranial fluid collection. Vascular: Atherosclerosis of skullbase vasculature without hyperdense vessel or abnormal calcification. Skull: No fracture or focal lesion. Sinuses/Orbits: Paranasal sinuses and mastoid air cells are clear. The visualized orbits are unremarkable. Other: Small right parietal scalp hematoma. IMPRESSION: 1. Small right parietal scalp hematoma. No acute intracranial abnormality. No skull fracture. 2. Age related atrophy and chronic small vessel ischemia. Electronically Signed   By: Narda Rutherford M.D.   On: 04/08/2019 01:47   CT Cervical Spine Wo Contrast  Result Date: 04/08/2019 CLINICAL DATA:  86 year old dementia patient post unwitnessed fall. EXAM: CT CERVICAL SPINE WITHOUT CONTRAST TECHNIQUE: Multidetector CT imaging of the cervical spine was performed without intravenous contrast. Multiplanar CT image reconstructions were also generated. COMPARISON:  08/29/2015 FINDINGS: Alignment: Reversal of normal lordosis. Minimal anterolisthesis of C3 on C4 and C7 on T1, likely degenerative. No jumped or perched facets. Skull base and vertebrae: No acute fracture. Vertebral body heights are maintained. The dens and skull base are intact. Degenerative pannus at C1-C2. Soft tissues and spinal canal: No prevertebral fluid or swelling. No visible canal hematoma. Disc levels: Disc space narrowing and endplate spurring most prominent at C4-C5, C5-C6, and C6-C7. Multilevel facet hypertrophy. Degenerative changes are grossly stable from prior exam. Upper chest: Apical pleuroparenchymal scarring. No  acute findings. Other: Carotid calcifications. IMPRESSION: Multilevel degenerative change throughout the cervical spine without acute fracture or subluxation. Electronically Signed   By: Narda Rutherford M.D.   On: 04/08/2019 01:50   CT Hip Right Wo Contrast  Result Date: 04/08/2019 CLINICAL DATA:  Greater trochanter fracture, evaluate for  intratrochanteric extension EXAM: CT OF THE RIGHT HIP WITHOUT CONTRAST TECHNIQUE: Multidetector CT imaging of the right hip was performed according to the standard protocol. Multiplanar CT image reconstructions were also generated. COMPARISON:  None. FINDINGS: Bones/Joint/Cartilage There is a extensively comminuted mildly displaced greater trochanter fracture. There are fracture fragments seen superiorly and posteriorly displaced around the hip. There also appears to be a probable incomplete nondisplaced fracture seen through the superior femoral neck best seen on series 4, image 49. No intratrochanteric extension is noted. The femoral head is still well seated within the acetabulum. There is moderate to advanced femoroacetabular joint osteoarthritis. Diffuse osteopenia is noted. A small hip joint effusion is seen. Ligaments Suboptimally assessed by CT. Muscles and Tendons Edema is noted within the gluteal musculature. The remainder of the muscles appear to be grossly intact. Heterogeneous appearance to the insertion site of the gluteal tendons, likely a partial disruption. The hamstrings tendons are intact. Soft tissues Subcutaneous edema and soft tissue swelling seen over the posterior lateral hip. There is a partially visualized right adnexal cystic mass measuring 4.0 x 2.9 by 3.6 cm. IMPRESSION: 1. Extensively comminuted mildly displaced greater trochanter fracture. No intertrochanteric extension. 2. There is also a incomplete nondisplaced fracture seen through the superior femoral neck. 3. 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 4. Diffuse gluteal musculature edema with a probable partial disruption of the gluteal tendon insertion site 5. Partially visualized nonspecific cystic mass within the right adnexa measuring 4.0 x 2.9 by 3.6 cm which could represent a lymphocele/peritoneal inclusion cyst or paraovarian cyst. If further evaluation is required would recommend CT abdomen pelvis with  contrast when patient is stable. Electronically Signed   By: Jonna Clark M.D.   On: 04/08/2019 03:44   DG Chest Port 1 View  Result Date: 04/08/2019 CLINICAL DATA:  Preop EXAM: PORTABLE CHEST 1 VIEW COMPARISON:  May 24, 2015 FINDINGS: The heart size and mediastinal contours are within normal limits. Aortic knob calcifications. Both lungs are clear. No acute osseous abnormality. IMPRESSION: No active disease. Electronically Signed   By: Jonna Clark M.D.   On: 04/08/2019 02:42   DG HIP OPERATIVE UNILAT W OR W/O PELVIS RIGHT  Result Date: 04/08/2019 CLINICAL DATA:  ORIF right greater trochanter fracture EXAM: OPERATIVE RIGHT HIP WITH PELVIS COMPARISON:  04/08/2019 right hip radiographs FLUOROSCOPY TIME:  Fluoroscopy Time:  0 minutes 58 seconds Number of Acquired Spot Images: 4 FINDINGS: Multiple nondiagnostic spot fluoroscopic intraoperative right hip radiographs demonstrate 2 pins in the right femoral neck with interlocking intramedullary rod in the proximal right femoral shaft with distal interlocking screw. IMPRESSION: Intraoperative fluoroscopic guidance for ORIF right greater trochanter fracture. Electronically Signed   By: Delbert Phenix M.D.   On: 04/08/2019 14:59   DG Hip Unilat W or Wo Pelvis 2-3 Views Right  Result Date: 04/08/2019 CLINICAL DATA:  Pain after unwitnessed fall EXAM: DG HIP (WITH OR WITHOUT PELVIS) 2-3V RIGHT COMPARISON:  January 01, 2015 FINDINGS: There is a comminuted mildly displaced right greater trochanter fracture. There is diffuse osteopenia. No other fractures identified. Moderate bilateral hip osteoarthritis is seen with superior joint space loss and marginal osteophyte formation. Degenerative changes in the lower lumbar spine. Soft tissue  swelling and subcutaneous edema overlying the right hip. IMPRESSION: Comminuted mildly displaced right greater trochanter fracture. Electronically Signed   By: Jonna ClarkBindu  Avutu M.D.   On: 04/08/2019 01:38        Scheduled Meds:   acetaminophen  1,000 mg Oral Q8H   atorvastatin  20 mg Oral Daily   cholecalciferol  2,000 Units Oral Daily   [START ON 04/10/2019] cinacalcet  30 mg Oral Q M,W,F   docusate sodium  100 mg Oral BID   enoxaparin (LOVENOX) injection  40 mg Subcutaneous Q24H   lisinopril  10 mg Oral Daily   melatonin  5 mg Oral QHS   oxybutynin  5 mg Oral BID   Continuous Infusions:  methocarbamol (ROBAXIN) IV       LOS: 1 day    Time spent: 35 minutes    Tresa MooreSudheer B Kylyn Mcdade, MD [facility] Pager [phone]  If 7PM-7AM, please contact night-coverage 04/09/2019, 11:15 AM

## 2019-04-09 NOTE — Anesthesia Postprocedure Evaluation (Signed)
Anesthesia Post Note  Patient: Jenna Robinson  Procedure(s) Performed: INTRAMEDULLARY (IM) NAIL INTERTROCHANTRIC (Right Hip)  Patient location during evaluation: Nursing Unit Anesthesia Type: Spinal Level of consciousness: awake and alert Pain management: pain level controlled Vital Signs Assessment: post-procedure vital signs reviewed and stable Respiratory status: spontaneous breathing, nonlabored ventilation and respiratory function stable Cardiovascular status: blood pressure returned to baseline and stable Postop Assessment: no apparent nausea or vomiting, no headache, no backache, patient able to bend at knees and adequate PO intake Anesthetic complications: no     Last Vitals:  Vitals:   04/09/19 0959 04/09/19 1400  BP: 110/60 119/62  Pulse: 70 80  Resp: 16   Temp:    SpO2:  93%    Last Pain:  Vitals:   04/09/19 1054  TempSrc:   PainSc: 8                  Lenard Simmer

## 2019-04-09 NOTE — Evaluation (Signed)
Occupational Therapy Evaluation Patient Details Name: Jenna Robinson MRN: [id number] DOB: May 13, 1925 Today's Date: 04/09/2019    History of Present Illness Per MD note:Jenna Robinson  is a 86 y.o. Caucasian female with a known history of type diabetes mellitus, hypertension, dyslipidemia and Alzheimer's dementia, who presented to the emergency room with acute onset of recent recurrent falls.  She denied loss of consciousness, paresthesias or focal muscle weakness.  She stated that she got up from her couch and lost her balance before she fell. Pt is s/p Intramedullary nailing of R femur with cephalomedullary device   Clinical Impression   Jenna Robinson was seen for OT evaluation this date. Pt unable to provide home setup or PLOF at this time. Pt presents to acute OT demonstrating impaired ADL performance, functional cognition, and functional mobility 2/2 decreased safety awareness, pain, and functional strength/ROM deficits. Pt currently requires SUP + MAX VCs for sequencing for L+R rolling to reposition for pain management. Pt deferred OOB mobility 2/2 pain this date. Pt would benefit from skilled OT to address noted impairments and functional limitations (see below for any additional details) in order to maximize safety and independence while minimizing falls risk and caregiver burden. Upon hospital discharge, recommend STR to maximize pt safety and return to PLOF.     Follow Up Recommendations  SNF    Equipment Recommendations       Recommendations for Other Services       Precautions / Restrictions Precautions Precautions: Fall Restrictions Weight Bearing Restrictions: Yes RLE Weight Bearing: Weight bearing as tolerated      Mobility Bed Mobility Overal bed mobility: Needs Assistance Bed Mobility: Rolling Rolling: Supervision(VCs for sequencing)     General bed mobility comments: Pt required MAX VCs forsequencing rolling L+R and bridging to scoot higher in bed.    Transfers Overall transfer level: Needs assistance         General transfer comment: Pt reporting RLE pain (not quantified) and desires to rest    Balance Overall balance assessment: Needs assistance                              ADL either performed or assessed with clinical judgement   ADL Overall ADL's : Needs assistance/impaired                                       General ADL Comments: SETUP + VCs for self-feeding and grooming long sitting in bed     Vision         Perception     Praxis      Pertinent Vitals/Pain Pain Assessment: Faces Pain Score:   Faces Pain Scale: Hurts even more Pain Location: R hip Pain Descriptors / Indicators: Aching;Guarding;Moaning Pain Intervention(s): Limited activity within patient's tolerance;Repositioned     Hand Dominance     Extremity/Trunk Assessment Upper Extremity Assessment Upper Extremity Assessment: Overall WFL for tasks assessed(B grip 4/5)   Lower Extremity Assessment Lower Extremity Assessment: RLE deficits/detail (LLE AROM WFL grossly) RLE Deficits / Details:  RLE: Unable to fully assess due to pain(RLE AROM limited compared to LLE, not formally tested)       Communication Communication Communication: No difficulties   Cognition Arousal/Alertness: Awake/alert Behavior During Therapy: WFL for tasks assessed/performed Overall Cognitive Status: No family/caregiver present to determine baseline cognitive functioning(Hx of dementia)  General Comments: Pt unable to state home setup repeatedly saying "I can't remember." Able to follow one step commands with increased time and repetition   General Comments       Exercises Other Exercises: repositioning for comfort (L+R rolling and brisge to scoot higher in bed), meal setup in preparation for self-feeding   Shoulder Instructions      Home Living Family/patient expects to be  discharged to:: Skilled nursing facility                                        Prior Functioning/Environment Level of Independence: Independent with assistive device(s)                 OT Problem List: Decreased strength;Decreased range of motion;Decreased activity tolerance;Decreased safety awareness;Decreased cognition;Decreased knowledge of use of DME or AE;Pain      OT Treatment/Interventions: Self-care/ADL training;Therapeutic exercise;Neuromuscular education;Energy conservation;DME and/or AE instruction;Therapeutic activities;Patient/family education;Balance training    OT Goals(Current goals can be found in the care plan section) Acute Rehab OT Goals Patient Stated Goal: to walk OT Goal Formulation: With patient Time For Goal Achievement: 04/23/19 Potential to Achieve Goals: Fair ADL Goals Pt Will Perform Grooming: with supervision;standing(c LRAD PRN and no cues for seated rest breaks) Pt Will Perform Lower Body Dressing: with min guard assist;with caregiver independent in assisting;sit to/from stand(c LRAD & AE PRN) Pt Will Transfer to Toilet: with supervision;stand pivot transfer;bedside commode(c LRAD PRN)  OT Frequency: Min 2X/week   Barriers to D/C: Inaccessible home environment;Decreased caregiver support          Co-evaluation              AM-PAC OT "6 Clicks" Daily Activity     Outcome Measure Help from another person eating meals?: A Little Help from another person taking care of personal grooming?: A Little Help from another person toileting, which includes using toliet, bedpan, or urinal?: A Lot Help from another person bathing (including washing, rinsing, drying)?: A Lot Help from another person to put on and taking off regular upper body clothing?: A Little Help from another person to put on and taking off regular lower body clothing?: A Lot 6 Click Score: 15   End of Session    Activity Tolerance: Patient limited by  pain Patient left: in bed;with call bell/phone within reach;with bed alarm set  OT Visit Diagnosis: Unsteadiness on feet (R26.81);Other abnormalities of gait and mobility (R26.89)                Time: 2924-4628 OT Time Calculation (min): 9 min Charges:  OT General Charges $OT Visit: 1 Visit OT Evaluation $OT Eval Low Complexity: 1 Low  Kathie Dike, M.S. OTR/L  04/09/19, 1:52 PM

## 2019-04-09 NOTE — Progress Notes (Signed)
Inpatient Diabetes Program Recommendations  AACE/ADA: New Consensus Statement on Inpatient Glycemic Control (2015)  Target Ranges:  Prepandial:   less than 140 mg/dL      Peak postprandial:   less than 180 mg/dL (1-2 hours)      Critically ill patients:  140 - 180 mg/dL   Lab Results  Component Value Date   GLUCAP 122 (H) 04/09/2019   HGBA1C 6.5 (H) 05/23/2015    Review of Glycemic Control  Diabetes history: DM2 Outpatient Diabetes medications: Glumetza 500 mg QD, glipizide 2.5 mg QAM Current orders for Inpatient glycemic control: Novolog 0-15 units tidwc and hs  Inpatient Diabetes Program Recommendations:     Decrease Novolog to 0-9 units tidwc and hs.  Will follow.  Thank you. Ailene Ards, RD, LDN, CDE Inpatient Diabetes Coordinator [phone]

## 2019-04-09 NOTE — Progress Notes (Signed)
Physical Therapy Evaluation Patient Details Name: Jenna Robinson MRN: [id number] DOB: [birth year] Today's Date: 04/09/2019   History of Present Illness  Per MD note:Shizuko Giarrusso  is a 86 y.o. Caucasian female with a known history of type diabetes mellitus, hypertension, dyslipidemia and Alzheimer's dementia, who presented to the emergency room with acute onset of recent recurrent falls.  She denied loss of consciousness, paresthesias or focal muscle weakness.  She stated that she got up from her couch and lost her balance before she fell. Pt is s/p Intramedullary nailing of R femur with cephalomedullary device  Clinical Impression  Patient is alert and orientated and agrees to PT evaluation. She has poor strength RLE and fair strength LLE. She reports 6/10 pain to right hip. She needs min assist for supine<> sit bed mobility and min assist for sit to stand transfer with RW. She gets fatigued with standing and is not able to take any steps today. She has good sitting and fair standing balance. She will benefit from skilled PT to improve mobility and strength.    Follow Up Recommendations SNF    Equipment Recommendations  None recommended by PT    Recommendations for Other Services       Precautions / Restrictions Restrictions Weight Bearing Restrictions: Yes RLE Weight Bearing: Weight bearing as tolerated      Mobility  Bed Mobility Overal bed mobility: Needs Assistance Bed Mobility: Supine to Sit;Sit to Supine     Supine to sit: Min assist Sit to supine: Min assist   General bed mobility comments: cues for safety  Transfers Overall transfer level: Needs assistance Equipment used: Rolling walker (2 wheeled) Transfers: Sit to/from Stand Sit to Stand: Min assist         General transfer comment: (cues for UE placement)  Ambulation/Gait Ambulation/Gait assistance: (unable due to fatigue and weakness)              Stairs            Wheelchair  Mobility    Modified Rankin (Stroke Patients Only)       Balance Overall balance assessment: Needs assistance Sitting-balance support: Bilateral upper extremity supported;No upper extremity supported Sitting balance-Leahy Scale: Good     Standing balance support: Bilateral upper extremity supported Standing balance-Leahy Scale: Fair                               Pertinent Vitals/Pain Pain Assessment: 0-10 Pain Score: 6  Pain Location: (right hip) Pain Descriptors / Indicators: Aching Pain Intervention(s): Limited activity within patient's tolerance;Monitored during session    Home Living Family/patient expects to be discharged to:: Skilled nursing facility                      Prior Function Level of Independence: Independent with assistive device(s)               Hand Dominance        Extremity/Trunk Assessment   Upper Extremity Assessment Upper Extremity Assessment: Overall WFL for tasks assessed    Lower Extremity Assessment Lower Extremity Assessment: RLE deficits/detail RLE Deficits / Details: (2/5 hip flex/abd, knee extension 3/5)       Communication   Communication: No difficulties  Cognition Arousal/Alertness: Awake/alert Behavior During Therapy: WFL for tasks assessed/performed Overall Cognitive Status: Within Functional Limits for tasks assessed  General Comments      Exercises General Exercises - Lower Extremity Ankle Circles/Pumps: 10 reps Short Arc Quad: 10 reps Long Arc Quad: 10 reps Heel Slides: 10 reps Hip ABduction/ADduction: 10 reps   Assessment/Plan    PT Assessment Patient needs continued PT services  PT Problem List Decreased strength;Decreased range of motion;Decreased activity tolerance;Decreased balance;Decreased mobility       PT Treatment Interventions Gait training;Functional mobility training;Therapeutic activities;Therapeutic  exercise;Balance training    PT Goals (Current goals can be found in the Care Plan section)  Acute Rehab PT Goals Patient Stated Goal: to walk PT Goal Formulation: Patient unable to participate in goal setting Time For Goal Achievement: 04/23/19 Potential to Achieve Goals: Good    Frequency Min 2X/week   Barriers to discharge        Co-evaluation               AM-PAC PT "6 Clicks" Mobility  Outcome Measure Help needed turning from your back to your side while in a flat bed without using bedrails?: A Little Help needed moving from lying on your back to sitting on the side of a flat bed without using bedrails?: A Little Help needed moving to and from a bed to a chair (including a wheelchair)?: A Lot Help needed standing up from a chair using your arms (e.g., wheelchair or bedside chair)?: A Lot Help needed to walk in hospital room?: A Lot Help needed climbing 3-5 steps with a railing? : Total 6 Click Score: 13    End of Session Equipment Utilized During Treatment: Gait belt;Oxygen Activity Tolerance: Patient limited by fatigue;Patient limited by lethargy Patient left: in bed;with bed alarm set Nurse Communication: Mobility status PT Visit Diagnosis: Unsteadiness on feet (R26.81);Muscle weakness (generalized) (M62.81);Difficulty in walking, not elsewhere classified (R26.2)    Time: 1000-1025 PT Time Calculation (min) (ACUTE ONLY): 25 min   Charges:   PT Evaluation $PT Eval Low Complexity: 1 Low PT Treatments $Therapeutic Exercise: 8-22 mins         Jones Broom S, PT DPT 04/09/2019, 11:08 AM

## 2019-04-10 ENCOUNTER — Inpatient Hospital Stay: Payer: Medicare Other

## 2019-04-10 LAB — BASIC METABOLIC PANEL
Anion gap: 8 (ref 5–15)
BUN: 11 mg/dL (ref 8–23)
CO2: 24 mmol/L (ref 22–32)
Calcium: 8 mg/dL — ABNORMAL LOW (ref 8.9–10.3)
Chloride: 98 mmol/L (ref 98–111)
Creatinine, Ser: 0.5 mg/dL (ref 0.44–1.00)
GFR calc Af Amer: >60 mL/min (ref >60)
GFR calc non Af Amer: >60 mL/min (ref >60)
Glucose, Bld: 144 mg/dL — ABNORMAL HIGH (ref 70–99)
Potassium: 4.2 mmol/L (ref 3.5–5.1)
Sodium: 130 mmol/L — ABNORMAL LOW (ref 135–145)

## 2019-04-10 LAB — GLUCOSE, CAPILLARY
Glucose-Capillary: 129 mg/dL — ABNORMAL HIGH (ref 70–99)
Glucose-Capillary: 91 mg/dL (ref 70–99)
Glucose-Capillary: 139 mg/dL — ABNORMAL HIGH (ref 70–99)
Glucose-Capillary: 102 mg/dL — ABNORMAL HIGH (ref 70–99)

## 2019-04-10 LAB — CBC
HCT: 24.7 % — ABNORMAL LOW (ref 36.0–46.0)
Hemoglobin: 8.2 g/dL — ABNORMAL LOW (ref 12.0–15.0)
MCH: 30.9 pg (ref 26.0–34.0)
MCHC: 33.2 g/dL (ref 30.0–36.0)
MCV: 93.2 fL (ref 80.0–100.0)
Platelets: 198 10*3/uL (ref 150–400)
RBC: 2.65 MIL/uL — ABNORMAL LOW (ref 3.87–5.11)
RDW: 14.6 % (ref 11.5–15.5)
WBC: 9.8 10*3/uL (ref 4.0–10.5)
nRBC: 0 % (ref 0.0–0.2)

## 2019-04-10 MED ORDER — SENNOSIDES-DOCUSATE SODIUM 8.6-50 MG PO TABS
1.0000 | ORAL_TABLET | Freq: Two times a day (BID) | ORAL | Status: DC
  Administered 2019-04-10 – 2019-04-11 (×3): 1 via ORAL
  Filled 2019-04-10 (×2): qty 1

## 2019-04-10 MED ORDER — TRAMADOL HCL 50 MG PO TABS: 50.0000 mg | ORAL_TABLET | Freq: Four times a day (QID) | ORAL | 0 refills | Status: DC | PRN

## 2019-04-10 MED ORDER — ENOXAPARIN SODIUM 40 MG/0.4ML ~~LOC~~ SOLN: 40.0000 mg | SUBCUTANEOUS | 0 refills | Status: DC

## 2019-04-10 MED ORDER — OXYCODONE HCL 5 MG PO TABS: 2.5000 mg | ORAL_TABLET | ORAL | 0 refills | Status: DC | PRN

## 2019-04-10 MED ORDER — ENSURE ENLIVE PO LIQD
237.0000 mL | Freq: Two times a day (BID) | ORAL | Status: DC
  Administered 2019-04-10 – 2019-04-11 (×2): 237 mL via ORAL

## 2019-04-10 MED ORDER — LIDOCAINE 5 % EX PTCH
1.0000 | MEDICATED_PATCH | CUTANEOUS | Status: DC
  Administered 2019-04-10 – 2019-04-11 (×2): 1 via TRANSDERMAL
  Filled 2019-04-10 (×2): qty 1

## 2019-04-10 NOTE — Progress Notes (Signed)
Physical Therapy Treatment Patient Details Name: Jenna Robinson MRN: [id number] DOB: 07/15/1925 Today's Date: 04/10/2019    History of Present Illness Jenna Robinson is a 86 y.o. Caucasian female with a known history of type diabetes mellitus, hypertension, dyslipidemia and Alzheimer's dementia, who presented to the emergency room with acute onset of recent recurrent falls.  She denied loss of consciousness, paresthesias or focal muscle weakness.  She stated that she got up from her couch and lost her balance before she fell. Pt is s/p Intramedullary nailing of R femur with cephalomedullary device    PT Comments    Pt in recliner upon entry, lunch tray presented, pt reports finished eating, appears as though >50% of food remains on plate. Pt agreeable to PT session. Author assisted with new socks, pericare due to BM incontinence in chair, then donning of diaper for mobility. Pt follows cues well for rolling and bridging several times in this process, but is somewhat weak and clearly pain limited. Pt able to perform STS 2x from recliner with heavy modA, cues for hand transition. Pt able to AMB 97ft with RW, slow, 3-part antalgic gait with heavy UE support on RW. Pt received on [place] O2, but sats well throughout PT session on room air 93-95%. Pt assisted back to supine, RN in room aware of need for additional pericare cleanup and purewick placement.   Follow Up Recommendations  SNF     Equipment Recommendations  None recommended by PT    Recommendations for Other Services       Precautions / Restrictions Precautions Precautions: Fall Restrictions Weight Bearing Restrictions: Yes RLE Weight Bearing: Weight bearing as tolerated    Mobility  Bed Mobility Overal bed mobility: Needs Assistance Bed Mobility: Sit to Supine Rolling: Max assist;+2 for safety/equipment   Supine to sit: Min assist;HOB elevated(Asssit for RLE and VCs for sequencing) Sit to supine: Max assist(has pain with  attempt, but is able to assist quite a bit)   General bed mobility comments: in chair at entry  Transfers Overall transfer level: Needs assistance Equipment used: Rolling walker (2 wheeled) Transfers: Sit to/from Stand Sit to Stand: Mod assist Stand pivot transfers: Mod assist       General transfer comment: twice in session, follows cues fore safety very well.  Ambulation/Gait Ambulation/Gait assistance: Min guard Gait Distance (Feet): 15 Feet Assistive device: Rolling walker (2 wheeled) Gait Pattern/deviations: Step-to pattern     General Gait Details: 3-point step-to antalgic gait; able to make it to door, but exhausted. Performed with chair follow and diaper.   Stairs             Wheelchair Mobility    Modified Rankin (Stroke Patients Only)       Balance Overall balance assessment: Needs assistance Sitting-balance support: Feet supported;Single extremity supported Sitting balance-Leahy Scale: Good     Standing balance support: Bilateral upper extremity supported;During functional activity Standing balance-Leahy Scale: Poor Standing balance comment: difficulty getting fully up, but with assistance is eventually able to be fully upright. much more stable.                            Cognition Arousal/Alertness: Awake/alert Behavior During Therapy: WFL for tasks assessed/performed Overall Cognitive Status: History of cognitive impairments - at baseline  General Comments: Pt has advanced dementia. Unable to provide any reliable information; is participatory, follows simple commands well      Exercises General Exercises - Lower Extremity Ankle Circles/Pumps: 15 reps;Supine;AAROM;Both Long Arc Quad: 10 reps;Seated;AAROM;Both Heel Slides: 10 reps;AAROM;Seated;Both Hip ABduction/ADduction: 10 reps;AAROM;Seated;Both Other Exercises Other Exercises: Toileting, hygiene, don/doff gown, don/doff B socks,  bed mobility, sup>sit, sit<>stand x3, SPT    General Comments        Pertinent Vitals/Pain Pain Assessment: No/denies pain Faces Pain Scale: Hurts even more Pain Location: R hip with bed mobility Pain Descriptors / Indicators: Aching;Guarding Pain Intervention(s): Limited activity within patient's tolerance;Monitored during session;Premedicated before session;Repositioned    Home Living                      Prior Function            PT Goals (current goals can now be found in the care plan section) Acute Rehab PT Goals Patient Stated Goal: to walk PT Goal Formulation: Patient unable to participate in goal setting Time For Goal Achievement: 04/23/19 Potential to Achieve Goals: Fair Progress towards PT goals: Progressing toward goals    Frequency    BID      PT Plan Current plan remains appropriate    Co-evaluation              AM-PAC PT "6 Clicks" Mobility   Outcome Measure  Help needed turning from your back to your side while in a flat bed without using bedrails?: A Lot Help needed moving from lying on your back to sitting on the side of a flat bed without using bedrails?: A Lot Help needed moving to and from a bed to a chair (including a wheelchair)?: A Lot Help needed standing up from a chair using your arms (e.g., wheelchair or bedside chair)?: A Lot Help needed to walk in hospital room?: A Little Help needed climbing 3-5 steps with a railing? : A Lot 6 Click Score: 13    End of Session Equipment Utilized During Treatment: Gait belt Activity Tolerance: Patient limited by fatigue;Patient tolerated treatment well Patient left: in bed;with nursing/sitter in room;with call bell/phone within reach(SCD are wet;) Nurse Communication: Mobility status PT Visit Diagnosis: Unsteadiness on feet (R26.81);Muscle weakness (generalized) (M62.81);Difficulty in walking, not elsewhere classified (R26.2)     Time: 8416-6063 PT Time Calculation (min) (ACUTE  ONLY): 29 min  Charges:  $Therapeutic Exercise: 23-37 mins                     1:44 PM, 04/10/19 Etta Grandchild, PT, DPT Physical Therapist - [facility]  [phone] ([place])    Hanne Kegg C 04/10/2019, 1:40 PM

## 2019-04-10 NOTE — Progress Notes (Signed)
PROGRESS NOTE    Jenna Robinson  MWN:[id number] DOB: [birth year] DOA: 04/08/2019 PCP: Mickel Fuchs, MD    Brief Narrative: 86 y.o. Caucasian female with a known history of type diabetes mellitus, hypertension, dyslipidemia and Alzheimer's dementia, who presented to the emergency room with acute onset of recent recurrent falls.  She denied loss of consciousness, paresthesias or focal muscle weakness.  She stated that she got up from her couch and lost her balance before she fell.  No chest pain or dyspnea palpitations cough or wheezing or hemoptysis.  No dysuria, oliguria or hematuria or flank pain.  Upon presentation to the emergency room respiratory rate was 24 and otherwise vital signs within normal.  Labs revealed unremarkable CMP and mild anemia worse than previous levels.  COVID-19 PCR and influenza antigens came back negative.  Portable chest x-ray showed no acute cardiopulmonary disease.  Right hip CT showed extensive comminuted mildly displaced greater trochanteric fracture and an incomplete nondisplaced fracture through the superior femoral neck with moderate to advanced osteoarthritis and diffuse gluteal musculature edema with probable partial disruption of the gluteal tendon insertion site  3/28: Patient seen and examined.  Postoperative day #1 status post intramedullary nailing right femur, percutaneous fixation of right femoral neck, open treatment of right greater trochanter fracture without fixation.  Tolerated procedure well.  Pain well controlled postoperatively.  Physical therapy evaluated patient.  Recommended skilled nursing facility placement.  Patient on subcutaneous Lovenox 40 mg daily.  4-week course per orthopedic recs  3/29: Patient seen and examined.  Postoperative day 2 status post intramedullary nail right femur.  Little bit more pain than yesterday.  Physical therapy recommending skilled nursing facility.  TOC team involved and bed search initiated.  Patient  remains on Lovenox 40 mg subcu daily.  Still has not had a bowel movement.   Assessment & Plan:   Active Problems:   Closed right hip fracture (HCC)  Comminuted right hip fracture status post mechanical fall Patient underwent operative fixation on 04/08/2019 Tolerated procedure well, no reported complications Orthopedic surgeon Dr. Signa Kell Plan: Postoperative care Pain control Lovenox 40 mg subcutaneous daily x4 weeks PT and OT, recommending skilled nursing facility [facility] consult for placement Bowel regimen.  Needs BM before discharge  Type 2 diabetes mellitus Patient on Metformin and glipizide at home Documented 16 units of Levemir however reported the patient has not taken Plan: Defer long-acting insulin at this time Moderate sliding scale CBG before meals and at bedtime Carb modified diet Diabetes coordinator consult  Hyperlipidemia Continue home statin  Hypertension Continue home Tenormin Continue home lisinopril   DVT prophylaxis: Lovenox Code Status: Full Family Communication: Guardian [name] left VM [phone] on 04/10/2019 Disposition Plan: Anticipate skilled nursing facility.  Patient currently postoperative day #2.  Okay for discharge from medicine standpoint.  Currently pending facility acceptance and BM.  Consultants:   Orthopedic surgery-[facility]  Procedures:   Right hip intramedullary nail 04/08/2019  Antimicrobials:   Perioperative Ancef   Subjective: Patient seen and examined No complaints, feels well Pain controlled  Objective: Vitals:   04/09/19 2007 04/09/19 2346 04/10/19 0404 04/10/19 0748  BP: 135/65 (!) 122/56 136/75 133/71  Pulse: 85 82 83 80  Resp: 18 16 18    Temp: 98.3 F (36.8 C) (!) 97.4 F (36.3 C) (!) 97.5 F (36.4 C)   TempSrc: Oral Oral Oral   SpO2: 95% 100% 96% 99%  Weight:      Height:        Intake/Output Summary (Last  24 hours) at 04/10/2019 1109 Last data filed at 04/10/2019 0955 Gross per 24 hour  Intake  480 ml  Output --  Net 480 ml   Filed Weights   04/08/19 0044  Weight: 77.1 kg    Examination:  General exam: Appears calm and comfortable, appears stated age Respiratory system: Clear to auscultation. Respiratory effort normal. Cardiovascular system: S1 & S2 heard, RRR. No JVD, murmurs, rubs, gallops or clicks. No pedal edema. Gastrointestinal system: Abdomen is nondistended, soft and nontender. No organomegaly or masses felt. Normal bowel sounds heard. Central nervous system: Alert and oriented. No focal neurological deficits. Extremities: Right lower extremity surgical dressings not removed Skin: No rashes, lesions or ulcers Psychiatry: Judgement and insight appear normal. Mood & affect appropriate.     Data Reviewed: I have personally reviewed following labs and imaging studies  CBC: Recent Labs  Lab 04/08/19 0055 04/09/19 0642 04/10/19 0501  WBC 10.0 9.7 9.8  NEUTROABS 7.1  --   --   HGB 10.8* 7.7* 8.2*  HCT 35.0* 23.9* 24.7*  MCV 97.8 98.0 93.2  PLT 322 188 198   Basic Metabolic Panel: Recent Labs  Lab 04/08/19 0055 04/09/19 0642 04/10/19 0501  NA 136 134* 130*  K 4.2 4.1 4.2  CL 99 104 98  CO2 26 26 24   GLUCOSE 75 180* 144*  BUN 22 17 11   CREATININE 0.76 0.57 0.50  CALCIUM 9.7 7.8* 8.0*   GFR: Estimated Creatinine Clearance: 51.2 mL/min (by C-G formula based on SCr of 0.5 mg/dL). Liver Function Tests: Recent Labs  Lab 04/08/19 0055  AST 25  ALT 18  ALKPHOS 201*  BILITOT 0.6  PROT 6.9  ALBUMIN 3.8   No results for input(s): LIPASE, AMYLASE in the last 168 hours. No results for input(s): AMMONIA in the last 168 hours. Coagulation Profile: Recent Labs  Lab 04/08/19 0055  INR 1.0   Cardiac Enzymes: No results for input(s): CKTOTAL, CKMB, CKMBINDEX, TROPONINI in the last 168 hours. BNP (last 3 results) No results for input(s): PROBNP in the last 8760 hours. HbA1C: Recent Labs    04/09/19 0642  HGBA1C 5.6   CBG: Recent Labs  Lab  04/08/19 1411 04/09/19 1209 04/09/19 1645 04/09/19 2044 04/10/19 0749  GLUCAP 105* 122* 258* 115* 129*   Lipid Profile: No results for input(s): CHOL, HDL, LDLCALC, TRIG, CHOLHDL, LDLDIRECT in the last 72 hours. Thyroid Function Tests: Recent Labs    04/08/19 0713  TSH 1.826   Anemia Panel: No results for input(s): VITAMINB12, FOLATE, FERRITIN, TIBC, IRON, RETICCTPCT in the last 72 hours. Sepsis Labs: No results for input(s): PROCALCITON, LATICACIDVEN in the last 168 hours.  Recent Results (from the past 240 hour(s))  Respiratory Panel by RT PCR (Flu A&B, Covid) - Nasopharyngeal Swab     Status: None   Collection Time: 04/08/19  2:35 AM   Specimen: Nasopharyngeal Swab  Result Value Ref Range Status   SARS Coronavirus 2 by RT PCR NEGATIVE NEGATIVE Final    Comment: (NOTE) SARS-CoV-2 target nucleic acids are NOT DETECTED. The SARS-CoV-2 RNA is generally detectable in upper respiratoy specimens during the acute phase of infection. The lowest concentration of SARS-CoV-2 viral copies this assay can detect is 131 copies/mL. A negative result does not preclude SARS-Cov-2 infection and should not be used as the sole basis for treatment or other patient management decisions. A negative result may occur with  improper specimen collection/handling, submission of specimen other than nasopharyngeal swab, presence of viral mutation(s) within the areas  targeted by this assay, and inadequate number of viral copies (<131 copies/mL). A negative result must be combined with clinical observations, patient history, and epidemiological information. The expected result is Negative. Fact Sheet for Patients:  https://www.moore.com/ Fact Sheet for Healthcare Providers:  https://www.young.biz/ This test is not yet ap proved or cleared by the [place] FDA and  has been authorized for detection and/or diagnosis of SARS-CoV-2 by FDA under an Emergency Use  Authorization (EUA). This EUA will remain  in effect (meaning this test can be used) for the duration of the COVID-19 declaration under Section 564(b)(1) of the Act, 21 U.S.C. section 360bbb-3(b)(1), unless the authorization is terminated or revoked sooner.    Influenza A by PCR NEGATIVE NEGATIVE Final   Influenza B by PCR NEGATIVE NEGATIVE Final    Comment: (NOTE) The Xpert Xpress SARS-CoV-2/FLU/RSV assay is intended as an aid in  the diagnosis of influenza from Nasopharyngeal swab specimens and  should not be used as a sole basis for treatment. Nasal washings and  aspirates are unacceptable for Xpert Xpress SARS-CoV-2/FLU/RSV  testing. Fact Sheet for Patients: https://www.moore.com/ Fact Sheet for Healthcare Providers: https://www.young.biz/ This test is not yet approved or cleared by the [place] FDA and  has been authorized for detection and/or diagnosis of SARS-CoV-2 by  FDA under an Emergency Use Authorization (EUA). This EUA will remain  in effect (meaning this test can be used) for the duration of the  Covid-19 declaration under Section 564(b)(1) of the Act, 21  U.S.C. section 360bbb-3(b)(1), unless the authorization is  terminated or revoked. Performed at [facility], [address], [place], [place] [address]   Surgical pcr screen     Status: Abnormal   Collection Time: 04/08/19  6:23 AM   Specimen: Nasal Mucosa; Nasal Swab  Result Value Ref Range Status   MRSA, PCR NEGATIVE NEGATIVE Final   Staphylococcus aureus POSITIVE (A) NEGATIVE Final    Comment: (NOTE) The Xpert SA Assay (FDA approved for NASAL specimens in patients 59 years of age and older), is one component of a comprehensive surveillance program. It is not intended to diagnose infection nor to guide or monitor treatment. Performed at [facility], [address], [place], [place] [address]   Urine Culture     Status: Abnormal   Collection  Time: 04/08/19 10:35 AM   Specimen: Urine, Random  Result Value Ref Range Status   Specimen Description   Final    URINE, RANDOM Performed at [facility], [address] [place], [place] [address]    Special Requests   Final    Normal Performed at [facility], [address] [place], [place] [address]    Culture MULTIPLE SPECIES PRESENT, SUGGEST RECOLLECTION (A)  Final   Report Status 04/09/2019 FINAL  Final         Radiology Studies: DG HIP OPERATIVE UNILAT W OR W/O PELVIS RIGHT  Result Date: 04/08/2019 CLINICAL DATA:  ORIF right greater trochanter fracture EXAM: OPERATIVE RIGHT HIP WITH PELVIS COMPARISON:  04/08/2019 right hip radiographs FLUOROSCOPY TIME:  Fluoroscopy Time:  0 minutes 58 seconds Number of Acquired Spot Images: 4 FINDINGS: Multiple nondiagnostic spot fluoroscopic intraoperative right hip radiographs demonstrate 2 pins in the right femoral neck with interlocking intramedullary rod in the proximal right femoral shaft with distal interlocking screw. IMPRESSION: Intraoperative fluoroscopic guidance for ORIF right greater trochanter fracture. Electronically Signed   By: Delbert Phenix M.D.   On: 04/08/2019 14:59  Scheduled Meds: . atorvastatin  20 mg Oral Daily  . cholecalciferol  2,000 Units Oral Daily  . cinacalcet  30 mg Oral Q M,W,F  . enoxaparin (LOVENOX) injection  40 mg Subcutaneous Q24H  . insulin aspart  0-5 Units Subcutaneous QHS  . insulin aspart  0-9 Units Subcutaneous TID WC  . lisinopril  10 mg Oral Daily  . melatonin  5 mg Oral QHS  . oxybutynin  5 mg Oral BID  . senna-docusate  1 tablet Oral BID   Continuous Infusions: . methocarbamol (ROBAXIN) IV       LOS: 2 days    Time spent: 35 minutes    Sidney Ace, MD [facility] Pager [phone]  If 7PM-7AM, please contact night-coverage 04/10/2019, 11:09 AM

## 2019-04-10 NOTE — Progress Notes (Signed)
Patient is on 2l at this time , her o2 was 82% Room air when NT [place] checked. Encouraged incentive spirometer and o2 came up to 98% with 2L patient also c/o right rib pain . Will notify Dr. Georgeann Oppenheim

## 2019-04-10 NOTE — Progress Notes (Signed)
Physical Therapy Treatment Patient Details Name: Jenna Robinson MRN: [id number] DOB: [birth year] Today's Date: 04/10/2019    History of Present Illness Jenna Robinson is a 86 y.o. Caucasian female with a known history of type diabetes mellitus, hypertension, dyslipidemia and Alzheimer's dementia, who presented to the emergency room with acute onset of recent recurrent falls.  She denied loss of consciousness, paresthesias or focal muscle weakness.  She stated that she got up from her couch and lost her balance before she fell. Pt is s/p Intramedullary nailing of R femur with cephalomedullary device    PT Comments    Pt in chair upon entry, agreeable to participate. Pt is awake, calm, conversational, but answers most questions with 'I don't know.' Pt does know she is in the hospital, but is unable to say why she is here. Pt participates with exercises, but does not attend to task and requires cues to finish each set. ModA to STS with RW, heavy use of UE and poor problem solving for hand placement, but able to follow simple cues. Pt has urinary incontinence with first transfer attempt, then has loss of bladder and bowel on 2nd attempt, no awareness of this. Pt returned to chair, assisted with clean up, purewick put back in place.      Follow Up Recommendations  SNF     Equipment Recommendations  None recommended by PT    Recommendations for Other Services       Precautions / Restrictions Precautions Precautions: Fall Restrictions Weight Bearing Restrictions: Yes RLE Weight Bearing: Weight bearing as tolerated    Mobility  Bed Mobility Overal bed mobility: Needs Assistance Bed Mobility: Supine to Sit     Supine to sit: Min assist;HOB elevated(Asssit for RLE and VCs for sequencing)     General bed mobility comments: in chair at entry  Transfers Overall transfer level: Needs assistance Equipment used: Rolling walker (2 wheeled) Transfers: Sit to/from Stand Sit to Stand:  Mod assist Stand pivot transfers: Mod assist       General transfer comment: weak, heavy use of hands, cues for safety/technique; incontinence upon standing x2  Ambulation/Gait Ambulation/Gait assistance: (not appropriate at this time.)               Stairs             Wheelchair Mobility    Modified Rankin (Stroke Patients Only)       Balance Overall balance assessment: Needs assistance Sitting-balance support: Feet supported;Single extremity supported Sitting balance-Leahy Scale: Good     Standing balance support: Bilateral upper extremity supported;During functional activity Standing balance-Leahy Scale: Poor Standing balance comment: difficulty getting fully up, but with assistance is eventually able to be fully upright. much more stable.                            Cognition Arousal/Alertness: Awake/alert Behavior During Therapy: WFL for tasks assessed/performed Overall Cognitive Status: History of cognitive impairments - at baseline                                 General Comments: Pt has advanced dementia. Unable to provide any reliable information.      Exercises General Exercises - Lower Extremity Ankle Circles/Pumps: 15 reps;Supine;AAROM;Both Long Arc Quad: 10 reps;Seated;AAROM;Both Heel Slides: 10 reps;AAROM;Seated;Both Hip ABduction/ADduction: 10 reps;AAROM;Seated;Both Other Exercises Other Exercises: Toileting, hygiene, don/doff gown, don/doff B socks, bed mobility, sup>sit,  sit<>stand x3, SPT    General Comments        Pertinent Vitals/Pain Pain Assessment: No/denies pain Faces Pain Scale: Hurts little more Pain Location: R hip Pain Descriptors / Indicators: Aching;Guarding Pain Intervention(s): Limited activity within patient's tolerance;Monitored during session;Premedicated before session    Home Living                      Prior Function            PT Goals (current goals can now be found  in the care plan section) Acute Rehab PT Goals Patient Stated Goal: to walk PT Goal Formulation: Patient unable to participate in goal setting Time For Goal Achievement: 04/23/19 Potential to Achieve Goals: Fair Progress towards PT goals: Progressing toward goals    Frequency    BID      PT Plan Current plan remains appropriate    Co-evaluation              AM-PAC PT "6 Clicks" Mobility   Outcome Measure  Help needed turning from your back to your side while in a flat bed without using bedrails?: A Little Help needed moving from lying on your back to sitting on the side of a flat bed without using bedrails?: A Little Help needed moving to and from a bed to a chair (including a wheelchair)?: A Lot Help needed standing up from a chair using your arms (e.g., wheelchair or bedside chair)?: A Lot Help needed to walk in hospital room?: A Lot Help needed climbing 3-5 steps with a railing? : Total 6 Click Score: 13    End of Session Equipment Utilized During Treatment: Gait belt;Oxygen Activity Tolerance: Patient limited by fatigue;Patient tolerated treatment well Patient left: in chair;with chair alarm set   PT Visit Diagnosis: Unsteadiness on feet (R26.81);Muscle weakness (generalized) (M62.81);Difficulty in walking, not elsewhere classified (R26.2)     Time: 9767-3419 PT Time Calculation (min) (ACUTE ONLY): 25 min  Charges:  $Therapeutic Exercise: 23-37 mins                     12:13 PM, 04/10/19 Etta Grandchild, PT, DPT Physical Therapist - [facility]  [phone] ([place])    Loriel Diehl C 04/10/2019, 12:07 PM

## 2019-04-10 NOTE — Progress Notes (Signed)
  Subjective: 2 Days Post-Op Procedure(s) (LRB): INTRAMEDULLARY (IM) NAIL INTERTROCHANTRIC (Right) Patient reports pain as mild to moderate.   Patient is well, and has had no acute complaints or problems Negative for chest pain and shortness of breath Fever: no Gastrointestinal: negative for nausea and vomiting.  Patient has not had a bowel movement.  Objective: Vital signs in last 24 hours: Temp:  [97.4 F (36.3 C)-98.3 F (36.8 C)] 97.5 F (36.4 C) (03/29 0404) Pulse Rate:  [70-89] 83 (03/29 0404) Resp:  [16-18] 18 (03/29 0404) BP: (102-136)/(55-75) 136/75 (03/29 0404) SpO2:  [93 %-100 %] 96 % (03/29 0404)  Intake/Output from previous day:  Intake/Output Summary (Last 24 hours) at 04/10/2019 0710 Last data filed at 04/09/2019 1844 Gross per 24 hour  Intake 1403.77 ml  Output --  Net 1403.77 ml    Intake/Output this shift: No intake/output data recorded.  Labs: Recent Labs    04/08/19 0055 04/09/19 0642 04/10/19 0501  HGB 10.8* 7.7* 8.2*   Recent Labs    04/09/19 0642 04/10/19 0501  WBC 9.7 9.8  RBC 2.44* 2.65*  HCT 23.9* 24.7*  PLT 188 198   Recent Labs    04/09/19 0642 04/10/19 0501  NA 134* 130*  K 4.1 4.2  CL 104 98  CO2 26 24  BUN 17 11  CREATININE 0.57 0.50  GLUCOSE 180* 144*  CALCIUM 7.8* 8.0*   Recent Labs    04/08/19 0055  INR 1.0     EXAM General - Patient is Alert, Appropriate and Oriented Extremity - Neurovascular intact Dorsiflexion/Plantar flexion intact Compartment soft Dressing/Incision -mild sanguinous drainage noted from the proximal incision; minimal drainge noted from other two.  Motor Function - intact, moving foot and toes well on exam.     Assessment/Plan: 2 Days Post-Op Procedure(s) (LRB): INTRAMEDULLARY (IM) NAIL INTERTROCHANTRIC (Right) Active Problems:   Closed right hip fracture (HCC)  Estimated body mass index is 23.71 kg/m as calculated from the following:   Height as of this encounter: 5\' 11"  (1.803  m).   Weight as of this encounter: 77.1 kg. Advance diet Up with therapy  Possible discharge to skilled nursing today versus tomorrow.  Acute postop blood loss anemia.  Hemoglobin stable at 8.2.  DVT Prophylaxis - Lovenox Weight-Bearing as tolerated to right leg  , PA-C [facility] Orthopaedic Surgery 04/10/2019, 7:10 AM

## 2019-04-10 NOTE — TOC Initial Note (Signed)
Transition of Care [facility]) - Initial/Assessment Note    Patient Details  Name: Jenna Robinson MRN: [id number] Date of Birth: August 23, 1925  [facility]) CM/SW Contact:    Shelbie Ammons, RN Phone Number: 04/10/2019, 10:04 AM  Clinical Narrative:   RNCM placed call to patient's legal guardian [name] Reaves for assessment. Patient is a resident of [place] and has been there for about a year. [name] makes all patient's decisions and reports that she has been very happy at [facility]. Discussed recommendation that patient go to SNF for rehabilitation. [name] reports that she is aware and in agreement with this, she reports that she would prefer [facility], [facility] or [facility] but is agreeable to her being sent out to all the area facilities. PASSR confirmed, FL2 completed and bed search initiated.             Expected Discharge Plan: Skilled Nursing Facility Barriers to Discharge: No Barriers Identified   Patient Goals and CMS Choice     Choice offered to / list presented to : Patient  Expected Discharge Plan and Services Expected Discharge Plan: [place]   Discharge Planning Services: CM Consult   Living arrangements for the past 2 months: [place]                                      Prior Living Arrangements/Services Living arrangements for the past 2 months: [place] Lives with:: Facility Resident Patient language and need for interpreter reviewed:: Yes Do you feel safe going back to the place where you live?: Yes      Need for Family Participation in Patient Care: Yes (Comment) Care giver support system in place?: Yes (comment)   Criminal Activity/Legal Involvement Pertinent to Current Situation/Hospitalization: No - Comment as needed  Activities of Daily Living Home Assistive Devices/Equipment: Walker (specify type) ADL Screening (condition at time of admission) Patient's cognitive ability adequate to  safely complete daily activities?: No Is the patient deaf or have difficulty hearing?: No Does the patient have difficulty seeing, even when wearing glasses/contacts?: No Does the patient have difficulty concentrating, remembering, or making decisions?: Yes Patient able to express need for assistance with ADLs?: Yes Does the patient have difficulty dressing or bathing?: Yes Independently performs ADLs?: No Communication: Independent Dressing (OT): Needs assistance Grooming: Needs assistance Feeding: Independent Bathing: Needs assistance Toileting: Needs assistance In/Out Bed: Needs assistance Walks in Home: Needs assistance Does the patient have difficulty walking or climbing stairs?: Yes Weakness of Legs: Both Weakness of Arms/Hands: None  Permission Sought/Granted      Share Information with NAME: [name] Reaves           Emotional Assessment       Orientation: : Oriented to Self, Oriented to Place, Oriented to  Time Alcohol / Substance Use: Not Applicable Psych Involvement: No (comment)  Admission diagnosis:  Closed right hip fracture (HCC) [S72.001A] Closed displaced fracture of right femoral neck (HCC) [S72.001A] Ovarian mass, right [N83.8] Displaced fracture of greater trochanter of right femur, initial encounter for closed fracture ([place]) [S72.111A] Dementia without behavioral disturbance, unspecified dementia type ([place]) [F03.90] Patient Active Problem List   Diagnosis Date Noted  . Closed right hip fracture ([place]) 04/08/2019  . Hypercalcemia 05/22/2015  . Elevated troponin 01/02/2015  . Rhabdomyolysis 01/02/2015   PCP:  Elba Barman, MD Pharmacy:   [place], [place] -  [address] [place] [address] Phone: [phone] Fax: [phone]     Social Determinants of Health (SDOH) Interventions    Readmission Risk Interventions No flowsheet data found.

## 2019-04-10 NOTE — Progress Notes (Signed)
Patient had large BM x 1, and upon ambulation with PT her o2 was 95% R,ASSESSMENT:

## 2019-04-10 NOTE — NC FL2 (Signed)
MEDICAID FL2 LEVEL OF CARE SCREENING TOOL     IDENTIFICATION  Patient Name: Jenna Robinson Birthdate: Jun 11, 1925 Sex: female Admission Date (Current Location): 04/08/2019  [place] and [place] Number:  Chiropodist and Address:  [facility], [address], [place], [place] [address]      Provider Number: [id number]  Attending Physician Name and Address:  Tresa Moore, MD  Relative Name and Phone Number:  Arnette Norris (guardian) [phone]    Current Level of Care: Hospital Recommended Level of Care: Skilled Nursing Facility Prior Approval Number:    Date Approved/Denied:   PASRR Number: [id number]  Discharge Plan: SNF    Current Diagnoses: Patient Active Problem List   Diagnosis Date Noted  . Closed right hip fracture (HCC) 04/08/2019  . Hypercalcemia 05/22/2015  . Elevated troponin 01/02/2015  . Rhabdomyolysis 01/02/2015    Orientation RESPIRATION BLADDER Height & Weight     Self, Time, Place  Normal Incontinent Weight: 77.1 kg Height:  5\' 11"  (180.3 cm)  BEHAVIORAL SYMPTOMS/MOOD NEUROLOGICAL BOWEL NUTRITION STATUS      Continent Diet(Carb Modified)  AMBULATORY STATUS COMMUNICATION OF NEEDS Skin   Extensive Assist Verbally Surgical wounds                       Personal Care Assistance Level of Assistance  Dressing, Feeding, Bathing Bathing Assistance: Limited assistance Feeding assistance: Limited assistance Dressing Assistance: Limited assistance     Functional Limitations Info             SPECIAL CARE FACTORS FREQUENCY  OT (By licensed OT), PT (By licensed PT)                    Contractures Contractures Info: Not present    Additional Factors Info  Code Status, Allergies Code Status Info: Full Allergies Info: no known allergies           Current Medications (04/10/2019):  This is the current hospital active medication list Current Facility-Administered Medications   Medication Dose Route Frequency Provider Last Rate Last Admin  . atorvastatin (LIPITOR) tablet 20 mg  20 mg Oral Daily 04/12/2019, MD   20 mg at 04/09/19 1707  . bisacodyl (DULCOLAX) suppository 10 mg  10 mg Rectal Daily PRN 04/11/19, MD      . cholecalciferol (VITAMIN D3) tablet 2,000 Units  2,000 Units Oral Daily Signa Kell, MD   2,000 Units at 04/10/19 0805  . cinacalcet (SENSIPAR) tablet 30 mg  30 mg Oral Q M,W,F 04/12/19, MD      . enoxaparin (LOVENOX) injection 40 mg  40 mg Subcutaneous Q24H Signa Kell, MD   40 mg at 04/10/19 0831  . HYDROmorphone (DILAUDID) injection 0.25-0.5 mg  0.25-0.5 mg Intravenous Q2H PRN 04/12/19, MD      . insulin aspart (novoLOG) injection 0-5 Units  0-5 Units Subcutaneous QHS Sreenath, Sudheer B, MD      . insulin aspart (novoLOG) injection 0-9 Units  0-9 Units Subcutaneous TID WC Signa Kell B, MD   2 Units at 04/10/19 0831  . lisinopril (ZESTRIL) tablet 10 mg  10 mg Oral Daily 04/12/19, MD   10 mg at 04/10/19 0807  . melatonin tablet 5 mg  5 mg Oral QHS 04/12/19, MD   5 mg at 04/09/19 2102  . methocarbamol (ROBAXIN) tablet 500 mg  500 mg Oral Q6H PRN 2103, MD  Or  . methocarbamol (ROBAXIN) 500 mg in dextrose 5 % 50 mL IVPB  500 mg Intravenous Q6H PRN Leim Fabry, MD      . metoCLOPramide (REGLAN) tablet 5-10 mg  5-10 mg Oral Q8H PRN Leim Fabry, MD       Or  . metoCLOPramide (REGLAN) injection 5-10 mg  5-10 mg Intravenous Q8H PRN Leim Fabry, MD      . ondansetron [facility]) tablet 4 mg  4 mg Oral Q6H PRN Leim Fabry, MD       Or  . ondansetron [facility]) injection 4 mg  4 mg Intravenous Q6H PRN Leim Fabry, MD      . oxybutynin (DITROPAN) tablet 5 mg  5 mg Oral BID Leim Fabry, MD   5 mg at 04/10/19 0806  . oxyCODONE (Oxy IR/ROXICODONE) immediate release tablet 2.5-5 mg  2.5-5 mg Oral Q3H PRN Leim Fabry, MD   2.5 mg at 04/09/19 1016  . oxyCODONE (Oxy IR/ROXICODONE) immediate release tablet 5-10 mg  5-10 mg  Oral Q4H PRN Leim Fabry, MD      . senna-docusate (Senokot-S) tablet 1 tablet  1 tablet Oral BID Ralene Muskrat B, MD      . sodium phosphate (FLEET) 7-19 GM/118ML enema 1 enema  1 enema Rectal Once PRN Leim Fabry, MD      . traMADol Veatrice [facility]bon) tablet 50 mg  50 mg Oral Q6H PRN Leim Fabry, MD   50 mg at 04/10/19 0806  . traZODone (DESYREL) tablet 25 mg  25 mg Oral QHS PRN Leim Fabry, MD   25 mg at 04/08/19 2309     Discharge Medications: Please see discharge summary for a list of discharge medications.  Relevant Imaging Results:  Relevant Lab Results:   Additional Information SS# [id number]  Shelbie Ammons, RN

## 2019-04-10 NOTE — Progress Notes (Signed)
Occupational Therapy Treatment Patient Details Name: Jenna Robinson MRN: [id number] DOB: Jan 02, 1926 Today's Date: 04/10/2019    History of present illness Per MD note:Jenna Robinson  is a 86 y.o. Caucasian female with a known history of type diabetes mellitus, hypertension, dyslipidemia and Alzheimer's dementia, who presented to the emergency room with acute onset of recent recurrent falls.  She denied loss of consciousness, paresthesias or focal muscle weakness.  She stated that she got up from her couch and lost her balance before she fell. Pt is s/p Intramedullary nailing of R femur with cephalomedullary device   OT comments  Jenna Robinson was seen for OT treatment on this date. Upon arrival to room pt reclined in bed, agreeable to tx, and denies pain. Pt denies need to use BSC, agreeable to transfer to chair. Upon standing attempt pt became incontinent of bladder - requiring MAX A + RW for hygiene in standing and MAX A for LBD / MIN A for LBD seated in chair. Pt making good progress toward goals. Pt continues to benefit from skilled OT services to maximize return to PLOF and minimize risk of future falls, injury, caregiver burden, and readmission. Will continue to follow POC. Discharge recommendation remains appropriate.    Follow Up Recommendations  SNF    Equipment Recommendations       Recommendations for Other Services      Precautions / Restrictions Precautions Precautions: Fall Restrictions Weight Bearing Restrictions: Yes RLE Weight Bearing: Weight bearing as tolerated       Mobility Bed Mobility Overal bed mobility: Needs Assistance Bed Mobility: Supine to Sit     Supine to sit: Min assist;HOB elevated(Asssit for RLE and VCs for sequencing)        Transfers Overall transfer level: Needs assistance Equipment used: Rolling walker (2 wheeled) Transfers: Sit to/from [facility] Sit to Stand: Mod assist Stand pivot transfers: Mod assist             Balance Overall balance assessment: Needs assistance Sitting-balance support: Feet supported;Single extremity supported Sitting balance-Leahy Scale: Good     Standing balance support: Bilateral upper extremity supported Standing balance-Leahy Scale: Fair Standing balance comment: Pt was not condfident in standing                           ADL either performed or assessed with clinical judgement   ADL Overall ADL's : Needs assistance/impaired                                       General ADL Comments: MAX A + RW toileting in standing at EOB - assist for perihygiene (pt stated did not need to pee, purewick removed for t/f from bed to chair, pt became incontinent of bladder upon standing). MAX A + RW to clean rear and BLE in standing at EOB. MAX A don/doff B socks seated in chair. MIN A don/doff gown seated in chair. SETUP self-feeding reclined in chair     Vision       Perception     Praxis      Cognition Arousal/Alertness: Awake/alert Behavior During Therapy: WFL for tasks assessed/performed Overall Cognitive Status: No family/caregiver present to determine baseline cognitive functioning(Hx of dementia)  Exercises Exercises: Other exercises Other Exercises Other Exercises: Toileting, hygiene, don/doff gown, don/doff B socks, bed mobility, sup>sit, sit<>stand x3, SPT   Shoulder Instructions       General Comments      Pertinent Vitals/ Pain       Pain Assessment: Faces Faces Pain Scale: Hurts little more Pain Location: R hip Pain Descriptors / Indicators: Aching;Guarding Pain Intervention(s): Repositioned;Limited activity within patient's tolerance  Home Living                                          Prior Functioning/Environment              Frequency  Min 2X/week        Progress Toward Goals  OT Goals(current goals can now be found in the  care plan section)  Progress towards OT goals: Progressing toward goals  Acute Rehab OT Goals Patient Stated Goal: to walk OT Goal Formulation: With patient Time For Goal Achievement: 04/23/19 Potential to Achieve Goals: Fair ADL Goals Pt Will Perform Grooming: with supervision;standing(c LRAD PRN and no cues for seated rest breaks) Pt Will Perform Lower Body Dressing: with min guard assist;with caregiver independent in assisting;sit to/from stand(c LRAD & AE PRN) Pt Will Transfer to Toilet: with supervision;stand pivot transfer;bedside commode(c LRAD PRN)  Plan Discharge plan remains appropriate;Frequency remains appropriate    Co-evaluation                 AM-PAC OT "6 Clicks" Daily Activity     Outcome Measure   Help from another person eating meals?: A Little Help from another person taking care of personal grooming?: A Little Help from another person toileting, which includes using toliet, bedpan, or urinal?: A Lot Help from another person bathing (including washing, rinsing, drying)?: A Lot Help from another person to put on and taking off regular upper body clothing?: A Little Help from another person to put on and taking off regular lower body clothing?: A Lot 6 Click Score: 15    End of Session Equipment Utilized During Treatment: Rolling walker  OT Visit Diagnosis: Unsteadiness on feet (R26.81);Other abnormalities of gait and mobility (R26.89)   Activity Tolerance Patient tolerated treatment well   Patient Left in chair;with call bell/phone within reach;with chair alarm set   Nurse Communication          Time: 2130-8657 OT Time Calculation (min): 34 min  Charges: OT General Charges $OT Visit: 1 Visit OT Treatments $Self Care/Home Management : 23-37 mins  Kathie Dike, M.S. OTR/L  04/10/19, 11:17 AM

## 2019-04-10 NOTE — Discharge Instructions (Signed)
INSTRUCTIONS AFTER Surgery  o Remove items at home which could result in a fall. This includes throw rugs or furniture in walking pathways o ICE to the affected joint every three hours while awake for 30 minutes at a time, for at least the first 3-5 days, and then as needed for pain and swelling.  Continue to use ice for pain and swelling. You may notice swelling that will progress down to the foot and ankle.  This is normal after surgery.  Elevate your leg when you are not up walking on it.   o Continue to use the breathing machine you got in the hospital (incentive spirometer) which will help keep your temperature down.  It is common for your temperature to cycle up and down following surgery, especially at night when you are not up moving around and exerting yourself.  The breathing machine keeps your lungs expanded and your temperature down.   DIET:  As you were doing prior to hospitalization, we recommend a well-balanced diet.  DRESSING / WOUND CARE / SHOWERING  Dressing change as needed.  No showering.  Staples will be removed at [facility] clinic in 2 weeks  ACTIVITY  o Increase activity slowly as tolerated, but follow the weight bearing instructions below.   o No driving for 6 weeks or until further direction given by your physician.  You cannot drive while taking narcotics.  o No lifting or carrying greater than 10 lbs. until further directed by your surgeon. o Avoid periods of inactivity such as sitting longer than an hour when not asleep. This helps prevent blood clots.  o You may return to work once you are authorized by your doctor.     WEIGHT BEARING  Weightbearing as tolerated on the right   EXERCISES Gait training and ambulation training with a walker.  CONSTIPATION  Constipation is defined medically as fewer than three stools per week and severe constipation as less than one stool per week.  Even if you have a regular bowel pattern at home, your normal regimen is likely  to be disrupted due to multiple reasons following surgery.  Combination of anesthesia, postoperative narcotics, change in appetite and fluid intake all can affect your bowels.   YOU MUST use at least one of the following options; they are listed in order of increasing strength to get the job done.  They are all available over the counter, and you may need to use some, POSSIBLY even all of these options:    Drink plenty of fluids (prune juice may be helpful) and high fiber foods Colace 100 mg by mouth twice a day  Senokot for constipation as directed and as needed Dulcolax (bisacodyl), take with full glass of water  Miralax (polyethylene glycol) once or twice a day as needed.  If you have tried all these things and are unable to have a bowel movement in the first 3-4 days after surgery call either your surgeon or your primary doctor.    If you experience loose stools or diarrhea, hold the medications until you stool forms back up.  If your symptoms do not get better within 1 week or if they get worse, check with your doctor.  If you experience "the worst abdominal pain ever" or develop nausea or vomiting, please contact the office immediately for further recommendations for treatment.   ITCHING:  If you experience itching with your medications, try taking only a single pain pill, or even half a pain pill at a time.  You can also use Benadryl over the counter for itching or also to help with sleep.   TED HOSE STOCKINGS:  Use stockings on both legs until for at least 2 weeks or as directed by physician office. They may be removed at night for sleeping.  MEDICATIONS:  See your medication summary on the "After Visit Summary" that nursing will review with you.  You may have some home medications which will be placed on hold until you complete the course of blood thinner medication.  It is important for you to complete the blood thinner medication as prescribed.  PRECAUTIONS:  If you experience chest  pain or shortness of breath - call 911 immediately for transfer to the hospital emergency department.   If you develop a fever greater that 101 F, purulent drainage from wound, increased redness or drainage from wound, foul odor from the wound/dressing, or calf pain - CONTACT YOUR SURGEON.                                                   FOLLOW-UP APPOINTMENTS:  If you do not already have a post-op appointment, please call the office for an appointment to be seen by your surgeon.  Guidelines for how soon to be seen are listed in your "After Visit Summary", but are typically between 1-4 weeks after surgery.  OTHER INSTRUCTIONS:     MAKE SURE YOU:  . Understand these instructions.  . Get help right away if you are not doing well or get worse.    Thank you for letting us be a part of your medical care team.  It is a privilege we respect greatly.  We hope these instructions will help you stay on track for a fast and full recovery!

## 2019-04-11 LAB — CBC
HCT: 24.8 % — ABNORMAL LOW (ref 36.0–46.0)
Hemoglobin: 8 g/dL — ABNORMAL LOW (ref 12.0–15.0)
MCH: 30.7 pg (ref 26.0–34.0)
MCHC: 32.3 g/dL (ref 30.0–36.0)
MCV: 95 fL (ref 80.0–100.0)
Platelets: 208 10*3/uL (ref 150–400)
RBC: 2.61 MIL/uL — ABNORMAL LOW (ref 3.87–5.11)
RDW: 14.6 % (ref 11.5–15.5)
WBC: 8.5 10*3/uL (ref 4.0–10.5)
nRBC: 0 % (ref 0.0–0.2)

## 2019-04-11 LAB — BASIC METABOLIC PANEL
Anion gap: 9 (ref 5–15)
BUN: 13 mg/dL (ref 8–23)
CO2: 25 mmol/L (ref 22–32)
Calcium: 8 mg/dL — ABNORMAL LOW (ref 8.9–10.3)
Chloride: 97 mmol/L — ABNORMAL LOW (ref 98–111)
Creatinine, Ser: 0.49 mg/dL (ref 0.44–1.00)
GFR calc Af Amer: >60 mL/min (ref >60)
GFR calc non Af Amer: >60 mL/min (ref >60)
Glucose, Bld: 140 mg/dL — ABNORMAL HIGH (ref 70–99)
Potassium: 4.1 mmol/L (ref 3.5–5.1)
Sodium: 131 mmol/L — ABNORMAL LOW (ref 135–145)

## 2019-04-11 LAB — GLUCOSE, CAPILLARY
Glucose-Capillary: 114 mg/dL — ABNORMAL HIGH (ref 70–99)
Glucose-Capillary: 198 mg/dL — ABNORMAL HIGH (ref 70–99)

## 2019-04-11 NOTE — Care Management Important Message (Signed)
Important Message  Patient Details  Name: Jenna Robinson MRN: [id number] Date of Birth: May 01, 1925   Medicare Important Message Given:  Yes     Bernadette Hoit 04/11/2019, 10:18 AM

## 2019-04-11 NOTE — Progress Notes (Signed)
Physical Therapy Treatment Patient Details Name: Jenna Robinson MRN: [id number] DOB: 12/05/1925 Today's Date: 04/11/2019    History of Present Illness Jenna Robinson is a 86 y.o. Caucasian female with a known history of type diabetes mellitus, hypertension, dyslipidemia and Alzheimer's dementia, who presented to the emergency room with acute onset of recent recurrent falls.  She denied loss of consciousness, paresthesias or focal muscle weakness.  She stated that she got up from her couch and lost her balance before she fell. Pt is s/p Intramedullary nailing of R femur with cephalomedullary device    PT Comments    Pt was long sitting in bed upon arriving. She agrees to PT session and is cooperative and pleasant. Pt is confused but able to follow commands consistantly.  She knew she was in hospital but unaware why, date,month, or year. Pt required min assist to exit bed. Pt is incontinent and has several episodes of urination with movements. Pt was then able to stand and ambulate around bed with Min assist. No LOB or unsteadiness. Pt does fatigue quickly. Therapist reviewed and pt perform HEP handout. Overall pt tolerated session well and continues to progress with PT. PT recommends DC to SNF since pt lives alone and has safety concerns. She will benefit from SNF to address deficits with strength, balance, and safe functional mobility.    Follow Up Recommendations  SNF     Equipment Recommendations  None recommended by PT    Recommendations for Other Services       Precautions / Restrictions Precautions Precautions: Fall Restrictions Weight Bearing Restrictions: Yes RLE Weight Bearing: Weight bearing as tolerated    Mobility  Bed Mobility Overal bed mobility: Needs Assistance Bed Mobility: Supine to Sit     Supine to sit: Min assist;HOB elevated     General bed mobility comments: Pt was able to exit L side of bed with Min assist for safety. Vcs throughout for improved  technique and sequencing.  Transfers Overall transfer level: Needs assistance Equipment used: Rolling walker (2 wheeled) Transfers: Sit to/from Stand Sit to Stand: Min assist         General transfer comment: Min assist to stand from EOB 3 x. Vcs for handplacement and improved technique. Pt does c/o increased time with wt bearing.  Ambulation/Gait Ambulation/Gait assistance: Min guard Gait Distance (Feet): 15 Feet Assistive device: Rolling walker (2 wheeled) Gait Pattern/deviations: Step-to pattern Gait velocity: decreased   General Gait Details: pt has very antalgic gait pattern but no LOB or unsteadiness noted. Limited distance 2/2 to incontinence episodes and needing to use BR.   Stairs             Wheelchair Mobility    Modified Rankin (Stroke Patients Only)       Balance                                            Cognition Arousal/Alertness: Awake/alert Behavior During Therapy: WFL for tasks assessed/performed Overall Cognitive Status: History of cognitive impairments - at baseline                                 General Comments: pt is pleasantly confused. She knew she was in hospital however unable to state why, day, year. Denies pain. She was able to follow command throughout.  Exercises General Exercises - Lower Extremity Ankle Circles/Pumps: 15 reps;Supine;AAROM;Both Short Arc Quad: 10 reps Long [employer]: 10 reps;Seated;AAROM;Both Heel Slides: 10 reps;AAROM;Seated;Both Hip ABduction/ADduction: 10 reps;AAROM;Seated;Both    General Comments        Pertinent Vitals/Pain Pain Assessment: No/denies pain Pain Score: 0-No pain Pain Location: R hip with bed mobility Pain Descriptors / Indicators: Aching;Guarding Pain Intervention(s): Limited activity within patient's tolerance;Monitored during session;Premedicated before session;Repositioned    Home Living                      Prior Function             PT Goals (current goals can now be found in the care plan section) Acute Rehab PT Goals Patient Stated Goal: "To go home" Progress towards PT goals: Progressing toward goals    Frequency    BID      PT Plan Current plan remains appropriate    Co-evaluation              AM-PAC PT "6 Clicks" Mobility   Outcome Measure  Help needed turning from your back to your side while in a flat bed without using bedrails?: A Lot Help needed moving from lying on your back to sitting on the side of a flat bed without using bedrails?: A Lot Help needed moving to and from a bed to a chair (including a wheelchair)?: A Lot Help needed standing up from a chair using your arms (e.g., wheelchair or bedside chair)?: A Lot Help needed to walk in hospital room?: A Little Help needed climbing 3-5 steps with a railing? : A Lot 6 Click Score: 13    End of Session Equipment Utilized During Treatment: Gait belt Activity Tolerance: Patient tolerated treatment well Patient left: in chair;with call bell/phone within reach;with chair alarm set Nurse Communication: Mobility status PT Visit Diagnosis: Unsteadiness on feet (R26.81);Muscle weakness (generalized) (M62.81);Difficulty in walking, not elsewhere classified (R26.2)     Time: 1010-1025 PT Time Calculation (min) (ACUTE ONLY): 15 min  Charges:  $Therapeutic Activity: 8-22 mins                     Jenna Robinson PTA 04/11/19, 11:08 AM

## 2019-04-11 NOTE — Progress Notes (Signed)
D: Pt alert and oriented x 2 (self/place). Pt denies experiencing any pain at this time.  A: Pt, care facility [facility]), health care proxy [name] Reades  received discharge and medication education/information. Pt belongings were taken with pt upon discharge.   R: Pt, healthcare proxy ([name] Reades) and care facility ([facility]) verbalized understanding of discharge and medication education/information.  Pt was transported via EMS to [facility]. Health care proxy was notified of pt's discharge. Report was given to [facility] at [facility] at 1340, no further questions upon report given.

## 2019-04-11 NOTE — Progress Notes (Signed)
  Subjective: 3 Days Post-Op Procedure(s) (LRB): INTRAMEDULLARY (IM) NAIL INTERTROCHANTRIC (Right) Patient reports pain as mild to moderate.   Patient is well, and has had no acute complaints or problems Negative for chest pain and shortness of breath Fever: no Gastrointestinal: negative for nausea and vomiting.  Patient had a bowel movement.  Objective: Vital signs in last 24 hours: Temp:  [98 F (36.7 C)-98.3 F (36.8 C)] 98 F (36.7 C) (03/30 0338) Pulse Rate:  [78-83] 78 (03/30 0338) Resp:  [17-20] 18 (03/30 0338) BP: (106-141)/(54-76) 126/55 (03/30 0338) SpO2:  [95 %-99 %] 97 % (03/30 0338)  Intake/Output from previous day:  Intake/Output Summary (Last 24 hours) at 04/11/2019 0729 Last data filed at 04/11/2019 0500 Gross per 24 hour  Intake 360 ml  Output 801 ml  Net -441 ml    Intake/Output this shift: No intake/output data recorded.  Labs: Recent Labs    04/09/19 0642 04/10/19 0501 04/11/19 0503  HGB 7.7* 8.2* 8.0*   Recent Labs    04/10/19 0501 04/11/19 0503  WBC 9.8 8.5  RBC 2.65* 2.61*  HCT 24.7* 24.8*  PLT 198 208   Recent Labs    04/10/19 0501 04/11/19 0503  NA 130* 131*  K 4.2 4.1  CL 98 97*  CO2 24 25  BUN 11 13  CREATININE 0.50 0.49  GLUCOSE 144* 140*  CALCIUM 8.0* 8.0*   No results for input(s): LABPT, INR in the last 72 hours.   EXAM General - Patient is Alert, Appropriate and Oriented Extremity - Neurovascular intact Dorsiflexion/Plantar flexion intact Compartment soft Dressing/Incision -mild sanguinous drainage noted from the proximal incision; minimal drainge noted from other two.  Motor Function - intact, moving foot and toes well on exam.     Assessment/Plan: 3 Days Post-Op Procedure(s) (LRB): INTRAMEDULLARY (IM) NAIL INTERTROCHANTRIC (Right) Active Problems:   Closed right hip fracture (HCC)  Estimated body mass index is 23.71 kg/m as calculated from the following:   Height as of this encounter: 5\' 11"  (1.803  m).   Weight as of this encounter: 77.1 kg. Advance diet Up with therapy  Possible discharge to skilled nursing today versus tomorrow.  Acute postop blood loss anemia.  Hemoglobin stable at 8.0.  DVT Prophylaxis - Lovenox Weight-Bearing as tolerated to right leg  , PA-C [facility] 04/11/2019, 7:29 AM

## 2019-04-11 NOTE — Discharge Summary (Signed)
Physician Discharge Summary  Deola Rewis WHQ:[id number] DOB: 17-Oct-1925 DOA: 04/08/2019  PCP: Elba Barman, MD  Admit date: 04/08/2019 Discharge date: 04/11/2019  Admitted From: Home Disposition:  SNF  Recommendations for Outpatient Follow-up:  1. Follow up with PCP in 1-2 weeks 2. Follow-up with outpatient orthopedics  Home Health: No Equipment/Devices: None Discharge Condition: Stable CODE STATUS: Full Diet recommendation: Heart Healthy  Brief/Interim Summary:86 y.o.Caucasian femalewith a known history of type diabetes mellitus, hypertension, dyslipidemia and Alzheimer's dementia, who presented to the emergency room with acute onset of recent recurrent falls. She denied loss of consciousness, paresthesias or focal muscle weakness. She stated that she got up from her couch and lost her balance before she fell. No chest pain or dyspnea palpitations cough or wheezing or hemoptysis. No dysuria, oliguria or hematuria or flank pain.  Upon presentation to the emergency room respiratory rate was 24 and otherwise vital signs within normal. Labs revealed unremarkable CMP and mild anemia worse than previous levels. COVID-19 PCR and influenza antigens came back negative. Portable chest x-ray showed no acute cardiopulmonary disease. Right hip CT showed extensive comminuted mildly displaced greater trochanteric fracture and an incomplete nondisplaced fracture through the superior femoral neck with moderate to advanced osteoarthritis and diffuse gluteal musculature edema with probable partial disruption of the gluteal tendon insertion site  3/28: Patient seen and examined.  Postoperative day #1 status post intramedullary nailing right femur, percutaneous fixation of right femoral neck, open treatment of right greater trochanter fracture without fixation.  Tolerated procedure well.  Pain well controlled postoperatively.  Physical therapy evaluated patient.  Recommended skilled nursing  facility placement.  Patient on subcutaneous Lovenox 40 mg daily.  4-week course per orthopedic recs  3/29: Patient seen and examined.  Postoperative day 2 status post intramedullary nail right femur.  Little bit more pain than yesterday.  Physical therapy recommending skilled nursing facility.  TOC team involved and bed search initiated.  Patient remains on Lovenox 40 mg subcu daily.  Still has not had a bowel movement.  3/30: Patient seen and examined.  Postoperative day #3.  Pain well controlled.  Stable for discharge to skilled nursing facility.  Will remain on Lovenox subcutaneous 40 mg daily times total 4 weeks.  Patient successfully had BM.  Weaned from supplemental oxygen.   Discharge Diagnoses:  Active Problems:   Closed right hip fracture [facility])    Discharge Instructions  Discharge Instructions    Diet - low sodium heart healthy   Complete by: As directed    Increase activity slowly   Complete by: As directed      Allergies as of 04/11/2019   No Known Allergies     Medication List    TAKE these medications   acetaminophen 325 MG tablet Commonly known as: TYLENOL Take 650 mg by mouth every 6 (six) hours as needed for mild pain, moderate pain, fever or headache.   alendronate 70 MG tablet Commonly known as: FOSAMAX Take 1 tablet by mouth once a week. With 8 oz of water at least 30 minutes before food/meds. Do not lie down for 30 minutes after dose.   aspirin EC 81 MG tablet Take 81 mg by mouth daily.   atenolol 25 MG tablet Commonly known as: TENORMIN Take 75 mg by mouth daily.   atorvastatin 20 MG tablet Commonly known as: LIPITOR Take 1 tablet (20 mg total) by mouth daily.   cholecalciferol 25 MCG (1000 UNIT) tablet Commonly known as: VITAMIN D Take 2,000 Units by  mouth daily.   cinacalcet 30 MG tablet Commonly known as: SENSIPAR Take 30 mg by mouth every Monday, Wednesday, and Friday.   diphenhydrAMINE 50 MG capsule Commonly known as: BENADRYL Take  50 mg by mouth at bedtime.   enoxaparin 40 MG/0.4ML injection Commonly known as: LOVENOX Inject 0.4 mLs (40 mg total) into the skin daily for 14 doses.   glipiZIDE 2.5 MG 24 hr tablet Commonly known as: GLUCOTROL XL Take 2.5 mg by mouth daily with breakfast. *DO NOT CRUSH OR CHEW*   insulin detemir 100 unit/ml Soln Commonly known as: LEVEMIR Inject 0.16 mLs (16 Units total) into the skin daily.   lisinopril 10 MG tablet Commonly known as: ZESTRIL Take 1 tablet by mouth daily.   Magnesium Oxide 500 MG Tabs Take 1 tablet by mouth daily.   Melatonin 10 MG Caps Take 1 capsule by mouth at bedtime.   metFORMIN 500 MG (MOD) 24 hr tablet Commonly known as: GLUMETZA Take 1,000 mg by mouth 2 (two) times daily.   Nicotine 21-14-7 MG/24HR Kit Place 21 mg onto the skin daily.   oxybutynin 5 MG tablet Commonly known as: DITROPAN Take 5 mg by mouth 2 (two) times daily.   oxyCODONE 5 MG immediate release tablet Commonly known as: Oxy IR/ROXICODONE Take 0.5-1 tablets (2.5-5 mg total) by mouth every 4 (four) hours as needed for moderate pain or severe pain (pain score 4-6).   traMADol 50 MG tablet Commonly known as: ULTRAM Take 1 tablet (50 mg total) by mouth every 6 (six) hours as needed for moderate pain.   traZODone 50 MG tablet Commonly known as: DESYREL Take 25 mg by mouth at bedtime.   Vitamin D (Ergocalciferol) 1.25 MG (50000 UNIT) Caps capsule Commonly known as: DRISDOL Take 50,000 Units by mouth every 7 (seven) days.   zinc oxide 20 % ointment Apply 1 application topically 2 (two) times daily. Apply topically to buttocks twice daily for dermatitis       Contact information for follow-up providers    Reche Dixon, PA-C. Schedule an appointment as soon as possible for a visit on 04/25/2019.   Specialty: Orthopedic Surgery Why: For staple removal and hip x-rays;  @ 2:00 pm Contact information: [address] [place]  [address] [phone]            Contact information for after-discharge care    Destination    [facility] Preferred SNF.   Service: Skilled Nursing Contact information: [address] [place] [phone]                 No Known Allergies  Consultations:  Orthopedics   Procedures/Studies: CT Head Wo Contrast  Result Date: 04/08/2019 CLINICAL DATA:  86 year old dementia patient post unwitnessed fall. EXAM: CT HEAD WITHOUT CONTRAST TECHNIQUE: Contiguous axial images were obtained from the base of the skull through the vertex without intravenous contrast. COMPARISON:  Head CT 08/29/2015 FINDINGS: Brain: No intracranial hemorrhage, mass effect, or midline shift. Age related atrophy. No hydrocephalus. The basilar cisterns are patent. Moderate to advanced chronic small vessel ischemia. No evidence of territorial infarct or acute ischemia. No extra-axial or intracranial fluid collection. Vascular: Atherosclerosis of skullbase vasculature without hyperdense vessel or abnormal calcification. Skull: No fracture or focal lesion. Sinuses/Orbits: Paranasal sinuses and mastoid air cells are clear. The visualized orbits are unremarkable. Other: Small right parietal scalp hematoma. IMPRESSION: 1. Small right parietal scalp hematoma. No acute intracranial abnormality. No skull fracture. 2. Age  related atrophy and chronic small vessel ischemia. Electronically Signed   By: Keith Rake M.D.   On: 04/08/2019 01:47   CT Cervical Spine Wo Contrast  Result Date: 04/08/2019 CLINICAL DATA:  86 year old dementia patient post unwitnessed fall. EXAM: CT CERVICAL SPINE WITHOUT CONTRAST TECHNIQUE: Multidetector CT imaging of the cervical spine was performed without intravenous contrast. Multiplanar CT image reconstructions were also generated. COMPARISON:  08/29/2015 FINDINGS: Alignment: Reversal of normal lordosis. Minimal anterolisthesis of C3 on C4 and C7 on T1,  likely degenerative. No jumped or perched facets. Skull base and vertebrae: No acute fracture. Vertebral body heights are maintained. The dens and skull base are intact. Degenerative pannus at C1-C2. Soft tissues and spinal canal: No prevertebral fluid or swelling. No visible canal hematoma. Disc levels: Disc space narrowing and endplate spurring most prominent at C4-C5, C5-C6, and C6-C7. Multilevel facet hypertrophy. Degenerative changes are grossly stable from prior exam. Upper chest: Apical pleuroparenchymal scarring. No acute findings. Other: Carotid calcifications. IMPRESSION: Multilevel degenerative change throughout the cervical spine without acute fracture or subluxation. Electronically Signed   By: Keith Rake M.D.   On: 04/08/2019 01:50   CT Hip Right Wo Contrast  Result Date: 04/08/2019 CLINICAL DATA:  Greater trochanter fracture, evaluate for intratrochanteric extension EXAM: CT OF THE RIGHT HIP WITHOUT CONTRAST TECHNIQUE: Multidetector CT imaging of the right hip was performed according to the standard protocol. Multiplanar CT image reconstructions were also generated. COMPARISON:  None. FINDINGS: Bones/Joint/Cartilage There is a extensively comminuted mildly displaced greater trochanter fracture. There are fracture fragments seen superiorly and posteriorly displaced around the hip. There also appears to be a probable incomplete nondisplaced fracture seen through the superior femoral neck best seen on series 4, image 49. No intratrochanteric extension is noted. The femoral head is still well seated within the acetabulum. There is moderate to advanced femoroacetabular joint osteoarthritis. Diffuse osteopenia is noted. A small hip joint effusion is seen. Ligaments Suboptimally assessed by CT. Muscles and Tendons Edema is noted within the gluteal musculature. The remainder of the muscles appear to be grossly intact. Heterogeneous appearance to the insertion site of the gluteal tendons, likely a  partial disruption. The hamstrings tendons are intact. Soft tissues Subcutaneous edema and soft tissue swelling seen over the posterior lateral hip. There is a partially visualized right adnexal cystic mass measuring 4.0 x 2.9 by 3.6 cm. IMPRESSION: 1. Extensively comminuted mildly displaced greater trochanter fracture. No intertrochanteric extension. 2. There is also a incomplete nondisplaced fracture seen through the superior femoral neck. 3. 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 4. Diffuse gluteal musculature edema with a probable partial disruption of the gluteal tendon insertion site 5. Partially visualized nonspecific cystic mass within the right adnexa measuring 4.0 x 2.9 by 3.6 cm which could represent a lymphocele/peritoneal inclusion cyst or paraovarian cyst. If further evaluation is required would recommend CT abdomen pelvis with contrast when patient is stable. Electronically Signed   By: Prudencio Pair M.D.   On: 04/08/2019 03:44   DG Chest Port 1 View  Result Date: 04/10/2019 CLINICAL DATA:  Chest pain. EXAM: PORTABLE CHEST 1 VIEW COMPARISON:  Single-view of the chest 04/08/2019. PA and lateral chest 05/24/2015. FINDINGS: The lungs are clear. Heart size is normal. Atherosclerosis noted. No pneumothorax or pleural fluid. No acute or focal bony abnormality. IMPRESSION: No acute disease. Atherosclerosis. Electronically Signed   By: Inge Rise M.D.   On: 04/10/2019 14:49   DG Chest Port 1 View  Result Date: 04/08/2019 CLINICAL DATA:  Preop  EXAM: PORTABLE CHEST 1 VIEW COMPARISON:  May 24, 2015 FINDINGS: The heart size and mediastinal contours are within normal limits. Aortic knob calcifications. Both lungs are clear. No acute osseous abnormality. IMPRESSION: No active disease. Electronically Signed   By: Prudencio Pair M.D.   On: 04/08/2019 02:42   DG HIP OPERATIVE UNILAT W OR W/O PELVIS RIGHT  Result Date: 04/08/2019 CLINICAL DATA:  ORIF right greater trochanter fracture  EXAM: OPERATIVE RIGHT HIP WITH PELVIS COMPARISON:  04/08/2019 right hip radiographs FLUOROSCOPY TIME:  Fluoroscopy Time:  0 minutes 58 seconds Number of Acquired Spot Images: 4 FINDINGS: Multiple nondiagnostic spot fluoroscopic intraoperative right hip radiographs demonstrate 2 pins in the right femoral neck with interlocking intramedullary rod in the proximal right femoral shaft with distal interlocking screw. IMPRESSION: Intraoperative fluoroscopic guidance for ORIF right greater trochanter fracture. Electronically Signed   By: Ilona Sorrel M.D.   On: 04/08/2019 14:59   DG Hip Unilat W or Wo Pelvis 2-3 Views Right  Result Date: 04/08/2019 CLINICAL DATA:  Pain after unwitnessed fall EXAM: DG HIP (WITH OR WITHOUT PELVIS) 2-3V RIGHT COMPARISON:  January 01, 2015 FINDINGS: There is a comminuted mildly displaced right greater trochanter fracture. There is diffuse osteopenia. No other fractures identified. Moderate bilateral hip osteoarthritis is seen with superior joint space loss and marginal osteophyte formation. Degenerative changes in the lower lumbar spine. Soft tissue swelling and subcutaneous edema overlying the right hip. IMPRESSION: Comminuted mildly displaced right greater trochanter fracture. Electronically Signed   By: Prudencio Pair M.D.   On: 04/08/2019 01:38    (Echo, Carotid, EGD, Colonoscopy, ERCP)    Subjective: Patient seen and examined No complaints Stable for discharge  Discharge Exam: Vitals:   04/11/19 0740 04/11/19 1000  BP: 124/62 120/64  Pulse: 75   Resp: 20   Temp: 97.7 F (36.5 C)   SpO2: 97%    Vitals:   04/11/19 0002 04/11/19 0338 04/11/19 0740 04/11/19 1000  BP: (!) 121/54 (!) 126/55 124/62 120/64  Pulse: 80 78 75   Resp: 17 18 20    Temp: 98.2 F (36.8 C) 98 F (36.7 C) 97.7 F (36.5 C)   TempSrc: Oral Oral Oral   SpO2: 95% 97% 97%   Weight:      Height:        General: Pt is alert, awake, not in acute distress Cardiovascular: RRR, S1/S2 +, no rubs,  no gallops Respiratory: CTA bilaterally, no wheezing, no rhonchi Abdominal: Soft, NT, ND, bowel sounds + Extremities: no edema, no cyanosis    The results of significant diagnostics from this hospitalization (including imaging, microbiology, ancillary and laboratory) are listed below for reference.     Microbiology: Recent Results (from the past 240 hour(s))  Respiratory Panel by RT PCR (Flu A&B, Covid) - Nasopharyngeal Swab     Status: None   Collection Time: 04/08/19  2:35 AM   Specimen: Nasopharyngeal Swab  Result Value Ref Range Status   SARS Coronavirus 2 by RT PCR NEGATIVE NEGATIVE Final    Comment: (NOTE) SARS-CoV-2 target nucleic acids are NOT DETECTED. The SARS-CoV-2 RNA is generally detectable in upper respiratoy specimens during the acute phase of infection. The lowest concentration of SARS-CoV-2 viral copies this assay can detect is 131 copies/mL. A negative result does not preclude SARS-Cov-2 infection and should not be used as the sole basis for treatment or other patient management decisions. A negative result may occur with  improper specimen collection/handling, submission of specimen other than nasopharyngeal swab, presence of  viral mutation(s) within the areas targeted by this assay, and inadequate number of viral copies (<131 copies/mL). A negative result must be combined with clinical observations, patient history, and epidemiological information. The expected result is Negative. Fact Sheet for Patients:  PinkCheek.be Fact Sheet for Healthcare Providers:  GravelBags.it This test is not yet ap proved or cleared by the [place] FDA and  has been authorized for detection and/or diagnosis of SARS-CoV-2 by FDA under an Emergency Use Authorization (EUA). This EUA will remain  in effect (meaning this test can be used) for the duration of the COVID-19 declaration under Section 564(b)(1) of the Act, 21  U.S.C. section 360bbb-3(b)(1), unless the authorization is terminated or revoked sooner.    Influenza A by PCR NEGATIVE NEGATIVE Final   Influenza B by PCR NEGATIVE NEGATIVE Final    Comment: (NOTE) The Xpert Xpress SARS-CoV-2/FLU/RSV assay is intended as an aid in  the diagnosis of influenza from Nasopharyngeal swab specimens and  should not be used as a sole basis for treatment. Nasal washings and  aspirates are unacceptable for Xpert Xpress SARS-CoV-2/FLU/RSV  testing. Fact Sheet for Patients: PinkCheek.be Fact Sheet for Healthcare Providers: GravelBags.it This test is not yet approved or cleared by the [place] FDA and  has been authorized for detection and/or diagnosis of SARS-CoV-2 by  FDA under an Emergency Use Authorization (EUA). This EUA will remain  in effect (meaning this test can be used) for the duration of the  Covid-19 declaration under Section 564(b)(1) of the Act, 21  U.S.C. section 360bbb-3(b)(1), unless the authorization is  terminated or revoked. Performed at [facility], [address] [place], [place] [address]   Surgical pcr screen     Status: Abnormal   Collection Time: 04/08/19  6:23 AM   Specimen: Nasal Mucosa; Nasal Swab  Result Value Ref Range Status   MRSA, PCR NEGATIVE NEGATIVE Final   Staphylococcus aureus POSITIVE (A) NEGATIVE Final    Comment: (NOTE) The Xpert SA Assay (FDA approved for NASAL specimens in patients 36 years of age and older), is one component of a comprehensive surveillance program. It is not intended to diagnose infection nor to guide or monitor treatment. Performed at [facility], [address] [place], [place] [address]   Urine Culture     Status: Abnormal   Collection Time: 04/08/19 10:35 AM   Specimen: Urine, Random  Result Value Ref Range Status   Specimen Description   Final    URINE, RANDOM Performed at [facility],  [address], [place], [place] [address]    Special Requests   Final    Normal Performed at [facility], [address] [place],  [address]    Culture MULTIPLE SPECIES PRESENT, SUGGEST RECOLLECTION (A)  Final   Report Status 04/09/2019 FINAL  Final     Labs: BNP (last 3 results) No results for input(s): BNP in the last 8760 hours. Basic Metabolic Panel: Recent Labs  Lab 04/08/19 0055 04/09/19 0642 04/10/19 0501 04/11/19 0503  NA 136 134* 130* 131*  K 4.2 4.1 4.2 4.1  CL 99 104 98 97*  CO2 26 26 24 25   GLUCOSE 75 180* 144* 140*  BUN 22 17 11 13   CREATININE 0.76 0.57 0.50 0.49  CALCIUM 9.7 7.8* 8.0* 8.0*   Liver Function Tests: Recent Labs  Lab 04/08/19 0055  AST 25  ALT 18  ALKPHOS 201*  BILITOT 0.6  PROT 6.9  ALBUMIN 3.8   No results  for input(s): LIPASE, AMYLASE in the last 168 hours. No results for input(s): AMMONIA in the last 168 hours. CBC: Recent Labs  Lab 04/08/19 0055 04/09/19 0642 04/10/19 0501 04/11/19 0503  WBC 10.0 9.7 9.8 8.5  NEUTROABS 7.1  --   --   --   HGB 10.8* 7.7* 8.2* 8.0*  HCT 35.0* 23.9* 24.7* 24.8*  MCV 97.8 98.0 93.2 95.0  PLT 322 188 198 208   Cardiac Enzymes: No results for input(s): CKTOTAL, CKMB, CKMBINDEX, TROPONINI in the last 168 hours. BNP: Invalid input(s): POCBNP CBG: Recent Labs  Lab 04/10/19 1159 04/10/19 1658 04/10/19 2120 04/11/19 0737 04/11/19 1146  GLUCAP 91 139* 102* 114* 198*   D-Dimer No results for input(s): DDIMER in the last 72 hours. Hgb A1c Recent Labs    04/09/19 0642  HGBA1C 5.6   Lipid Profile No results for input(s): CHOL, HDL, LDLCALC, TRIG, CHOLHDL, LDLDIRECT in the last 72 hours. Thyroid function studies No results for input(s): TSH, T4TOTAL, T3FREE, THYROIDAB in the last 72 hours.  Invalid input(s): FREET3 Anemia work up No results for input(s): VITAMINB12, FOLATE, FERRITIN, TIBC, IRON, RETICCTPCT in the last 72 hours. Urinalysis    Component Value  Date/Time   COLORURINE YELLOW (A) 04/08/2019 1035   APPEARANCEUR HAZY (A) 04/08/2019 1035   APPEARANCEUR Clear 01/09/2013 1900   LABSPEC 1.017 04/08/2019 1035   LABSPEC 1.016 01/09/2013 1900   PHURINE 6.0 04/08/2019 1035   GLUCOSEU NEGATIVE 04/08/2019 1035   GLUCOSEU >=500 01/09/2013 1900   HGBUR LARGE (A) 04/08/2019 1035   BILIRUBINUR NEGATIVE 04/08/2019 1035   BILIRUBINUR Negative 01/09/2013 1900   KETONESUR NEGATIVE 04/08/2019 1035   PROTEINUR 30 (A) 04/08/2019 1035   NITRITE NEGATIVE 04/08/2019 1035   LEUKOCYTESUR NEGATIVE 04/08/2019 1035   LEUKOCYTESUR 1+ 01/09/2013 1900   Sepsis Labs Invalid input(s): PROCALCITONIN,  WBC,  LACTICIDVEN Microbiology Recent Results (from the past 240 hour(s))  Respiratory Panel by RT PCR (Flu A&B, Covid) - Nasopharyngeal Swab     Status: None   Collection Time: 04/08/19  2:35 AM   Specimen: Nasopharyngeal Swab  Result Value Ref Range Status   SARS Coronavirus 2 by RT PCR NEGATIVE NEGATIVE Final    Comment: (NOTE) SARS-CoV-2 target nucleic acids are NOT DETECTED. The SARS-CoV-2 RNA is generally detectable in upper respiratoy specimens during the acute phase of infection. The lowest concentration of SARS-CoV-2 viral copies this assay can detect is 131 copies/mL. A negative result does not preclude SARS-Cov-2 infection and should not be used as the sole basis for treatment or other patient management decisions. A negative result may occur with  improper specimen collection/handling, submission of specimen other than nasopharyngeal swab, presence of viral mutation(s) within the areas targeted by this assay, and inadequate number of viral copies (<131 copies/mL). A negative result must be combined with clinical observations, patient history, and epidemiological information. The expected result is Negative. Fact Sheet for Patients:  PinkCheek.be Fact Sheet for Healthcare Providers:   GravelBags.it This test is not yet ap proved or cleared by the [place] FDA and  has been authorized for detection and/or diagnosis of SARS-CoV-2 by FDA under an Emergency Use Authorization (EUA). This EUA will remain  in effect (meaning this test can be used) for the duration of the COVID-19 declaration under Section 564(b)(1) of the Act, 21 U.S.C. section 360bbb-3(b)(1), unless the authorization is terminated or revoked sooner.    Influenza A by PCR NEGATIVE NEGATIVE Final   Influenza B by PCR NEGATIVE NEGATIVE Final  Comment: (NOTE) The Xpert Xpress SARS-CoV-2/FLU/RSV assay is intended as an aid in  the diagnosis of influenza from Nasopharyngeal swab specimens and  should not be used as a sole basis for treatment. Nasal washings and  aspirates are unacceptable for Xpert Xpress SARS-CoV-2/FLU/RSV  testing. Fact Sheet for Patients: PinkCheek.be Fact Sheet for Healthcare Providers: GravelBags.it This test is not yet approved or cleared by the [place] FDA and  has been authorized for detection and/or diagnosis of SARS-CoV-2 by  FDA under an Emergency Use Authorization (EUA). This EUA will remain  in effect (meaning this test can be used) for the duration of the  Covid-19 declaration under Section 564(b)(1) of the Act, 21  U.S.C. section 360bbb-3(b)(1), unless the authorization is  terminated or revoked. Performed at [facility], [address] [place], [place] [address]   Surgical pcr screen     Status: Abnormal   Collection Time: 04/08/19  6:23 AM   Specimen: Nasal Mucosa; Nasal Swab  Result Value Ref Range Status   MRSA, PCR NEGATIVE NEGATIVE Final   Staphylococcus aureus POSITIVE (A) NEGATIVE Final    Comment: (NOTE) The Xpert SA Assay (FDA approved for NASAL specimens in patients 37 years of age and older), is one component of a comprehensive surveillance program.  It is not intended to diagnose infection nor to guide or monitor treatment. Performed at [facility], [address], [place], [place] [address]   Urine Culture     Status: Abnormal   Collection Time: 04/08/19 10:35 AM   Specimen: Urine, Random  Result Value Ref Range Status   Specimen Description   Final    URINE, RANDOM Performed at [facility], [address] [place], [place] [address]    Special Requests   Final    Normal Performed at [facility], [address] [place], [place] [address]    Culture MULTIPLE SPECIES PRESENT, SUGGEST RECOLLECTION (A)  Final   Report Status 04/09/2019 FINAL  Final     Time coordinating discharge: Over 30 minutes  SIGNED:   Sidney Ace, MD  [facility] 04/11/2019, 12:27 PM Pager   If 7PM-7AM, please contact night-coverage

## 2019-07-21 ENCOUNTER — Other Ambulatory Visit: Payer: Self-pay

## 2019-07-21 ENCOUNTER — Emergency Department
Admission: EM | Admit: 2019-07-21 | Discharge: 2019-07-21 | Disposition: A | Discharge status: Home / Self Care | Payer: Medicare Other | Attending: Emergency Medicine | Admitting: Emergency Medicine

## 2019-07-21 ENCOUNTER — Emergency Department: Payer: Medicare Other

## 2019-07-21 DIAGNOSIS — I10 Essential (primary) hypertension: Secondary | ICD-10-CM | POA: Diagnosis not present

## 2019-07-21 DIAGNOSIS — Z794 Long term (current) use of insulin: Secondary | ICD-10-CM | POA: Diagnosis not present

## 2019-07-21 DIAGNOSIS — Z7982 Long term (current) use of aspirin: Secondary | ICD-10-CM | POA: Insufficient documentation

## 2019-07-21 DIAGNOSIS — G309 Alzheimer's disease, unspecified: Secondary | ICD-10-CM | POA: Insufficient documentation

## 2019-07-21 DIAGNOSIS — E119 Type 2 diabetes mellitus without complications: Secondary | ICD-10-CM | POA: Diagnosis not present

## 2019-07-21 DIAGNOSIS — Z79899 Other long term (current) drug therapy: Secondary | ICD-10-CM | POA: Insufficient documentation

## 2019-07-21 DIAGNOSIS — Z043 Encounter for examination and observation following other accident: Secondary | ICD-10-CM | POA: Insufficient documentation

## 2019-07-21 DIAGNOSIS — W19XXXA Unspecified fall, initial encounter: Secondary | ICD-10-CM | POA: Diagnosis not present

## 2019-07-21 NOTE — ED Provider Notes (Signed)
[facility] Emergency Department Provider Note  ____________________________________________   First MD Initiated Contact with Patient 07/21/19 0245     (approximate)  I have reviewed the triage vital signs and the nursing notes.   HISTORY  Chief Complaint Fall    HPI Jenna Robinson is a 86 y.o. female with history of Alzheimer's here with fall.  Patient reportedly was found down after a suspected mechanical fall.  She was reportedly wandering around prior to this.  She denies any specific complaints on my assessment.  No recent medication changes.  She is at her mental baseline according the the facility.  Level 5 caveat invoked as remainder of history, ROS, and physical exam limited due to patient's dementia.         Past Medical History:  Diagnosis Date  . Alzheimer disease ([place])   . Diabetes mellitus without complication ([place])   . Hypercholesteremia   . Hypertension   . Hyperthyroidism     Patient Active Problem List   Diagnosis Date Noted  . Closed right hip fracture ([place]) 04/08/2019  . Hypercalcemia 05/22/2015  . Elevated troponin 01/02/2015  . Rhabdomyolysis 01/02/2015    Past Surgical History:  Procedure Laterality Date  . ABDOMINAL HYSTERECTOMY    . DILATION AND CURETTAGE, DIAGNOSTIC / THERAPEUTIC    . INTRAMEDULLARY (IM) NAIL INTERTROCHANTERIC Right 04/08/2019   Procedure: INTRAMEDULLARY (IM) NAIL INTERTROCHANTRIC;  Surgeon: Leim Fabry, MD;  Location: [facility] ORS;  Service: Orthopedics;  Laterality: Right;    Prior to Admission medications   Medication Sig Start Date End Date Taking? Authorizing Provider  acetaminophen (TYLENOL) 325 MG tablet Take 650 mg by mouth every 6 (six) hours as needed for mild pain, moderate pain, fever or headache.    [provider]  alendronate (FOSAMAX) 70 MG tablet Take 1 tablet by mouth once a week. With 8 oz of water at least 30 minutes before food/meds. Do not lie down for 30  minutes after dose. 11/11/16   [provider]  aspirin EC 81 MG tablet Take 81 mg by mouth daily.    [provider]  atenolol (TENORMIN) 25 MG tablet Take 75 mg by mouth daily.     [provider]  atorvastatin (LIPITOR) 20 MG tablet Take 1 tablet (20 mg total) by mouth daily. 01/05/15   Gladstone Lighter, MD  cholecalciferol (VITAMIN D) 25 MCG (1000 UNIT) tablet Take 2,000 Units by mouth daily.    [provider]  cinacalcet (SENSIPAR) 30 MG tablet Take 30 mg by mouth every Monday, Wednesday, and Friday.    [provider]  diphenhydrAMINE (BENADRYL) 50 MG capsule Take 50 mg by mouth at bedtime.    [provider]  enoxaparin (LOVENOX) 40 MG/0.4ML injection Inject 0.4 mLs (40 mg total) into the skin daily for 14 doses. 04/10/19 04/24/19  Reche Dixon, PA-C  glipiZIDE (GLUCOTROL XL) 2.5 MG 24 hr tablet Take 2.5 mg by mouth daily with breakfast. *DO NOT CRUSH OR CHEW*    [provider]  insulin detemir (LEVEMIR) 100 unit/ml SOLN Inject 0.16 mLs (16 Units total) into the skin daily. Patient not taking: Reported on 04/08/2019 01/04/15   Gladstone Lighter, MD  lisinopril (PRINIVIL,ZESTRIL) 10 MG tablet Take 1 tablet by mouth daily.    [provider]  Magnesium Oxide 500 MG TABS Take 1 tablet by mouth daily.    [provider]  Melatonin 10 MG CAPS Take 1 capsule by mouth at bedtime.    [provider]  metFORMIN (GLUMETZA) 500 MG (MOD) 24 hr tablet Take 1,000 mg by mouth 2 (two) times daily.     [provider]  Nicotine 21-14-7 MG/24HR KIT Place 21 mg onto the skin daily. Patient not taking: Reported on 05/22/2015 01/05/15   Gladstone Lighter, MD  oxybutynin (DITROPAN) 5 MG tablet Take 5 mg by mouth 2 (two) times daily.    [provider]  oxyCODONE (OXY IR/ROXICODONE) 5 MG immediate release tablet Take 0.5-1 tablets (2.5-5 mg total) by mouth every 4 (four) hours as needed for moderate pain or  severe pain (pain score 4-6). 04/10/19   Reche Dixon, PA-C  traMADol (ULTRAM) 50 MG tablet Take 1 tablet (50 mg total) by mouth every 6 (six) hours as needed for moderate pain. 04/10/19   Reche Dixon, PA-C  traZODone (DESYREL) 50 MG tablet Take 25 mg by mouth at bedtime.    [provider]  Vitamin D, Ergocalciferol, (DRISDOL) 50000 units CAPS capsule Take 50,000 Units by mouth every 7 (seven) days.    [provider]  zinc oxide 20 % ointment Apply 1 application topically 2 (two) times daily. Apply topically to buttocks twice daily for dermatitis    [provider]    Allergies Penicillins  Family History  Problem Relation Age of Onset  . Cancer Other        multiple family members including all siblings    Social History Social History   Tobacco Use  . Smoking status: Never Smoker  . Smokeless tobacco: Never Used  Substance Use Topics  . Alcohol use: No  . Drug use: Not on file    Review of Systems  Review of Systems  Unable to perform ROS: Dementia     ____________________________________________  PHYSICAL EXAM:      VITAL SIGNS: ED Triage Vitals [07/21/19 0231]  Enc Vitals Group     BP 113/68     Pulse Rate 65     Resp 16     Temp 98.4 F (36.9 C)     Temp Source Oral     SpO2 100 %     Weight 160 lb (72.6 kg)     Height 5' 11"  (1.803 m)     Head Circumference      Peak Flow      Pain Score 0     Pain Loc      Pain Edu?      Excl. in [place]?      Physical Exam Vitals and nursing note reviewed.  Constitutional:      General: She is not in acute distress.    Appearance: She is well-developed.  HENT:     Head: Normocephalic and atraumatic.     Comments: No significant evidence of head trauma.  No periorbital ecchymoses. Eyes:     Conjunctiva/sclera: Conjunctivae normal.  Cardiovascular:     Rate and Rhythm: Normal rate and regular rhythm.     Heart sounds: Normal heart sounds.  Pulmonary:     Effort: Pulmonary effort is  normal. No respiratory distress.     Breath sounds: No wheezing.  Abdominal:     General: There is no distension.  Musculoskeletal:     Cervical back: Neck supple.     Comments: Normal tenderness to bilateral hips.  No shortening or rotation.  Minimal tenderness over the sacrum without overt evidence of midline lumbar tenderness.  Skin:    General: Skin is warm.     Capillary Refill: Capillary  refill takes less than 2 seconds.     Findings: No rash.  Neurological:     Mental Status: She is alert. Mental status is at baseline. She is disoriented.     Motor: No abnormal muscle tone.       ____________________________________________   LABS (all labs ordered are listed, but only abnormal results are displayed)  Labs Reviewed - No data to display  ____________________________________________  EKG:  ________________________________________  RADIOLOGY All imaging, including plain films, CT scans, and ultrasounds, independently reviewed by me, and interpretations confirmed via formal radiology reads.  ED MD interpretation:   Lumbar spine: Degenerative changes, no acute abnormality CT head: No acute intracranial abnormality Hip films bilaterally: Negative  Official radiology report(s): DG Lumbar Spine 2-3 Views  Result Date: 07/21/2019 CLINICAL DATA:  Recent fall with low back pain, initial encounter EXAM: LUMBAR SPINE - 3 VIEW COMPARISON:  None. FINDINGS: Five lumbar type vertebral bodies are well visualized. Scoliosis concave to the right is noted. Aortic calcifications are seen. Multilevel osteophytic changes and facet hypertrophic changes are noted. No compression deformity is seen. Disc space narrowing is noted is seen. IMPRESSION: Multilevel degenerative change without acute abnormality. Electronically Signed   By: Inez Catalina M.D.   On: 07/21/2019 03:46   CT Head Wo Contrast  Result Date: 07/21/2019 CLINICAL DATA:  Recent fall with headaches, initial encounter EXAM: CT HEAD  WITHOUT CONTRAST TECHNIQUE: Contiguous axial images were obtained from the base of the skull through the vertex without intravenous contrast. COMPARISON:  04/08/2019 FINDINGS: Brain: Chronic atrophic and ischemic changes are again seen and stable. No findings to suggest acute hemorrhage, acute infarction or space-occupying mass lesion are noted. Vascular: No hyperdense vessel or unexpected calcification. Skull: Normal. Negative for fracture or focal lesion. Sinuses/Orbits: No acute finding. Other: None. IMPRESSION: Chronic atrophic and ischemic changes similar that seen on the prior exam. Electronically Signed   By: Inez Catalina M.D.   On: 07/21/2019 03:48   DG HIP UNILAT WITH PELVIS 2-3 VIEWS LEFT  Result Date: 07/21/2019 CLINICAL DATA:  Recent fall with left hip pain, initial encounter EXAM: DG HIP (WITH OR WITHOUT PELVIS) 2-3V LEFT COMPARISON:  None. FINDINGS: Visualized pelvic ring is intact. No acute fracture or dislocation is noted. No soft tissue abnormality is seen. IMPRESSION: No acute abnormality noted. Electronically Signed   By: Inez Catalina M.D.   On: 07/21/2019 03:41   DG HIP UNILAT WITH PELVIS 2-3 VIEWS RIGHT  Result Date: 07/21/2019 CLINICAL DATA:  Un witnessed fall with right hip pain, initial encounter EXAM: DG HIP (WITH OR WITHOUT PELVIS) 3V RIGHT COMPARISON:  None. FINDINGS: Pelvic ring is intact. Postsurgical changes in the proximal right femur seen. Previously seen greater trochanter fracture is again noted and stable. IMPRESSION: Status post ORIF of proximal right femoral fracture. No acute abnormality noted. Electronically Signed   By: Inez Catalina M.D.   On: 07/21/2019 03:45    ____________________________________________  PROCEDURES   Procedure(s) performed (including Critical Care):  Procedures  ____________________________________________  INITIAL IMPRESSION / MDM / [place] / ED COURSE  As part of my medical decision making, I reviewed the following data  within the [place] notes reviewed and incorporated, Old chart reviewed, Notes from prior ED visits, and [place] Controlled Substance Database       *Jenna Robinson was evaluated in Emergency Department on 07/21/2019 for the symptoms described in the history of present illness. She was evaluated in the context of the  global COVID-19 pandemic, which necessitated consideration that the patient might be at risk for infection with the SARS-CoV-2 virus that causes COVID-19. Institutional protocols and algorithms that pertain to the evaluation of patients at risk for COVID-19 are in a state of rapid change based on information released by regulatory bodies including the CDC and federal and state organizations. These policies and algorithms were followed during the patient's care in the ED.  Some ED evaluations and interventions may be delayed as a result of limited staffing during the pandemic.*     Medical Decision Making: 86 year old female here with mechanical fall.  She is at her mental baseline.  Imaging shows no evidence of injury.  Vital signs are stable.  Discharged back to facility.  ____________________________________________  FINAL CLINICAL IMPRESSION(S) / ED DIAGNOSES  Final diagnoses:  Fall, initial encounter     MEDICATIONS GIVEN DURING THIS VISIT:  Medications - No data to display   ED Discharge Orders    None       Note:  This document was prepared using Dragon voice recognition software and may include unintentional dictation errors.   Duffy Bruce, MD 07/21/19 [phone]

## 2019-07-21 NOTE — ED Notes (Signed)
Pt cleansed of urine incontinence by this RN. Repostioned in bed by this RN and Marylene Land, Charity fundraiser. Pt tolerated well. Lights dimmed for patient comfort. Pt denies any needs. Pt denies any pain. Fall risk measures remain in place at this time.

## 2019-07-21 NOTE — ED Notes (Signed)
This RN to bedside, introduced self to patient. Updated patient on plan of care. Pt states understanding. Yellow socks, fall bracelet, and posey alarm in place due to patient's high fall risk. Pt alert, states understanding to use call bell. Pt denies any needs at this time.

## 2019-07-21 NOTE — ED Notes (Signed)
Pt provided with meal tray at this time. Continue to await EMS arrival to transport patient back to facility.

## 2019-07-21 NOTE — ED Triage Notes (Addendum)
Pt comes [facility] from [facility] w cc of unwitnessed fall. Staff report finding pt on ground after pt was wandering around. Pt denies pain. Hx dementia.

## 2019-07-21 NOTE — ED Notes (Signed)
Pt cleansed of urine incontinence, repositioned in bed at this time. Pt provided with apple sauce at this time to eat per her request. Denies wanting anything to drink.

## 2019-07-21 NOTE — ED Notes (Signed)
NAD noted at time of D/C. Pt transported back to facility via [facility] at this time. Care Handoff given to [place], from [facility].

## 2019-08-21 ENCOUNTER — Inpatient Hospital Stay
Admission: EM | Admit: 2019-08-21 | Discharge: 2019-08-25 | DRG: 871 | Disposition: A | Discharge status: Skilled Nursing Facility | Payer: Medicare Other | Source: Skilled Nursing Facility | Attending: Internal Medicine | Admitting: Internal Medicine

## 2019-08-21 ENCOUNTER — Other Ambulatory Visit: Payer: Self-pay

## 2019-08-21 ENCOUNTER — Encounter: Payer: Self-pay | Admitting: Emergency Medicine

## 2019-08-21 ENCOUNTER — Emergency Department: Payer: Medicare Other

## 2019-08-21 DIAGNOSIS — Z66 Do not resuscitate: Secondary | ICD-10-CM | POA: Diagnosis present

## 2019-08-21 DIAGNOSIS — R638 Other symptoms and signs concerning food and fluid intake: Secondary | ICD-10-CM

## 2019-08-21 DIAGNOSIS — Z681 Body mass index (BMI) 19 or less, adult: Secondary | ICD-10-CM

## 2019-08-21 DIAGNOSIS — F0280 Dementia in other diseases classified elsewhere without behavioral disturbance: Secondary | ICD-10-CM | POA: Diagnosis present

## 2019-08-21 DIAGNOSIS — E872 Acidosis, unspecified: Secondary | ICD-10-CM

## 2019-08-21 DIAGNOSIS — R627 Adult failure to thrive: Secondary | ICD-10-CM | POA: Diagnosis present

## 2019-08-21 DIAGNOSIS — F0390 Unspecified dementia without behavioral disturbance: Secondary | ICD-10-CM

## 2019-08-21 DIAGNOSIS — N390 Urinary tract infection, site not specified: Secondary | ICD-10-CM

## 2019-08-21 DIAGNOSIS — E0590 Thyrotoxicosis, unspecified without thyrotoxic crisis or storm: Secondary | ICD-10-CM | POA: Diagnosis present

## 2019-08-21 DIAGNOSIS — E876 Hypokalemia: Secondary | ICD-10-CM | POA: Diagnosis present

## 2019-08-21 DIAGNOSIS — Z794 Long term (current) use of insulin: Secondary | ICD-10-CM

## 2019-08-21 DIAGNOSIS — G309 Alzheimer's disease, unspecified: Secondary | ICD-10-CM | POA: Diagnosis present

## 2019-08-21 DIAGNOSIS — Z20822 Contact with and (suspected) exposure to covid-19: Secondary | ICD-10-CM | POA: Diagnosis present

## 2019-08-21 DIAGNOSIS — R32 Unspecified urinary incontinence: Secondary | ICD-10-CM | POA: Diagnosis present

## 2019-08-21 DIAGNOSIS — G9341 Metabolic encephalopathy: Secondary | ICD-10-CM

## 2019-08-21 DIAGNOSIS — B9620 Unspecified Escherichia coli [E. coli] as the cause of diseases classified elsewhere: Secondary | ICD-10-CM | POA: Diagnosis present

## 2019-08-21 DIAGNOSIS — R6251 Failure to thrive (child): Secondary | ICD-10-CM

## 2019-08-21 DIAGNOSIS — E119 Type 2 diabetes mellitus without complications: Secondary | ICD-10-CM

## 2019-08-21 DIAGNOSIS — E860 Dehydration: Secondary | ICD-10-CM

## 2019-08-21 DIAGNOSIS — E7800 Pure hypercholesterolemia, unspecified: Secondary | ICD-10-CM | POA: Diagnosis present

## 2019-08-21 DIAGNOSIS — I10 Essential (primary) hypertension: Secondary | ICD-10-CM | POA: Diagnosis present

## 2019-08-21 DIAGNOSIS — E861 Hypovolemia: Secondary | ICD-10-CM | POA: Diagnosis present

## 2019-08-21 DIAGNOSIS — Z7983 Long term (current) use of bisphosphonates: Secondary | ICD-10-CM

## 2019-08-21 DIAGNOSIS — Z7982 Long term (current) use of aspirin: Secondary | ICD-10-CM

## 2019-08-21 DIAGNOSIS — E43 Unspecified severe protein-calorie malnutrition: Secondary | ICD-10-CM | POA: Insufficient documentation

## 2019-08-21 DIAGNOSIS — A419 Sepsis, unspecified organism: Secondary | ICD-10-CM

## 2019-08-21 DIAGNOSIS — A4151 Sepsis due to Escherichia coli [E. coli]: Secondary | ICD-10-CM | POA: Diagnosis not present

## 2019-08-21 DIAGNOSIS — Z79899 Other long term (current) drug therapy: Secondary | ICD-10-CM

## 2019-08-21 DIAGNOSIS — Z880 Allergy status to penicillin: Secondary | ICD-10-CM

## 2019-08-21 LAB — CBC WITH DIFFERENTIAL/PLATELET
Abs Immature Granulocytes: 0.03 10*3/uL (ref 0.00–0.07)
Basophils Absolute: 0 10*3/uL (ref 0.0–0.1)
Basophils Relative: 0 %
Eosinophils Absolute: 0.4 10*3/uL (ref 0.0–0.5)
Eosinophils Relative: 4 %
HCT: 41.7 % (ref 36.0–46.0)
Hemoglobin: 13.6 g/dL (ref 12.0–15.0)
Immature Granulocytes: 0 %
Lymphocytes Relative: 21 %
Lymphs Abs: 1.9 10*3/uL (ref 0.7–4.0)
MCH: 29.8 pg (ref 26.0–34.0)
MCHC: 32.6 g/dL (ref 30.0–36.0)
MCV: 91.2 fL (ref 80.0–100.0)
Monocytes Absolute: 0.6 10*3/uL (ref 0.1–1.0)
Monocytes Relative: 7 %
Neutro Abs: 6.2 10*3/uL (ref 1.7–7.7)
Neutrophils Relative %: 68 %
Platelets: 236 10*3/uL (ref 150–400)
RBC: 4.57 MIL/uL (ref 3.87–5.11)
RDW: 14.6 % (ref 11.5–15.5)
WBC: 9.1 10*3/uL (ref 4.0–10.5)
nRBC: 0 % (ref 0.0–0.2)

## 2019-08-21 LAB — COMPREHENSIVE METABOLIC PANEL
ALT: 23 U/L (ref 0–44)
AST: 36 U/L (ref 15–41)
Albumin: 4.4 g/dL (ref 3.5–5.0)
Alkaline Phosphatase: 53 U/L (ref 38–126)
Anion gap: 17 — ABNORMAL HIGH (ref 5–15)
BUN: 33 mg/dL — ABNORMAL HIGH (ref 8–23)
CO2: 28 mmol/L (ref 22–32)
Calcium: 12.5 mg/dL — ABNORMAL HIGH (ref 8.9–10.3)
Chloride: 91 mmol/L — ABNORMAL LOW (ref 98–111)
Creatinine, Ser: 0.94 mg/dL (ref 0.44–1.00)
GFR calc Af Amer: >60 mL/min (ref >60)
GFR calc non Af Amer: 53 mL/min — ABNORMAL LOW (ref >60)
Glucose, Bld: 115 mg/dL — ABNORMAL HIGH (ref 70–99)
Potassium: 4.5 mmol/L (ref 3.5–5.1)
Sodium: 136 mmol/L (ref 135–145)
Total Bilirubin: 0.9 mg/dL (ref 0.3–1.2)
Total Protein: 7.5 g/dL (ref 6.5–8.1)

## 2019-08-21 LAB — RESP PANEL BY RT PCR (RSV, FLU A&B, COVID)
Influenza A by PCR: NEGATIVE
Influenza B by PCR: NEGATIVE
Respiratory Syncytial Virus by PCR: NEGATIVE
SARS Coronavirus 2 by RT PCR: NEGATIVE

## 2019-08-21 LAB — LACTIC ACID, PLASMA: Lactic Acid, Venous: 9.1 mmol/L (ref 0.5–1.9)

## 2019-08-21 MED ORDER — SODIUM CHLORIDE 0.9 % IV BOLUS
1000.0000 mL | Freq: Once | INTRAVENOUS | Status: AC
  Administered 2019-08-21: 1000 mL via INTRAVENOUS

## 2019-08-21 NOTE — ED Triage Notes (Signed)
Pt via ems from [facility] [phone]).Per christi at ALF, pt has not been eating or drinking and has "not been herself" for about a week. Pt normally gets herself up and walks, but has not been doing so for the last week. Pt alert/rousable, has dementia.

## 2019-08-21 NOTE — ED Provider Notes (Signed)
[name] Emergency Department Provider Note  Time seen: 10:03 PM  I have reviewed the triage vital signs and the nursing notes.   HISTORY  Chief Complaint Failure To Thrive   HPI Jenna Robinson is a 86 y.o. female with a past medical history of dementia, diabetes, hypertension, hyperlipidemia presents to the emergency department for somnolence and failure to thrive.  According EMS report patient lives at [facility] at baseline the patient normally helps get herself ready is interactive and talkative.  They state over the past 1 week she has had a decline and is now not eating or drinking and is sleeping throughout the day.  Here patient is somnolent but does awaken to voice and will speak to me.  Denies any pain.   Past Medical History:  Diagnosis Date  . Alzheimer disease ([place])   . Diabetes mellitus without complication ([place])   . Hypercholesteremia   . Hypertension   . Hyperthyroidism     Patient Active Problem List   Diagnosis Date Noted  . Closed right hip fracture ([place]) 04/08/2019  . Hypercalcemia 05/22/2015  . Elevated troponin 01/02/2015  . Rhabdomyolysis 01/02/2015    Past Surgical History:  Procedure Laterality Date  . ABDOMINAL HYSTERECTOMY    . DILATION AND CURETTAGE, DIAGNOSTIC / THERAPEUTIC    . INTRAMEDULLARY (IM) NAIL INTERTROCHANTERIC Right 04/08/2019   Procedure: INTRAMEDULLARY (IM) NAIL INTERTROCHANTRIC;  Surgeon: Leim Fabry, MD;  Location: [facility] ORS;  Service: Orthopedics;  Laterality: Right;    Prior to Admission medications   Medication Sig Start Date End Date Taking? Authorizing Provider  acetaminophen (TYLENOL) 325 MG tablet Take 650 mg by mouth every 6 (six) hours as needed for mild pain, moderate pain, fever or headache.    [provider]  alendronate (FOSAMAX) 70 MG tablet Take 1 tablet by mouth once a week. With 8 oz of water at least 30 minutes before food/meds. Do not lie down  for 30 minutes after dose. 11/11/16   [provider]  aspirin EC 81 MG tablet Take 81 mg by mouth daily.    [provider]  atenolol (TENORMIN) 25 MG tablet Take 75 mg by mouth daily.     [provider]  atorvastatin (LIPITOR) 20 MG tablet Take 1 tablet (20 mg total) by mouth daily. 01/05/15   Gladstone Lighter, MD  cholecalciferol (VITAMIN D) 25 MCG (1000 UNIT) tablet Take 2,000 Units by mouth daily.    [provider]  cinacalcet (SENSIPAR) 30 MG tablet Take 30 mg by mouth every Monday, Wednesday, and Friday.    [provider]  diphenhydrAMINE (BENADRYL) 50 MG capsule Take 50 mg by mouth at bedtime.    [provider]  enoxaparin (LOVENOX) 40 MG/0.4ML injection Inject 0.4 mLs (40 mg total) into the skin daily for 14 doses. 04/10/19 04/24/19  Reche Dixon, PA-C  glipiZIDE (GLUCOTROL XL) 2.5 MG 24 hr tablet Take 2.5 mg by mouth daily with breakfast. *DO NOT CRUSH OR CHEW*    [provider]  insulin detemir (LEVEMIR) 100 unit/ml SOLN Inject 0.16 mLs (16 Units total) into the skin daily. Patient not taking: Reported on 04/08/2019 01/04/15   Gladstone Lighter, MD  lisinopril (PRINIVIL,ZESTRIL) 10 MG tablet Take 1 tablet by mouth daily.    [provider]  Magnesium Oxide 500 MG TABS Take 1 tablet by mouth daily.    [provider]  Melatonin 10 MG CAPS Take 1 capsule by mouth at bedtime.  [provider]  metFORMIN (GLUMETZA) 500 MG (MOD) 24 hr tablet Take 1,000 mg by mouth 2 (two) times daily.     [provider]  Nicotine 21-14-7 MG/24HR KIT Place 21 mg onto the skin daily. Patient not taking: Reported on 05/22/2015 01/05/15   Gladstone Lighter, MD  oxybutynin (DITROPAN) 5 MG tablet Take 5 mg by mouth 2 (two) times daily.    [provider]  oxyCODONE (OXY IR/ROXICODONE) 5 MG immediate release tablet Take 0.5-1 tablets (2.5-5 mg total) by mouth every 4 (four) hours as needed for moderate  pain or severe pain (pain score 4-6). 04/10/19   Reche Dixon, PA-C  traMADol (ULTRAM) 50 MG tablet Take 1 tablet (50 mg total) by mouth every 6 (six) hours as needed for moderate pain. 04/10/19   Reche Dixon, PA-C  traZODone (DESYREL) 50 MG tablet Take 25 mg by mouth at bedtime.    [provider]  Vitamin D, Ergocalciferol, (DRISDOL) 50000 units CAPS capsule Take 50,000 Units by mouth every 7 (seven) days.    [provider]  zinc oxide 20 % ointment Apply 1 application topically 2 (two) times daily. Apply topically to buttocks twice daily for dermatitis    [provider]    Allergies  Allergen Reactions  . Penicillins Itching    Family History  Problem Relation Age of Onset  . Cancer Other        multiple family members including all siblings    Social History Social History   Tobacco Use  . Smoking status: Never Smoker  . Smokeless tobacco: Never Used  Substance Use Topics  . Alcohol use: No  . Drug use: Not on file    Review of Systems Unable to obtain adequate/accurate review of systems secondary to baseline dementia. ____________________________________________   PHYSICAL EXAM:  VITAL SIGNS: ED Triage Vitals [08/21/19 1816]  Enc Vitals Group     BP (!) 144/66     Pulse Rate 66     Resp 16     Temp 98.1 F (36.7 C)     Temp Source Oral     SpO2 96 %     Weight 130 lb (59 kg)     Height 5' 9"  (1.753 m)     Head Circumference      Peak Flow      Pain Score      Pain Loc      Pain Edu?      Excl. in [place]?    Constitutional: Patient is somnolent but awakens to voice.  Answers questions.  No acute distress. Eyes: Normal exam ENT      Head: Normocephalic and atraumatic.      Mouth/Throat: Very dry appearing mucous membranes. Cardiovascular: Normal rate, regular rhythm.  Respiratory: Normal respiratory effort without tachypnea nor retractions. Breath sounds are clear  Gastrointestinal: Soft and nontender. No distention.    Musculoskeletal: Nontender with normal range of motion in all extremities.  Neurologic:  Normal speech and language. No gross focal neurologic deficits  Skin:  Skin is warm, dry and intact.  Psychiatric: Mood and affect are normal.    RADIOLOGY  Chest x-ray is negative.  ____________________________________________   INITIAL IMPRESSION / ASSESSMENT AND PLAN / ED COURSE  Pertinent labs & imaging results that were available during my care of the patient were reviewed by me and considered in my medical decision making (see chart for details).   Patient presents emergency department for generalized weakness and failure to thrive.  Per EMS report over the past 1 week patient has become less responsive now and not eating or drinking.  Patient appears quite dehydrated with dry mucous membranes.  We will check labs, IV hydrate and continue to closely monitor.  Patient's labs show a lactic acid of 9.1, otherwise largely nonrevealing labs plus a anion gap of 17 consistent with dehydration.  We will dose 1 L of IV fluids and recheck her lactic.  Urinalysis pending, Covid test pending.  Patient care signed out to oncoming provider.  Oberia Beaudoin was evaluated in Emergency Department on 08/21/2019 for the symptoms described in the history of present illness. She was evaluated in the context of the global COVID-19 pandemic, which necessitated consideration that the patient might be at risk for infection with the SARS-CoV-2 virus that causes COVID-19. Institutional protocols and algorithms that pertain to the evaluation of patients at risk for COVID-19 are in a state of rapid change based on information released by regulatory bodies including the CDC and federal and state organizations. These policies and algorithms were followed during the patient's care in the ED.  ____________________________________________   FINAL CLINICAL IMPRESSION(S) / ED DIAGNOSES  Dehydration Failure to thrive      Harvest Dark, MD 08/21/19 2336

## 2019-08-21 NOTE — ED Notes (Signed)
Patient's nurse from the facility called and is concerned about the patient having a UTI and wants the urine tested. Patient was repositioned on the recliner and given a pillow and warm blanket.

## 2019-08-21 NOTE — ED Notes (Signed)
Patient remains in recliner from triage per Dr. Lenard Lance. Patient is calm and cooperative at this time and appears comfortable.

## 2019-08-21 NOTE — ED Provider Notes (Signed)
MSE was initiated and I personally evaluated the patient and placed orders (if any) at  6:16 PM on August 21, 2019.  The patient appears stable so that the remainder of the MSE may be completed by another provider.   Chinita Pester, FNP 08/21/19 1851    Minna Antis, MD 08/21/19 2317

## 2019-08-21 NOTE — ED Notes (Signed)
Report given to Georgie RN 

## 2019-08-22 ENCOUNTER — Encounter: Payer: Self-pay | Admitting: Internal Medicine

## 2019-08-22 ENCOUNTER — Other Ambulatory Visit: Payer: Self-pay

## 2019-08-22 DIAGNOSIS — Z880 Allergy status to penicillin: Secondary | ICD-10-CM | POA: Diagnosis not present

## 2019-08-22 DIAGNOSIS — R32 Unspecified urinary incontinence: Secondary | ICD-10-CM | POA: Diagnosis present

## 2019-08-22 DIAGNOSIS — Z20822 Contact with and (suspected) exposure to covid-19: Secondary | ICD-10-CM | POA: Diagnosis present

## 2019-08-22 DIAGNOSIS — F0280 Dementia in other diseases classified elsewhere without behavioral disturbance: Secondary | ICD-10-CM

## 2019-08-22 DIAGNOSIS — E861 Hypovolemia: Secondary | ICD-10-CM | POA: Diagnosis present

## 2019-08-22 DIAGNOSIS — E876 Hypokalemia: Secondary | ICD-10-CM | POA: Diagnosis present

## 2019-08-22 DIAGNOSIS — G309 Alzheimer's disease, unspecified: Secondary | ICD-10-CM | POA: Diagnosis present

## 2019-08-22 DIAGNOSIS — E43 Unspecified severe protein-calorie malnutrition: Secondary | ICD-10-CM | POA: Diagnosis present

## 2019-08-22 DIAGNOSIS — G9341 Metabolic encephalopathy: Secondary | ICD-10-CM | POA: Diagnosis present

## 2019-08-22 DIAGNOSIS — Z681 Body mass index (BMI) 19 or less, adult: Secondary | ICD-10-CM | POA: Diagnosis not present

## 2019-08-22 DIAGNOSIS — R638 Other symptoms and signs concerning food and fluid intake: Secondary | ICD-10-CM

## 2019-08-22 DIAGNOSIS — E872 Acidosis, unspecified: Secondary | ICD-10-CM

## 2019-08-22 DIAGNOSIS — N390 Urinary tract infection, site not specified: Secondary | ICD-10-CM

## 2019-08-22 DIAGNOSIS — E860 Dehydration: Secondary | ICD-10-CM | POA: Diagnosis present

## 2019-08-22 DIAGNOSIS — R627 Adult failure to thrive: Secondary | ICD-10-CM | POA: Diagnosis present

## 2019-08-22 DIAGNOSIS — Z66 Do not resuscitate: Secondary | ICD-10-CM | POA: Diagnosis present

## 2019-08-22 DIAGNOSIS — A4151 Sepsis due to Escherichia coli [E. coli]: Secondary | ICD-10-CM | POA: Diagnosis present

## 2019-08-22 DIAGNOSIS — A419 Sepsis, unspecified organism: Secondary | ICD-10-CM

## 2019-08-22 DIAGNOSIS — Z7983 Long term (current) use of bisphosphonates: Secondary | ICD-10-CM | POA: Diagnosis not present

## 2019-08-22 DIAGNOSIS — F0390 Unspecified dementia without behavioral disturbance: Secondary | ICD-10-CM

## 2019-08-22 DIAGNOSIS — E119 Type 2 diabetes mellitus without complications: Secondary | ICD-10-CM | POA: Diagnosis present

## 2019-08-22 DIAGNOSIS — I10 Essential (primary) hypertension: Secondary | ICD-10-CM

## 2019-08-22 DIAGNOSIS — B9620 Unspecified Escherichia coli [E. coli] as the cause of diseases classified elsewhere: Secondary | ICD-10-CM | POA: Diagnosis present

## 2019-08-22 DIAGNOSIS — E7800 Pure hypercholesterolemia, unspecified: Secondary | ICD-10-CM | POA: Diagnosis present

## 2019-08-22 LAB — HEMOGLOBIN A1C
Hgb A1c MFr Bld: 6 % — ABNORMAL HIGH (ref 4.8–5.6)
Mean Plasma Glucose: 125.5 mg/dL

## 2019-08-22 LAB — URINALYSIS, COMPLETE (UACMP) WITH MICROSCOPIC
Bilirubin Urine: NEGATIVE
Glucose, UA: NEGATIVE mg/dL
Ketones, ur: 5 mg/dL — AB
Nitrite: POSITIVE — AB
Protein, ur: NEGATIVE mg/dL
Specific Gravity, Urine: 1.015 (ref 1.005–1.030)
pH: 5 (ref 5.0–8.0)

## 2019-08-22 LAB — BASIC METABOLIC PANEL
Anion gap: 13 (ref 5–15)
BUN: 28 mg/dL — ABNORMAL HIGH (ref 8–23)
CO2: 29 mmol/L (ref 22–32)
Calcium: 11.9 mg/dL — ABNORMAL HIGH (ref 8.9–10.3)
Chloride: 94 mmol/L — ABNORMAL LOW (ref 98–111)
Creatinine, Ser: 0.85 mg/dL (ref 0.44–1.00)
GFR calc Af Amer: >60 mL/min (ref >60)
GFR calc non Af Amer: 60 mL/min — ABNORMAL LOW (ref >60)
Glucose, Bld: 110 mg/dL — ABNORMAL HIGH (ref 70–99)
Potassium: 3.8 mmol/L (ref 3.5–5.1)
Sodium: 136 mmol/L (ref 135–145)

## 2019-08-22 LAB — CBC
HCT: 41.4 % (ref 36.0–46.0)
Hemoglobin: 13.6 g/dL (ref 12.0–15.0)
MCH: 29.8 pg (ref 26.0–34.0)
MCHC: 32.9 g/dL (ref 30.0–36.0)
MCV: 90.8 fL (ref 80.0–100.0)
Platelets: 221 10*3/uL (ref 150–400)
RBC: 4.56 MIL/uL (ref 3.87–5.11)
RDW: 14.6 % (ref 11.5–15.5)
WBC: 8.8 10*3/uL (ref 4.0–10.5)
nRBC: 0 % (ref 0.0–0.2)

## 2019-08-22 LAB — GLUCOSE, CAPILLARY
Glucose-Capillary: 90 mg/dL (ref 70–99)
Glucose-Capillary: 110 mg/dL — ABNORMAL HIGH (ref 70–99)
Glucose-Capillary: 82 mg/dL (ref 70–99)
Glucose-Capillary: 111 mg/dL — ABNORMAL HIGH (ref 70–99)

## 2019-08-22 LAB — CORTISOL-AM, BLOOD: Cortisol - AM: 13.3 ug/dL (ref 6.7–22.6)

## 2019-08-22 LAB — PROTIME-INR
INR: 1 (ref 0.8–1.2)
INR: 1.1 (ref 0.8–1.2)
Prothrombin Time: 12.3 seconds (ref 11.4–15.2)
Prothrombin Time: 13.4 seconds (ref 11.4–15.2)

## 2019-08-22 LAB — PROCALCITONIN: Procalcitonin: 0.26 ng/mL

## 2019-08-22 LAB — LACTIC ACID, PLASMA
Lactic Acid, Venous: 1.8 mmol/L (ref 0.5–1.9)
Lactic Acid, Venous: 6.1 mmol/L (ref 0.5–1.9)

## 2019-08-22 LAB — APTT: aPTT: 39 seconds — ABNORMAL HIGH (ref 24–36)

## 2019-08-22 MED ORDER — LISINOPRIL 10 MG PO TABS
10.0000 mg | ORAL_TABLET | Freq: Every day | ORAL | Status: DC
  Administered 2019-08-22 – 2019-08-25 (×4): 10 mg via ORAL
  Filled 2019-08-22 (×4): qty 1

## 2019-08-22 MED ORDER — INSULIN ASPART 100 UNIT/ML ~~LOC~~ SOLN: 0.0000 [IU] | Freq: Every day | SUBCUTANEOUS | Status: DC

## 2019-08-22 MED ORDER — SODIUM CHLORIDE 0.9 % IV BOLUS (SEPSIS)
500.0000 mL | Freq: Once | INTRAVENOUS | Status: AC
  Administered 2019-08-22: 500 mL via INTRAVENOUS

## 2019-08-22 MED ORDER — SODIUM CHLORIDE 0.9 % IV SOLN
1.0000 g | Freq: Once | INTRAVENOUS | Status: AC
  Administered 2019-08-22: 1 g via INTRAVENOUS
  Filled 2019-08-22: qty 10

## 2019-08-22 MED ORDER — CINACALCET HCL 30 MG PO TABS
30.0000 mg | ORAL_TABLET | ORAL | Status: DC
  Administered 2019-08-23 – 2019-08-25 (×2): 30 mg via ORAL
  Filled 2019-08-22 (×2): qty 1

## 2019-08-22 MED ORDER — INSULIN ASPART 100 UNIT/ML ~~LOC~~ SOLN
0.0000 [IU] | Freq: Three times a day (TID) | SUBCUTANEOUS | Status: DC
  Administered 2019-08-23: 17:00:00 2 [IU] via SUBCUTANEOUS
  Administered 2019-08-23: 12:00:00 1 [IU] via SUBCUTANEOUS
  Administered 2019-08-24 (×2): 5 [IU] via SUBCUTANEOUS
  Administered 2019-08-24 – 2019-08-25 (×2): 1 [IU] via SUBCUTANEOUS
  Administered 2019-08-25: 2 [IU] via SUBCUTANEOUS
  Filled 2019-08-22 (×7): qty 1

## 2019-08-22 MED ORDER — MUPIROCIN 2 % EX OINT
1.0000 "application " | TOPICAL_OINTMENT | Freq: Two times a day (BID) | CUTANEOUS | Status: DC
  Administered 2019-08-22 – 2019-08-24 (×6): 1 via NASAL
  Filled 2019-08-22: qty 22

## 2019-08-22 MED ORDER — ACETAMINOPHEN 650 MG RE SUPP: 650.0000 mg | Freq: Four times a day (QID) | RECTAL | Status: DC | PRN

## 2019-08-22 MED ORDER — ATORVASTATIN CALCIUM 20 MG PO TABS
20.0000 mg | ORAL_TABLET | Freq: Every day | ORAL | Status: DC
  Administered 2019-08-22 – 2019-08-25 (×4): 20 mg via ORAL
  Filled 2019-08-22 (×4): qty 1

## 2019-08-22 MED ORDER — ENOXAPARIN SODIUM 40 MG/0.4ML ~~LOC~~ SOLN
40.0000 mg | SUBCUTANEOUS | Status: DC
  Administered 2019-08-22 – 2019-08-25 (×4): 40 mg via SUBCUTANEOUS
  Filled 2019-08-22 (×5): qty 0.4

## 2019-08-22 MED ORDER — ACETAMINOPHEN 325 MG PO TABS: 650.0000 mg | ORAL_TABLET | Freq: Four times a day (QID) | ORAL | Status: DC | PRN

## 2019-08-22 MED ORDER — SODIUM CHLORIDE 0.9 % IV SOLN
1.0000 g | INTRAVENOUS | Status: DC
  Administered 2019-08-22 – 2019-08-24 (×3): 1 g via INTRAVENOUS
  Filled 2019-08-22 (×3): qty 10
  Filled 2019-08-22: qty 1

## 2019-08-22 MED ORDER — OXYBUTYNIN CHLORIDE 5 MG PO TABS
5.0000 mg | ORAL_TABLET | Freq: Two times a day (BID) | ORAL | Status: DC
  Administered 2019-08-22 – 2019-08-25 (×7): 5 mg via ORAL
  Filled 2019-08-22 (×7): qty 1

## 2019-08-22 MED ORDER — ATENOLOL 25 MG PO TABS
25.0000 mg | ORAL_TABLET | Freq: Every day | ORAL | Status: DC
  Administered 2019-08-22 – 2019-08-25 (×4): 25 mg via ORAL
  Filled 2019-08-22 (×5): qty 1

## 2019-08-22 MED ORDER — ONDANSETRON HCL 4 MG PO TABS: 4.0000 mg | ORAL_TABLET | Freq: Four times a day (QID) | ORAL | Status: DC | PRN

## 2019-08-22 MED ORDER — LACTATED RINGERS IV BOLUS
1000.0000 mL | Freq: Once | INTRAVENOUS | Status: AC
  Administered 2019-08-22: 1000 mL via INTRAVENOUS

## 2019-08-22 MED ORDER — ONDANSETRON HCL 4 MG/2ML IJ SOLN: 4.0000 mg | Freq: Four times a day (QID) | INTRAMUSCULAR | Status: DC | PRN

## 2019-08-22 MED ORDER — SODIUM CHLORIDE 0.9 % IV SOLN: INTRAVENOUS | Status: DC

## 2019-08-22 NOTE — Progress Notes (Signed)
PROGRESS NOTE  Jenna Robinson FVC:[id number] DOB: Sep 30, 1925   PCP: Mickel Fuchs, MD  Patient is from: Assisted living facility.  DOA: 08/21/2019 LOS: 0  Brief Narrative / Interim history: 86 year old female with history of Alzheimer's dementia, DM-2, hypothyroidism and HTN brought to ED due to altered mental status and poor p.o. intake.    Per patient's medical guardian, no fever or new UTI symptoms but history of frequent UTI with similar presentations in the past  In ED, hemodynamically stable.  96% on room air.  Afebrile.  Lactic acid 9.1 but improved to 6.1 after a liter of normal saline bolus.  Calcium 12.5.  Anion gap 17.  CBC without significant finding.  18 and influenza PCR negative.  UA concerning for UTI.  Chest x-ray and EKG without acute finding.  Cultures obtained.  Patient was started on ceftriaxone, and admitted for acute metabolic encephalopathy and possible UTI.   Subjective: Seen and examined earlier this afternoon.  No major events this morning.  She is awake.  Only oriented to self and partial place.  She knew she is in the hospital but does not know why she is in the hospital. She does not recognize the name of a guardian/HCPOA either. She has no complaints.  She denies pain, shortness of breath, GI or UTI symptoms.  She follows command appropriately.    Objective: Vitals:   08/22/19 1100 08/22/19 1130 08/22/19 1300 08/22/19 1400  BP: (!) 169/75 (!) 158/71 (!) 151/57 (!) 140/54  Pulse:  (!) 54  (!) 49  Resp: 20 17 20 16   Temp:      TempSrc:      SpO2:  99%    Weight:      Height:        Intake/Output Summary (Last 24 hours) at 08/22/2019 1452 Last data filed at 08/21/2019 2325 Gross per 24 hour  Intake 1000 ml  Output --  Net 1000 ml   Filed Weights   08/21/19 1816  Weight: 59 kg    Examination:  GENERAL: Frail looking elderly female.  Current distress.  Nontoxic. HEENT: MMM.  Vision and hearing grossly intact.  NECK: Supple.  No apparent  JVD.  RESP: On RA.  No IWOB.  Fair aeration bilaterally. CVS:  RRR. Heart sounds normal.  ABD/GI/GU: BS+. Abd soft, NTND.  MSK/EXT:  Moves extremities. No apparent deformity. No edema.  SKIN: no apparent skin lesion or wound NEURO: Awake and alert.  Oriented to self and partial place.  Follows commands.  Cranial, motor, light sensation and reflexes grossly intact. PSYCH: Calm. Normal affect.  Procedures:  None  Microbiology summarized: COVID-19 PCR negative. Influenza A/B PCR negative. Blood cultures NGTD. Urine cultures pending.  Assessment & Plan: Acute metabolic encephalopathy in patient with history of Alzheimer's dementia-suspect this to be due to dehydration in the setting of poor p.o. intake versus infectious process.  However, it is difficult to delineate history given her mental status.  Per guardian, she seems to be back to baseline now.  She is oriented to self and partial place. -Continue IV fluids -Reorientation and delirium precautions.  Dehydration: Likely due to poor p.o. intake. -Continue IV fluids  Lactic acidosis: Likely from dehydration and Metformin versus sepsis.  His UA is concerning but difficult to tell if this is bacteria or UTI.  Lactic acidosis resolved with IV fluid  Hypercalcemia: Likely due to dehydration.  Improving. -Continue IV fluid -Continue home Sensipar  Controlled DM-2: A1c 6.0%.  On Metformin at home.  Recent Labs  Lab 08/22/19 0840 08/22/19 1303  GLUCAP 90 110*  -Continue SSI -May have to discontinue Metformin indefinitely given lactic acidosis.  Bacteriuria/possible UTI: Concerning UA.  She denies UTI symptoms but not a reliable historian -Continue IV ceftriaxone pending urine culture  Essential hypertension: BP slightly elevated. -Continue home lisinopril and atenolol  Urinary incontinence:  -Continue home oxybutynin  Body mass index is 19.2 kg/m.         DVT prophylaxis:  enoxaparin (LOVENOX) injection 40 mg Start:  08/22/19 0215  Code Status: DNR/DNI-confirmed with "medical guardian", Ms. Luetta Nutting Family Communication: Updated patient's guardian. Status is: Inpatient  Remains inpatient appropriate because:Persistent severe electrolyte disturbances, Altered mental status and IV treatments appropriate due to intensity of illness or inability to take PO.  On IV fluid for dehydration and hypercalcemia.  Also on ceftriaxone for possible UTI   Dispo: The patient is from: ALF              Anticipated d/c is to: ALF              Anticipated d/c date is: 1 day              Patient currently is not medically stable to d/c.       Consultants:  None   Sch Meds:  Scheduled Meds:  atenolol  25 mg Oral Daily   atorvastatin  20 mg Oral Daily   [START ON 08/23/2019] cinacalcet  30 mg Oral Q M,W,F   enoxaparin (LOVENOX) injection  40 mg Subcutaneous Q24H   insulin aspart  0-5 Units Subcutaneous QHS   insulin aspart  0-9 Units Subcutaneous TID WC   lisinopril  10 mg Oral Daily   oxybutynin  5 mg Oral BID   Continuous Infusions:  sodium chloride 100 mL/hr at 08/22/19 1442   cefTRIAXone (ROCEPHIN)  IV     PRN Meds:.acetaminophen **OR** acetaminophen, ondansetron **OR** ondansetron (ZOFRAN) IV  Antimicrobials: Anti-infectives (From admission, onward)   Start     Dose/Rate Route Frequency Ordered Stop   08/22/19 2300  cefTRIAXone (ROCEPHIN) 1 g in sodium chloride 0.9 % 100 mL IVPB     Discontinue     1 g 200 mL/hr over 30 Minutes Intravenous Every 24 hours 08/22/19 0214     08/22/19 0145  cefTRIAXone (ROCEPHIN) 1 g in sodium chloride 0.9 % 100 mL IVPB        1 g 200 mL/hr over 30 Minutes Intravenous  Once 08/22/19 0136 08/22/19 0246       I have personally reviewed the following labs and images: CBC: Recent Labs  Lab 08/21/19 1837 08/22/19 0211  WBC 9.1 8.8  NEUTROABS 6.2  --   HGB 13.6 13.6  HCT 41.7 41.4  MCV 91.2 90.8  PLT 236 221   BMP &GFR Recent Labs  Lab 08/21/19 1837  08/22/19 0211  NA 136 136  K 4.5 3.8  CL 91* 94*  CO2 28 29  GLUCOSE 115* 110*  BUN 33* 28*  CREATININE 0.94 0.85  CALCIUM 12.5* 11.9*   Estimated Creatinine Clearance: 40.2 mL/min (by C-G formula based on SCr of 0.85 mg/dL). Liver & Pancreas: Recent Labs  Lab 08/21/19 1837  AST 36  ALT 23  ALKPHOS 53  BILITOT 0.9  PROT 7.5  ALBUMIN 4.4   No results for input(s): LIPASE, AMYLASE in the last 168 hours. No results for input(s): AMMONIA in the last 168 hours. Diabetic: Recent Labs    08/22/19 0211  HGBA1C  6.0*   Recent Labs  Lab 08/22/19 0840 08/22/19 1303  GLUCAP 90 110*   Cardiac Enzymes: No results for input(s): CKTOTAL, CKMB, CKMBINDEX, TROPONINI in the last 168 hours. No results for input(s): PROBNP in the last 8760 hours. Coagulation Profile: Recent Labs  Lab 08/22/19 0206 08/22/19 0636  INR 1.0 1.1   Thyroid Function Tests: No results for input(s): TSH, T4TOTAL, FREET4, T3FREE, THYROIDAB in the last 72 hours. Lipid Profile: No results for input(s): CHOL, HDL, LDLCALC, TRIG, CHOLHDL, LDLDIRECT in the last 72 hours. Anemia Panel: No results for input(s): VITAMINB12, FOLATE, FERRITIN, TIBC, IRON, RETICCTPCT in the last 72 hours. Urine analysis:    Component Value Date/Time   COLORURINE YELLOW (A) 08/22/2019 0015   APPEARANCEUR HAZY (A) 08/22/2019 0015   APPEARANCEUR Clear 01/09/2013 1900   LABSPEC 1.015 08/22/2019 0015   LABSPEC 1.016 01/09/2013 1900   PHURINE 5.0 08/22/2019 0015   GLUCOSEU NEGATIVE 08/22/2019 0015   GLUCOSEU >=500 01/09/2013 1900   HGBUR SMALL (A) 08/22/2019 0015   BILIRUBINUR NEGATIVE 08/22/2019 0015   BILIRUBINUR Negative 01/09/2013 1900   KETONESUR 5 (A) 08/22/2019 0015   PROTEINUR NEGATIVE 08/22/2019 0015   NITRITE POSITIVE (A) 08/22/2019 0015   LEUKOCYTESUR SMALL (A) 08/22/2019 0015   LEUKOCYTESUR 1+ 01/09/2013 1900   Sepsis Labs: Invalid input(s): PROCALCITONIN, LACTICIDVEN  Microbiology: Recent Results (from the  past 240 hour(s))  Resp Panel by RT PCR (RSV, Flu A&B, Covid) - Nasopharyngeal Swab     Status: None   Collection Time: 08/21/19 10:27 PM   Specimen: Nasopharyngeal Swab  Result Value Ref Range Status   SARS Coronavirus 2 by RT PCR NEGATIVE NEGATIVE Final    Comment: (NOTE) SARS-CoV-2 target nucleic acids are NOT DETECTED.  The SARS-CoV-2 RNA is generally detectable in upper respiratoy specimens during the acute phase of infection. The lowest concentration of SARS-CoV-2 viral copies this assay can detect is 131 copies/mL. A negative result does not preclude SARS-Cov-2 infection and should not be used as the sole basis for treatment or other patient management decisions. A negative result may occur with  improper specimen collection/handling, submission of specimen other than nasopharyngeal swab, presence of viral mutation(s) within the areas targeted by this assay, and inadequate number of viral copies (<131 copies/mL). A negative result must be combined with clinical observations, patient history, and epidemiological information. The expected result is Negative.  Fact Sheet for Patients:  https://www.moore.com/  Fact Sheet for Healthcare Providers:  https://www.young.biz/  This test is no t yet approved or cleared by the [place] FDA and  has been authorized for detection and/or diagnosis of SARS-CoV-2 by FDA under an Emergency Use Authorization (EUA). This EUA will remain  in effect (meaning this test can be used) for the duration of the COVID-19 declaration under Section 564(b)(1) of the Act, 21 U.S.C. section 360bbb-3(b)(1), unless the authorization is terminated or revoked sooner.     Influenza A by PCR NEGATIVE NEGATIVE Final   Influenza B by PCR NEGATIVE NEGATIVE Final    Comment: (NOTE) The Xpert Xpress SARS-CoV-2/FLU/RSV assay is intended as an aid in  the diagnosis of influenza from Nasopharyngeal swab specimens and    should not be used as a sole basis for treatment. Nasal washings and  aspirates are unacceptable for Xpert Xpress SARS-CoV-2/FLU/RSV  testing.  Fact Sheet for Patients: https://www.moore.com/  Fact Sheet for Healthcare Providers: https://www.young.biz/  This test is not yet approved or cleared by the [place] FDA and  has been authorized for detection  and/or diagnosis of SARS-CoV-2 by  FDA under an Emergency Use Authorization (EUA). This EUA will remain  in effect (meaning this test can be used) for the duration of the  Covid-19 declaration under Section 564(b)(1) of the Act, 21  U.S.C. section 360bbb-3(b)(1), unless the authorization is  terminated or revoked.    Respiratory Syncytial Virus by PCR NEGATIVE NEGATIVE Final    Comment: (NOTE) Fact Sheet for Patients: https://www.moore.com/https://www.fda.gov/media/142436/download  Fact Sheet for Healthcare Providers: https://www.young.biz/https://www.fda.gov/media/142435/download  This test is not yet approved or cleared by the [place] FDA and  has been authorized for detection and/or diagnosis of SARS-CoV-2 by  FDA under an Emergency Use Authorization (EUA). This EUA will remain  in effect (meaning this test can be used) for the duration of the  COVID-19 declaration under Section 564(b)(1) of the Act, 21 U.S.C.  section 360bbb-3(b)(1), unless the authorization is terminated or  revoked. Performed at [facility], [address] [place], [place] [address]   Blood culture (routine x 2)     Status: None (Preliminary result)   Collection Time: 08/22/19  2:06 AM   Specimen: BLOOD  Result Value Ref Range Status   Specimen Description BLOOD LAC  Final   Special Requests BOTTLES DRAWN AEROBIC AND ANAEROBIC BCAV  Final   Culture   Final    NO GROWTH < 12 HOURS Performed at [facility], [address] [place], [place] [address]    Report Status PENDING  Incomplete  Blood culture (routine x 2)      Status: None (Preliminary result)   Collection Time: 08/22/19  2:06 AM   Specimen: BLOOD  Result Value Ref Range Status   Specimen Description BLOOD LFOA  Final   Special Requests BOTTLES DRAWN AEROBIC AND ANAEROBIC BCAV  Final   Culture   Final    NO GROWTH < 12 HOURS Performed at [facility], [address] [place], [place] [address]    Report Status PENDING  Incomplete    Radiology Studies: DG Chest 2 View  Result Date: 08/21/2019 CLINICAL DATA:  Failure to thrive.  Not eating or drinking. EXAM: CHEST - 2 VIEW COMPARISON:  04/10/2019 FINDINGS: Heart size is normal. Chronic aortic atherosclerosis. No infiltrate, collapse or effusion. Ordinary spinal curvature. Loose body of the right shoulder. IMPRESSION: No active cardiopulmonary disease.  Aortic atherosclerosis. Electronically Signed   By: Paulina FusiMark  Shogry M.D.   On: 08/21/2019 19:25     Additional 35 minutes with more than 50% spent in reviewing records, counseling patient/family and coordinating care.   Daimen Shovlin T. Y[place] Pacella [facility]  If 7PM-7AM, please contact night-coverage [email or web address] Password [email or web address] 08/22/2019, 2:52 PM

## 2019-08-22 NOTE — Progress Notes (Addendum)
Pt legal guardian, Jenna Robinson, notified of pt moving to room 125. Code status also addressed with legal guardian and patient is to be a DNR/DNI. MD notified of this change of status   [name] [phone]

## 2019-08-22 NOTE — ED Notes (Signed)
This RN, Isabelle Course NT, and Brit NT completed I&O cath. Urine sample sent to lab. Pt cooperative.

## 2019-08-22 NOTE — ED Notes (Signed)
Floor RN Zollie Scale called this RN back to get ascom number as they're currently looking to obtain a tele-box for pt. State they'll call ER nurse when they find a box for pt.

## 2019-08-22 NOTE — ED Notes (Signed)
Lisa NT collected both sets of blood cultures. L ac and L fa. Sent to lab. Blue tube sent to lab.

## 2019-08-22 NOTE — Progress Notes (Signed)
CODE SEPSIS - PHARMACY COMMUNICATION  **Broad Spectrum Antibiotics should be administered within 1 hour of Sepsis diagnosis**  Time Code Sepsis Called/Page Received: 0136  Antibiotics Ordered: ceftriaxone  Time of 1st antibiotic administration: 0216  Additional action taken by pharmacy:   If necessary, Name of Provider/Nurse Contacted:     Thomasene Ripple ,PharmD Clinical Pharmacist  08/22/2019  2:18 AM

## 2019-08-22 NOTE — ED Notes (Signed)
Patient denies pain and is resting comfortably.  

## 2019-08-22 NOTE — ED Notes (Signed)
Pt laying calmly in recliner in room near nurses station.

## 2019-08-22 NOTE — Progress Notes (Signed)
Report called to Peterson Ao, RN on 1C

## 2019-08-22 NOTE — H&P (Signed)
History and Physical    Jenna Robinson ONG:[id number] DOB: 1925-12-21 DOA: 08/21/2019  PCP: Elba Barman, MD   Patient coming from: Home  I have personally briefly reviewed patient's old medical records in [place]  Chief Complaint: Altered mental status  HPI: Jenna Robinson is a 86 y.o. female with medical history significant for dementia, DM, HTN, who presents from assisted living facility with 1 week history of decreased interactiveness from baseline, not eating or drinking and sleeping throughout the day.  At baseline she is independent in activities of daily living, interactive and talkative. ED Course: On arrival, patient was very somnolent but vitals were within normal limits.  Blood work significant for normal WBC of 9.1 but elevated lactic acid of 9.1 improving to 6.1 after a 1 L normal saline bolus.  Urinalysis showed pyuria.  Other labs mostly unremarkable.  Chest x-ray with no acute disease.  EKG no acute ST-T wave changes.  Sepsis protocol was initiated patient started on Rocephin and hospitalist consulted for admission.  Review of Systems: Unable to obtain due to altered mental status   Past Medical History:  Diagnosis Date  . Alzheimer disease (Jenna Robinson)   . Diabetes mellitus without complication (Jenna Robinson)   . Hypercholesteremia   . Hypertension   . Hyperthyroidism     Past Surgical History:  Procedure Laterality Date  . ABDOMINAL HYSTERECTOMY    . DILATION AND CURETTAGE, DIAGNOSTIC / THERAPEUTIC    . INTRAMEDULLARY (IM) NAIL INTERTROCHANTERIC Right 04/08/2019   Procedure: INTRAMEDULLARY (IM) NAIL INTERTROCHANTRIC;  Surgeon: Leim Fabry, MD;  Location: [facility] ORS;  Service: Orthopedics;  Laterality: Right;     reports that she has never smoked. She has never used smokeless tobacco. She reports that she does not drink alcohol. No history on file for drug use.  Allergies  Allergen Reactions  . Penicillins Itching    Family History  Problem  Relation Age of Onset  . Cancer Other        multiple family members including all siblings      Prior to Admission medications   Medication Sig Start Date End Date Taking? Authorizing Provider  acetaminophen (TYLENOL) 325 MG tablet Take 650 mg by mouth every 6 (six) hours as needed for mild pain, moderate pain, fever or headache.    [provider]  alendronate (FOSAMAX) 70 MG tablet Take 1 tablet by mouth once a week. With 8 oz of water at least 30 minutes before food/meds. Do not lie down for 30 minutes after dose. 11/11/16   [provider]  aspirin EC 81 MG tablet Take 81 mg by mouth daily.    [provider]  atenolol (TENORMIN) 25 MG tablet Take 75 mg by mouth daily.     [provider]  atorvastatin (LIPITOR) 20 MG tablet Take 1 tablet (20 mg total) by mouth daily. 01/05/15   Gladstone Lighter, MD  cholecalciferol (VITAMIN D) 25 MCG (1000 UNIT) tablet Take 2,000 Units by mouth daily.    [provider]  cinacalcet (SENSIPAR) 30 MG tablet Take 30 mg by mouth every Monday, Wednesday, and Friday.    [provider]  diphenhydrAMINE (BENADRYL) 50 MG capsule Take 50 mg by mouth at bedtime.    [provider]  enoxaparin (LOVENOX) 40 MG/0.4ML injection Inject 0.4 mLs (40 mg total) into the skin daily for 14 doses. 04/10/19 04/24/19  Reche Dixon, PA-C  glipiZIDE (GLUCOTROL XL) 2.5 MG 24 hr tablet Take 2.5 mg by mouth daily  with breakfast. *DO NOT CRUSH OR CHEW*    [provider]  insulin detemir (LEVEMIR) 100 unit/ml SOLN Inject 0.16 mLs (16 Units total) into the skin daily. Patient not taking: Reported on 04/08/2019 01/04/15   Gladstone Lighter, MD  lisinopril (PRINIVIL,ZESTRIL) 10 MG tablet Take 1 tablet by mouth daily.    [provider]  Magnesium Oxide 500 MG TABS Take 1 tablet by mouth daily.    [provider]  Melatonin 10 MG CAPS Take 1 capsule by mouth at bedtime.    [provider]    metFORMIN (GLUMETZA) 500 MG (MOD) 24 hr tablet Take 1,000 mg by mouth 2 (two) times daily.     [provider]  Nicotine 21-14-7 MG/24HR KIT Place 21 mg onto the skin daily. Patient not taking: Reported on 05/22/2015 01/05/15   Gladstone Lighter, MD  oxybutynin (DITROPAN) 5 MG tablet Take 5 mg by mouth 2 (two) times daily.    [provider]  oxyCODONE (OXY IR/ROXICODONE) 5 MG immediate release tablet Take 0.5-1 tablets (2.5-5 mg total) by mouth every 4 (four) hours as needed for moderate pain or severe pain (pain score 4-6). 04/10/19   Reche Dixon, PA-C  traMADol (ULTRAM) 50 MG tablet Take 1 tablet (50 mg total) by mouth every 6 (six) hours as needed for moderate pain. 04/10/19   Reche Dixon, PA-C  traZODone (DESYREL) 50 MG tablet Take 25 mg by mouth at bedtime.    [provider]  Vitamin D, Ergocalciferol, (DRISDOL) 50000 units CAPS capsule Take 50,000 Units by mouth every 7 (seven) days.    [provider]  zinc oxide 20 % ointment Apply 1 application topically 2 (two) times daily. Apply topically to buttocks twice daily for dermatitis    [provider]    Physical Exam: Vitals:   08/22/19 0031 08/22/19 0100 08/22/19 0115 08/22/19 0202  BP:  (!) 155/76    Pulse:  60 89   Resp:      Temp:    (!) 97.5 F (36.4 C)  TempSrc:    Axillary  SpO2: 100% 99% 99%   Weight:      Height:         Vitals:   08/22/19 0031 08/22/19 0100 08/22/19 0115 08/22/19 0202  BP:  (!) 155/76    Pulse:  60 89   Resp:      Temp:    (!) 97.5 F (36.4 C)  TempSrc:    Axillary  SpO2: 100% 99% 99%   Weight:      Height:          Constitutional:  Somnolent. Not in any apparent distress HEENT:      Head: Normocephalic and atraumatic.         Eyes: PERLA, EOMI, Conjunctivae are normal. Sclera is non-icteric.       Mouth/Throat: Mucous membranes are moist.       Neck: Supple with no signs of meningismus. Cardiovascular: Regular rate and rhythm. No murmurs,  gallops, or rubs. 2+ symmetrical distal pulses are present . No JVD. No LE edema Respiratory: Respiratory effort normal .Lungs sounds clear bilaterally. No wheezes, crackles, or rhonchi.  Gastrointestinal: Soft, non tender, and non distended with positive bowel sounds. No rebound or guarding. Genitourinary: No CVA tenderness. Musculoskeletal: Nontender with normal range of motion in all extremities. No cyanosis, or erythema of extremities. Neurologic: . Face is symmetric. Moving all extremities. No gross focal neurologic deficits . Skin: Skin is warm, dry.  No  rash or ulcers    Labs on Admission: I have personally reviewed following labs and imaging studies  CBC: Recent Labs  Lab 08/21/19 1837  WBC 9.1  NEUTROABS 6.2  HGB 13.6  HCT 41.7  MCV 91.2  PLT 330   Basic Metabolic Panel: Recent Labs  Lab 08/21/19 1837  NA 136  K 4.5  CL 91*  CO2 28  GLUCOSE 115*  BUN 33*  CREATININE 0.94  CALCIUM 12.5*   GFR: Estimated Creatinine Clearance: 36.3 mL/min (by C-G formula based on SCr of 0.94 mg/dL). Liver Function Tests: Recent Labs  Lab 08/21/19 1837  AST 36  ALT 23  ALKPHOS 53  BILITOT 0.9  PROT 7.5  ALBUMIN 4.4   No results for input(s): LIPASE, AMYLASE in the last 168 hours. No results for input(s): AMMONIA in the last 168 hours. Coagulation Profile: No results for input(s): INR, PROTIME in the last 168 hours. Cardiac Enzymes: No results for input(s): CKTOTAL, CKMB, CKMBINDEX, TROPONINI in the last 168 hours. BNP (last 3 results) No results for input(s): PROBNP in the last 8760 hours. HbA1C: No results for input(s): HGBA1C in the last 72 hours. CBG: No results for input(s): GLUCAP in the last 168 hours. Lipid Profile: No results for input(s): CHOL, HDL, LDLCALC, TRIG, CHOLHDL, LDLDIRECT in the last 72 hours. Thyroid Function Tests: No results for input(s): TSH, T4TOTAL, FREET4, T3FREE, THYROIDAB in the last 72 hours. Anemia Panel: No results for input(s):  VITAMINB12, FOLATE, FERRITIN, TIBC, IRON, RETICCTPCT in the last 72 hours. Urine analysis:    Component Value Date/Time   COLORURINE YELLOW (A) 08/22/2019 0015   APPEARANCEUR HAZY (A) 08/22/2019 0015   APPEARANCEUR Clear 01/09/2013 1900   LABSPEC 1.015 08/22/2019 0015   LABSPEC 1.016 01/09/2013 1900   PHURINE 5.0 08/22/2019 0015   GLUCOSEU NEGATIVE 08/22/2019 0015   GLUCOSEU >=500 01/09/2013 1900   HGBUR SMALL (A) 08/22/2019 0015   BILIRUBINUR NEGATIVE 08/22/2019 0015   BILIRUBINUR Negative 01/09/2013 1900   KETONESUR 5 (A) 08/22/2019 0015   PROTEINUR NEGATIVE 08/22/2019 0015   NITRITE POSITIVE (A) 08/22/2019 0015   LEUKOCYTESUR SMALL (A) 08/22/2019 0015   LEUKOCYTESUR 1+ 01/09/2013 1900    Radiological Exams on Admission: DG Chest 2 View  Result Date: 08/21/2019 CLINICAL DATA:  Failure to thrive.  Not eating or drinking. EXAM: CHEST - 2 VIEW COMPARISON:  04/10/2019 FINDINGS: Heart size is normal. Chronic aortic atherosclerosis. No infiltrate, collapse or effusion. Ordinary spinal curvature. Loose body of the right shoulder. IMPRESSION: No active cardiopulmonary disease.  Aortic atherosclerosis. Electronically Signed   By: Nelson Chimes M.D.   On: 08/21/2019 19:25    Assessment/Plan 86 year old female with history of dementia, DM, HTN, presenting with decreased interactiveness from baseline, work-up showing UTI and lactic acidosis without other sepsis criteria  UTI with possible sepsis secondary versus lactic acidosis -Patient with altered mental status, UTI, elevated lactic acid but no fever or tachycardia or leukocytosis -Continue IV fluids -Continue Rocephin -Follow cultures    Acute metabolic encephalopathy -Secondary to above -Fall and aspiration precautions    Inadequate oral intake Nutritionist consult    Dementia without behavioral disturbance ([place]) -Increase nursing assistance    Diabetes mellitus without complication ([place]) -Sliding scale insulin coverage  DVT  prophylaxis: Lovenox  Code Status: full code  Family Communication:  none  Disposition Plan: Back to previous home environment Consults called: none  Status: Observation    Athena Masse MD [facility]     08/22/2019, 2:15 AM

## 2019-08-23 DIAGNOSIS — E43 Unspecified severe protein-calorie malnutrition: Secondary | ICD-10-CM | POA: Insufficient documentation

## 2019-08-23 LAB — CBC
HCT: 36.7 % (ref 36.0–46.0)
Hemoglobin: 12.7 g/dL (ref 12.0–15.0)
MCH: 30 pg (ref 26.0–34.0)
MCHC: 34.6 g/dL (ref 30.0–36.0)
MCV: 86.8 fL (ref 80.0–100.0)
Platelets: 180 10*3/uL (ref 150–400)
RBC: 4.23 MIL/uL (ref 3.87–5.11)
RDW: 13.9 % (ref 11.5–15.5)
WBC: 8 10*3/uL (ref 4.0–10.5)
nRBC: 0 % (ref 0.0–0.2)

## 2019-08-23 LAB — RENAL FUNCTION PANEL
Albumin: 4 g/dL (ref 3.5–5.0)
Anion gap: 10 (ref 5–15)
BUN: 13 mg/dL (ref 8–23)
CO2: 29 mmol/L (ref 22–32)
Calcium: 9.9 mg/dL (ref 8.9–10.3)
Chloride: 95 mmol/L — ABNORMAL LOW (ref 98–111)
Creatinine, Ser: 0.58 mg/dL (ref 0.44–1.00)
GFR calc Af Amer: >60 mL/min (ref >60)
GFR calc non Af Amer: >60 mL/min (ref >60)
Glucose, Bld: 98 mg/dL (ref 70–99)
Phosphorus: 1.6 mg/dL — ABNORMAL LOW (ref 2.5–4.6)
Potassium: 2.8 mmol/L — ABNORMAL LOW (ref 3.5–5.1)
Sodium: 134 mmol/L — ABNORMAL LOW (ref 135–145)

## 2019-08-23 LAB — GLUCOSE, CAPILLARY
Glucose-Capillary: 106 mg/dL — ABNORMAL HIGH (ref 70–99)
Glucose-Capillary: 141 mg/dL — ABNORMAL HIGH (ref 70–99)
Glucose-Capillary: 159 mg/dL — ABNORMAL HIGH (ref 70–99)
Glucose-Capillary: 145 mg/dL — ABNORMAL HIGH (ref 70–99)

## 2019-08-23 LAB — MAGNESIUM: Magnesium: 1.3 mg/dL — ABNORMAL LOW (ref 1.7–2.4)

## 2019-08-23 MED ORDER — ADULT MULTIVITAMIN W/MINERALS CH
1.0000 | ORAL_TABLET | Freq: Every day | ORAL | Status: DC
  Administered 2019-08-24 – 2019-08-25 (×2): 1 via ORAL
  Filled 2019-08-23 (×2): qty 1

## 2019-08-23 MED ORDER — MIRTAZAPINE 15 MG PO TABS
7.5000 mg | ORAL_TABLET | Freq: Every day | ORAL | Status: DC
  Administered 2019-08-23 – 2019-08-24 (×2): 7.5 mg via ORAL
  Filled 2019-08-23 (×2): qty 1

## 2019-08-23 MED ORDER — POTASSIUM PHOSPHATES 15 MMOLE/5ML IV SOLN
30.0000 mmol | Freq: Once | INTRAVENOUS | Status: AC
  Administered 2019-08-23: 10:00:00 30 mmol via INTRAVENOUS
  Filled 2019-08-23: qty 10

## 2019-08-23 MED ORDER — ENSURE ENLIVE PO LIQD
237.0000 mL | Freq: Two times a day (BID) | ORAL | Status: DC
  Administered 2019-08-23 – 2019-08-25 (×5): 237 mL via ORAL

## 2019-08-23 MED ORDER — MAGNESIUM SULFATE 2 GM/50ML IV SOLN
2.0000 g | Freq: Once | INTRAVENOUS | Status: AC
  Administered 2019-08-23: 08:00:00 2 g via INTRAVENOUS
  Filled 2019-08-23: qty 50

## 2019-08-23 MED ORDER — POTASSIUM CHLORIDE CRYS ER 20 MEQ PO TBCR
40.0000 meq | EXTENDED_RELEASE_TABLET | ORAL | Status: AC
  Administered 2019-08-23 (×2): 40 meq via ORAL
  Filled 2019-08-23 (×2): qty 2

## 2019-08-23 NOTE — Progress Notes (Signed)
PROGRESS NOTE    Jenna Robinson  FGH:[id number] DOB: Mar 08, 1925 DOA: 08/21/2019 PCP: Mickel Fuchs, MD   Brief Narrative: 86 year old with past medical history significant for Alzheimer's dementia, diabetes type 2, hypothyroidism and hypertension brought to the ED due to altered mental status and poor oral intake.  In the ED, hemodynamically stable, lactic acid 9.1 subsequently decreased to 6.1.  Calcium 12.5, anion gap 17.  Influenza PCR negative.  UA concerning for UTI.  Chest x-ray and EKG without acute finding. Patient was admitted for acute metabolic encephalopathy possible UTI.  She was a started on IV ceftriaxone.    Assessment & Plan:   Principal Problem:   Sepsis secondary to UTI [facility]) Active Problems:   Lactic acidosis   Acute metabolic encephalopathy   Inadequate oral intake   Dementia without behavioral disturbance (HCC)   Diabetes mellitus without complication (HCC)  1-Acute metabolic encephalopathy: Patient with a prior history of Alzheimer's dementia. -Suspect related to dehydration, poor oral intake, infectious process. -Patient currently is alert and oriented to place and person. -Delirium precaution -Continue treatment of UTI  2-Dehydration: Secondary to poor oral intake treated with IV fluids.  3-Lactic acidosis: Likely from dehydration and Metformin versus  Infection.  Resolved with IV fluids  4-Hypercalcemia: Likely secondary to hypovolemia Treated with IV fluids.  Diabetes type 2: On Metformin. May need to discontinue Metformin at discharge given lactic acidosis  UTI: UA; 21-50 white blood cell Urine culture growing gram-negative rods Continue with IV ceftriaxone  Hypomagnesemia;  Iv hypomagnesium.   Hypophosphatemia; IV replacement.   Hypokalemia; replete orally.   Urinary incontinence: On oxybutynin.  Essential hypertension: Continue with lisinopril and atenolol  Estimated body mass index is 19.2 kg/m as calculated from the  following:   Height as of this encounter: 5\' 9"  (1.753 m).   Weight as of this encounter: 59 kg.   DVT prophylaxis: Lovenox Code Status: DNR Family Communication: No family at bedside Disposition Plan:  Status is: Inpatient  Remains inpatient appropriate because:Persistent severe electrolyte disturbances   Dispo: The patient is from: ALF              Anticipated d/c is to: ALF              Anticipated d/c date is: 2 days              Patient currently is not medically stable to d/c.  Replete electrolytes, follow urine culture.  PT eval        Consultants:   None  Procedures:   None  Antimicrobials:  Ceftriaxone   Subjective: She is alert, poor oral intake. Per nurse patient does better if she is fed.  Denies abdominal pain, diarrhea.    Objective: Vitals:   08/23/19 0515 08/23/19 0517 08/23/19 0737 08/23/19 1139  BP:   125/72 (!) 145/65  Pulse: (!) 115 (!) 54 61 62  Resp:   18 (!) 24  Temp:   97.7 F (36.5 C) 98 F (36.7 C)  TempSrc:   Oral Oral  SpO2: 99%  100% 100%  Weight:      Height:        Intake/Output Summary (Last 24 hours) at 08/23/2019 1307 Last data filed at 08/23/2019 0935 Gross per 24 hour  Intake 1996.02 ml  Output 300.3 ml  Net 1695.72 ml   Filed Weights   08/21/19 1816  Weight: 59 kg    Examination:  General exam: Appears calm and comfortable  Respiratory system: Clear to  auscultation. Respiratory effort normal. Cardiovascular system: S1 & S2 heard, RRR. No JVD, murmurs, rubs, gallops or clicks. No pedal edema. Gastrointestinal system: Abdomen is nondistended, soft and nontender. No organomegaly or masses felt. Normal bowel sounds heard. Central nervous system: Alert and oriented. Follows command.  Extremities: Symmetric 5 x 5 power. Skin: No rashes, lesions or ulcers   Data Reviewed: I have personally reviewed following labs and imaging studies  CBC: Recent Labs  Lab 08/21/19 1837 08/22/19 0211 08/23/19 0523  WBC  9.1 8.8 8.0  NEUTROABS 6.2  --   --   HGB 13.6 13.6 12.7  HCT 41.7 41.4 36.7  MCV 91.2 90.8 86.8  PLT 236 221 180   Basic Metabolic Panel: Recent Labs  Lab 08/21/19 1837 08/22/19 0211 08/23/19 0523  NA 136 136 134*  K 4.5 3.8 2.8*  CL 91* 94* 95*  CO2 28 29 29   GLUCOSE 115* 110* 98  BUN 33* 28* 13  CREATININE 0.94 0.85 0.58  CALCIUM 12.5* 11.9* 9.9  MG  --   --  1.3*  PHOS  --   --  1.6*   GFR: Estimated Creatinine Clearance: 42.7 mL/min (by C-G formula based on SCr of 0.58 mg/dL). Liver Function Tests: Recent Labs  Lab 08/21/19 1837 08/23/19 0523  AST 36  --   ALT 23  --   ALKPHOS 53  --   BILITOT 0.9  --   PROT 7.5  --   ALBUMIN 4.4 4.0   No results for input(s): LIPASE, AMYLASE in the last 168 hours. No results for input(s): AMMONIA in the last 168 hours. Coagulation Profile: Recent Labs  Lab 08/22/19 0206 08/22/19 0636  INR 1.0 1.1   Cardiac Enzymes: No results for input(s): CKTOTAL, CKMB, CKMBINDEX, TROPONINI in the last 168 hours. BNP (last 3 results) No results for input(s): PROBNP in the last 8760 hours. HbA1C: Recent Labs    08/22/19 0211  HGBA1C 6.0*   CBG: Recent Labs  Lab 08/22/19 1303 08/22/19 1629 08/22/19 2122 08/23/19 0738 08/23/19 1140  GLUCAP 110* 82 111* 106* 141*   Lipid Profile: No results for input(s): CHOL, HDL, LDLCALC, TRIG, CHOLHDL, LDLDIRECT in the last 72 hours. Thyroid Function Tests: No results for input(s): TSH, T4TOTAL, FREET4, T3FREE, THYROIDAB in the last 72 hours. Anemia Panel: No results for input(s): VITAMINB12, FOLATE, FERRITIN, TIBC, IRON, RETICCTPCT in the last 72 hours. Sepsis Labs: Recent Labs  Lab 08/21/19 1837 08/22/19 0015 08/22/19 0211 08/22/19 0924  PROCALCITON  --   --  0.26  --   LATICACIDVEN 9.1* 6.1*  --  1.8    Recent Results (from the past 240 hour(s))  Resp Panel by RT PCR (RSV, Flu A&B, Covid) - Nasopharyngeal Swab     Status: None   Collection Time: 08/21/19 10:27 PM    Specimen: Nasopharyngeal Swab  Result Value Ref Range Status   SARS Coronavirus 2 by RT PCR NEGATIVE NEGATIVE Final    Comment: (NOTE) SARS-CoV-2 target nucleic acids are NOT DETECTED.  The SARS-CoV-2 RNA is generally detectable in upper respiratoy specimens during the acute phase of infection. The lowest concentration of SARS-CoV-2 viral copies this assay can detect is 131 copies/mL. A negative result does not preclude SARS-Cov-2 infection and should not be used as the sole basis for treatment or other patient management decisions. A negative result may occur with  improper specimen collection/handling, submission of specimen other than nasopharyngeal swab, presence of viral mutation(s) within the areas targeted by this assay, and inadequate  number of viral copies (<131 copies/mL). A negative result must be combined with clinical observations, patient history, and epidemiological information. The expected result is Negative.  Fact Sheet for Patients:  https://www.moore.com/https://www.fda.gov/media/142436/download  Fact Sheet for Healthcare Providers:  https://www.young.biz/https://www.fda.gov/media/142435/download  This test is no t yet approved or cleared by the [place] FDA and  has been authorized for detection and/or diagnosis of SARS-CoV-2 by FDA under an Emergency Use Authorization (EUA). This EUA will remain  in effect (meaning this test can be used) for the duration of the COVID-19 declaration under Section 564(b)(1) of the Act, 21 U.S.C. section 360bbb-3(b)(1), unless the authorization is terminated or revoked sooner.     Influenza A by PCR NEGATIVE NEGATIVE Final   Influenza B by PCR NEGATIVE NEGATIVE Final    Comment: (NOTE) The Xpert Xpress SARS-CoV-2/FLU/RSV assay is intended as an aid in  the diagnosis of influenza from Nasopharyngeal swab specimens and  should not be used as a sole basis for treatment. Nasal washings and  aspirates are unacceptable for Xpert Xpress SARS-CoV-2/FLU/RSV   testing.  Fact Sheet for Patients: https://www.moore.com/https://www.fda.gov/media/142436/download  Fact Sheet for Healthcare Providers: https://www.young.biz/https://www.fda.gov/media/142435/download  This test is not yet approved or cleared by the [place] FDA and  has been authorized for detection and/or diagnosis of SARS-CoV-2 by  FDA under an Emergency Use Authorization (EUA). This EUA will remain  in effect (meaning this test can be used) for the duration of the  Covid-19 declaration under Section 564(b)(1) of the Act, 21  U.S.C. section 360bbb-3(b)(1), unless the authorization is  terminated or revoked.    Respiratory Syncytial Virus by PCR NEGATIVE NEGATIVE Final    Comment: (NOTE) Fact Sheet for Patients: https://www.moore.com/https://www.fda.gov/media/142436/download  Fact Sheet for Healthcare Providers: https://www.young.biz/https://www.fda.gov/media/142435/download  This test is not yet approved or cleared by the [place] FDA and  has been authorized for detection and/or diagnosis of SARS-CoV-2 by  FDA under an Emergency Use Authorization (EUA). This EUA will remain  in effect (meaning this test can be used) for the duration of the  COVID-19 declaration under Section 564(b)(1) of the Act, 21 U.S.C.  section 360bbb-3(b)(1), unless the authorization is terminated or  revoked. Performed at [facility], [address] [place], [place] [address]   Urine culture     Status: Abnormal (Preliminary result)   Collection Time: 08/22/19 12:15 AM   Specimen: Urine, Random  Result Value Ref Range Status   Specimen Description   Final    URINE, RANDOM Performed at [facility], [address] [place], [place] [address]    Special Requests   Final    NONE Performed at [facility], [address] [place], [place] [address]    Culture (A)  Final    >=100,000 COLONIES/mL GRAM NEGATIVE RODS IDENTIFICATION AND SUSCEPTIBILITIES TO FOLLOW Performed at [facility], [address] [place], [place] [address]     Report Status PENDING  Incomplete  Blood culture (routine x 2)     Status: None (Preliminary result)   Collection Time: 08/22/19  2:06 AM   Specimen: BLOOD  Result Value Ref Range Status   Specimen Description BLOOD LAC  Final   Special Requests BOTTLES DRAWN AEROBIC AND ANAEROBIC BCAV  Final   Culture   Final    NO GROWTH 1 DAY Performed at [facility], [address] [place], [place] [address]    Report Status PENDING  Incomplete  Blood culture (routine x 2)     Status:  None (Preliminary result)   Collection Time: 08/22/19  2:06 AM   Specimen: BLOOD  Result Value Ref Range Status   Specimen Description BLOOD LFOA  Final   Special Requests BOTTLES DRAWN AEROBIC AND ANAEROBIC BCAV  Final   Culture   Final    NO GROWTH 1 DAY Performed at [facility], [address] [place], [place] [address]    Report Status PENDING  Incomplete         Radiology Studies: DG Chest 2 View  Result Date: 08/21/2019 CLINICAL DATA:  Failure to thrive.  Not eating or drinking. EXAM: CHEST - 2 VIEW COMPARISON:  04/10/2019 FINDINGS: Heart size is normal. Chronic aortic atherosclerosis. No infiltrate, collapse or effusion. Ordinary spinal curvature. Loose body of the right shoulder. IMPRESSION: No active cardiopulmonary disease.  Aortic atherosclerosis. Electronically Signed   By: Paulina Fusi M.D.   On: 08/21/2019 19:25        Scheduled Meds:  atenolol  25 mg Oral Daily   atorvastatin  20 mg Oral Daily   cinacalcet  30 mg Oral Q M,W,F   enoxaparin (LOVENOX) injection  40 mg Subcutaneous Q24H   feeding supplement (ENSURE ENLIVE)  237 mL Oral BID BM   insulin aspart  0-5 Units Subcutaneous QHS   insulin aspart  0-9 Units Subcutaneous TID WC   lisinopril  10 mg Oral Daily   mirtazapine  7.5 mg Oral QHS   [START ON 08/24/2019] multivitamin with minerals  1 tablet Oral Daily   mupirocin ointment  1 application Nasal BID   oxybutynin  5 mg Oral BID   Continuous  Infusions:  sodium chloride Stopped (08/22/19 1758)   cefTRIAXone (ROCEPHIN)  IV Stopped (08/22/19 2303)   potassium PHOSPHATE IVPB (in mmol) 30 mmol (08/23/19 0935)     LOS: 1 day    Time spent: 35 minutes.     Alba Cory, MD [facility]   If 7PM-7AM, please contact night-coverage [email or web address]  08/23/2019, 1:07 PM

## 2019-08-23 NOTE — Progress Notes (Signed)
Initial Nutrition Assessment  DOCUMENTATION CODES:   Severe malnutrition in context of social or environmental circumstances  INTERVENTION:   Ensure Enlive po BID, each supplement provides 350 kcal and 20 grams of protein  Magic cup TID with meals, each supplement provides 290 kcal and 9 grams of protein  MVI daily   Pt at high refeed risk; recommend monitor K, Mg and P labs daily until stable  NUTRITION DIAGNOSIS:   Severe Malnutrition related to social / environmental circumstances (dementia) as evidenced by severe muscle depletion, severe fat depletion.  GOAL:   Patient will meet greater than or equal to 90% of their needs  MONITOR:   PO intake, Supplement acceptance, Labs, Weight trends, Skin, I & O's  REASON FOR ASSESSMENT:   Malnutrition Screening Tool    ASSESSMENT:   86 y.o. female with medical history significant for dementia, DM, HTN, recent hip fx in March s/p repair who presents from assisted living facility with 1 week history of decreased interactiveness from baseline, not eating or drinking and sleeping throughout the day. Pt found to have UTI and sepsis  Met with pt in room today. Pt is a poor historian and unable to provide much history but does tell me that she feels like she is eating ok in hospital and she does enjoy the Ensure supplements. Pt is documented to have eaten 75% of her breakfast today and ate ~40% of her lunch along with 3/4 of an Ensure. Pt weighed in room today at 53.1kg. Per chart, it appears that pt is down 28lbs(19%) from her UBW of 145lbs. RD is unsure of how recently weight loss occurred as pt's last few documented weights appear to be reported and not measured. Pt does meet criteria for severe malnutrition today. RD will add supplements and MVI to help pt meet her estimated needs. Pt is currently refeeding; electrolytes being replaced. Recommend continue to monitor electrolytes until stable.   Medications reviewed and include:  cinacalcet, lovenox, insulin, remeron, ceftriaxone, KPhos  Labs reviewed: Na 134(L), K 2.8(L), P 1.6(L), Mg 1.3(L)  NUTRITION - FOCUSED PHYSICAL EXAM:    Most Recent Value  Orbital Region Severe depletion  Upper Arm Region Severe depletion  Thoracic and Lumbar Region Severe depletion  Buccal Region Severe depletion  Temple Region Severe depletion  Clavicle Bone Region Severe depletion  Clavicle and Acromion Bone Region Severe depletion  Scapular Bone Region Severe depletion  Dorsal Hand Severe depletion  Patellar Region Severe depletion  Anterior Thigh Region Severe depletion  Posterior Calf Region Severe depletion  Edema (RD Assessment) None  Hair Reviewed  Eyes Reviewed  Mouth Reviewed  Skin Reviewed  Nails Reviewed     Diet Order:   Diet Order            DIET DYS 2 Room service appropriate? Yes; Fluid consistency: Thin  Diet effective now                EDUCATION NEEDS:   No education needs have been identified at this time  Skin:  Skin Assessment: Reviewed RN Assessment (ecchymosis)  Last BM:  pta  Height:   Ht Readings from Last 1 Encounters:  08/21/19 '5\' 9"'$  (1.753 m)    Weight:   Wt Readings from Last 1 Encounters:  08/23/19 53.1 kg    Ideal Body Weight:  65.9 kg  BMI:  Body mass index is 17.29 kg/m.  Estimated Nutritional Needs:   Kcal:  1400-1600kcal/day  Protein:  70-80g/day  Fluid:  1.3-1.6L/day  Shadow Stiggers MS, RD, LDN Please refer to AMION for RD and/or RD on-call/weekend/after hours pager  

## 2019-08-24 LAB — BASIC METABOLIC PANEL
Anion gap: 12 (ref 5–15)
BUN: 13 mg/dL (ref 8–23)
CO2: 28 mmol/L (ref 22–32)
Calcium: 10 mg/dL (ref 8.9–10.3)
Chloride: 99 mmol/L (ref 98–111)
Creatinine, Ser: 0.69 mg/dL (ref 0.44–1.00)
GFR calc Af Amer: >60 mL/min (ref >60)
GFR calc non Af Amer: >60 mL/min (ref >60)
Glucose, Bld: 132 mg/dL — ABNORMAL HIGH (ref 70–99)
Potassium: 3.9 mmol/L (ref 3.5–5.1)
Sodium: 139 mmol/L (ref 135–145)

## 2019-08-24 LAB — MAGNESIUM: Magnesium: 1.6 mg/dL — ABNORMAL LOW (ref 1.7–2.4)

## 2019-08-24 LAB — GLUCOSE, CAPILLARY
Glucose-Capillary: 148 mg/dL — ABNORMAL HIGH (ref 70–99)
Glucose-Capillary: 283 mg/dL — ABNORMAL HIGH (ref 70–99)
Glucose-Capillary: 253 mg/dL — ABNORMAL HIGH (ref 70–99)
Glucose-Capillary: 175 mg/dL — ABNORMAL HIGH (ref 70–99)

## 2019-08-24 LAB — URINE CULTURE: Culture: >=100000 — AB

## 2019-08-24 LAB — PHOSPHORUS: Phosphorus: 2.4 mg/dL — ABNORMAL LOW (ref 2.5–4.6)

## 2019-08-24 MED ORDER — HYDROXYZINE HCL 10 MG PO TABS
10.0000 mg | ORAL_TABLET | Freq: Four times a day (QID) | ORAL | Status: DC | PRN
  Administered 2019-08-24: 04:00:00 10 mg via ORAL
  Filled 2019-08-24 (×2): qty 1

## 2019-08-24 MED ORDER — MAGNESIUM SULFATE 2 GM/50ML IV SOLN
2.0000 g | Freq: Once | INTRAVENOUS | Status: AC
  Administered 2019-08-24: 2 g via INTRAVENOUS
  Filled 2019-08-24: qty 50

## 2019-08-24 MED ORDER — SODIUM PHOSPHATES 45 MMOLE/15ML IV SOLN
15.0000 mmol | Freq: Once | INTRAVENOUS | Status: AC
  Administered 2019-08-24: 15 mmol via INTRAVENOUS
  Filled 2019-08-24: qty 5

## 2019-08-24 NOTE — Evaluation (Signed)
Physical Therapy Evaluation Patient Details Name: Jenna Robinson MRN: [id number] DOB: 1925/05/17 Today's Date: 08/24/2019   History of Present Illness  Pt is a 86 y.o. female w/ PMH of dementia, DM, HTN, and R femur IM nailing 04/08/19. Per MD impression, pt currently presents with: UTI w/ possible sepsis, acute metabolic encephalopathy, inadequate oral intake, dementia w/o behavioral disturbance, lactic acidosis, and DM w/o complication.  Clinical Impression  Pt was pleasant and motivated to participate during the session. Pt found sleeping but easily aroused; oriented to person and pleasantly confused throughout session. Pt able to perform supine exercises easily w/ initial tactile cueing. Pt demonstrated mod-I w/ bed mobility today and required extra time and effort. Pt required CGA to sit-to-stand from a minimally elevated surface as well as min cueing for hand positioning. Pt able to ambulate 77ft w/ RW and CGA: pt ambulated w/ a very slow, shuffled, gait pattern and globally flexed posture. Pt required BUE support on RW and appeared unsteady in standing w/o BUE support. Discussed pt's PLOF w/ [facility]; indicated pt has a significant fall history. Pt will benefit from HHPT services upon discharge to safely address deficits listed in patient problem list for decreased caregiver assistance and eventual return to PLOF.     Follow Up Recommendations Home health PT;Supervision/Assistance - 24 hour    Equipment Recommendations  None recommended by PT    Recommendations for Other Services       Precautions / Restrictions Precautions Precautions: Fall Restrictions Weight Bearing Restrictions: No      Mobility  Bed Mobility Overal bed mobility: Modified Independent             General bed mobility comments: extra time and effort  Transfers Overall transfer level: Needs assistance Equipment used: Rolling walker (2 wheeled) Transfers: Sit to/from Stand Sit to Stand:  Min guard;From elevated surface         General transfer comment: Pt required no physical assist from a minimally elevated surface. CGA provided throughout as well as min cueing for hand positioning  Ambulation/Gait Ambulation/Gait assistance: Min guard Gait Distance (Feet): 40 Feet Assistive device: Rolling walker (2 wheeled) Gait Pattern/deviations: Step-through pattern;Decreased step length - right;Decreased step length - left;Shuffle;Trunk flexed Gait velocity: decreased   General Gait Details: Pt ambulated w/ a very slow, shuffled, gait pattern with trunk flexed. Pt appeared slightly SOB at times but continued to report that she felt fine and that her legs just felt a little weak  Stairs            Wheelchair Mobility    Modified Rankin (Stroke Patients Only)       Balance Overall balance assessment: Needs assistance Sitting-balance support: Feet supported Sitting balance-Leahy Scale: Good       Standing balance-Leahy Scale: Fair Standing balance comment: heavy BUE support on RW; fairly rigid, globally flexed posture. Able to shift weight onto BLE but generally appeared unsteady                             Pertinent Vitals/Pain Pain Assessment: No/denies pain    Home Living Family/patient expects to be discharged to:: Assisted living               Home Equipment: Walker - 2 wheels;Bedside commode Additional Comments: [facility]    Prior Function Level of Independence: Needs assistance   Gait / Transfers Assistance Needed: Per [facility]: pt requires min A w/ bed mobility and  transfers     Comments: Pt poor historian, states she lives "here and there" and that she used to walk alot before she fell and hit her head. Per [facility], pt typically ambulates w/ a 2WW and CGA to min-A; able to walk facility distances. [facility] stated pt has had frequent falls due to LOB     Hand Dominance        Extremity/Trunk  Assessment   Upper Extremity Assessment Upper Extremity Assessment: Generalized weakness    Lower Extremity Assessment Lower Extremity Assessment: Generalized weakness       Communication   Communication: No difficulties  Cognition Arousal/Alertness: Awake/alert Behavior During Therapy: WFL for tasks assessed/performed Overall Cognitive Status: History of cognitive impairments - at baseline                                 General Comments: Pt sleeping at beginning of session, easily aroused and eager to participate. Pt oriented to person; doesn't really seem to know where she is, but stated "they keep telling me I am in the hospital". Pleasantly confused throughout session.      General Comments      Exercises Total Joint Exercises Ankle Circles/Pumps: AROM;Strengthening;Both;10 reps Hip ABduction/ADduction: AROM;Strengthening;Both;10 reps Straight Leg Raises: AROM;Strengthening;Both;10 reps;15 reps (unable to follow cues for true SLR; more of a bicycle pedal motion) Marching in Standing: AROM;Strengthening;Both;5 reps;Standing   Assessment/Plan    PT Assessment Patient needs continued PT services  PT Problem List Decreased strength;Decreased activity tolerance;Decreased balance;Decreased knowledge of use of DME;Decreased mobility       PT Treatment Interventions DME instruction;Gait training;Therapeutic activities;Functional mobility training;Therapeutic exercise;Balance training;Patient/family education    PT Goals (Current goals can be found in the Care Plan section)  Acute Rehab PT Goals PT Goal Formulation: Patient unable to participate in goal setting    Frequency Min 2X/week   Barriers to discharge        Co-evaluation               AM-PAC PT "6 Clicks" Mobility  Outcome Measure Help needed turning from your back to your side while in a flat bed without using bedrails?: None Help needed moving from lying on your back to sitting on the  side of a flat bed without using bedrails?: None Help needed moving to and from a bed to a chair (including a wheelchair)?: A Little Help needed standing up from a chair using your arms (e.g., wheelchair or bedside chair)?: A Little Help needed to walk in hospital room?: A Little Help needed climbing 3-5 steps with a railing? : Total 6 Click Score: 18    End of Session Equipment Utilized During Treatment: Gait belt Activity Tolerance: Patient tolerated treatment well Patient left: in chair;with nursing/sitter in room Nurse Communication: Mobility status PT Visit Diagnosis: Unsteadiness on feet (R26.81);Muscle weakness (generalized) (M62.81);Repeated falls (R29.6);History of falling (Z91.81);Difficulty in walking, not elsewhere classified (R26.2)    Time: 4081-4481 PT Time Calculation (min) (ACUTE ONLY): 30 min   Charges:              Thereasa Distance SPT 08/24/19, 12:56 PM

## 2019-08-24 NOTE — Plan of Care (Signed)

## 2019-08-24 NOTE — TOC Initial Note (Signed)
Transition of Care [facility]) - Initial/Assessment Note    Patient Details  Name: Jenna Robinson MRN: [id number] Date of Birth: 1925/08/28  Transition [facility] Care [facility]) CM/SW Contact:    Lucy Chris, LCSW Phone Number: 08/24/2019, 1:42 PM  Clinical Narrative:  Pt is from [facility] and will return once medically stable. Her niece-[name] is her POA and is aware plan to return tomorrow. Have contacted Tammy-Adm to make aware she will need new FL2 and reports HHPT pref is [facility]. Have contacted Michelle[facility] who is aware and will follow once returns. SW to follow up with tomorrow. Pt has been vaccinated and will not need another COVID test, according to Tammy.            Expected Discharge Plan: Assisted Living Barriers to Discharge: Continued Medical Work up   Patient Goals and CMS Choice   CMS Medicare.gov Compare Post Acute Care list provided to:: Patient Represenative (must comment) ([name]-POA/niece) Choice offered to / list presented to : [facility] POA / Guardian  Expected Discharge Plan and Services Expected Discharge Plan: Assisted Living In-house Referral: Clinical Social Work   Post Acute Care Choice: Home Health Living arrangements for the past 2 months: Assisted Living Facility                           HH Arranged: PT [facility] Agency: [facility] Date [facility] Agency Contacted: 08/24/19 Time HH Agency Contacted: 1341 Representative spoke with at [facility] Agency: Marcelino Duster  Prior Living Arrangements/Services Living arrangements for the past 2 months: Assisted Living Facility Lives with:: Facility Resident Patient language and need for interpreter reviewed:: No Do you feel safe going back to the place where you live?: Yes      Need for Family Participation in Patient Care: Yes (Comment) Care giver support system in place?: Yes (comment)   Criminal Activity/Legal Involvement Pertinent to Current Situation/Hospitalization: No - Comment as needed  Activities of Daily  Living Home Assistive Devices/Equipment: Cane (specify quad or straight) ADL Screening (condition at time of admission) Patient's cognitive ability adequate to safely complete daily activities?: No Is the patient deaf or have difficulty hearing?: No Does the patient have difficulty seeing, even when wearing glasses/contacts?: No Does the patient have difficulty concentrating, remembering, or making decisions?: Yes Patient able to express need for assistance with ADLs?: Yes Does the patient have difficulty dressing or bathing?: Yes Independently performs ADLs?: No Communication: Dependent Is this a change from baseline?: Pre-admission baseline Dressing (OT): Dependent Is this a change from baseline?: Pre-admission baseline Grooming: Dependent Is this a change from baseline?: Pre-admission baseline Feeding: Needs assistance Is this a change from baseline?: Pre-admission baseline Bathing: Dependent Is this a change from baseline?: Pre-admission baseline Toileting: Dependent Is this a change from baseline?: Pre-admission baseline In/Out Bed: Dependent Is this a change from baseline?: Pre-admission baseline Walks in Home: Independent with device (comment) Does the patient have difficulty walking or climbing stairs?: Yes Weakness of Legs: Both Weakness of Arms/Hands: Both  Permission Sought/Granted Permission sought to share information with : Facility Medical sales representative, Family Supports Permission granted to share information with : Yes, Verbal Permission Granted  Share Information with NAME: [name]  Permission granted to share info w AGENCY: [facility]  Permission granted to share info w Relationship: Niece-POA  Permission granted to share info w Contact Information: Marcelino Duster  Emotional Assessment Appearance:: Appears stated age Attitude/Demeanor/Rapport: Unable to Assess (confused-oriented to self) Affect (typically observed): Calm Orientation: : Oriented to Self  Psych  Involvement: No (comment)  Admission diagnosis:  Severe dehydration [E86.0] Failure to thrive (0-17) [R62.51] Sepsis due to urinary tract infection (HCC) [A41.9, N39.0] Sepsis (HCC) [A41.9] Patient Active Problem List   Diagnosis Date Noted  . Protein-calorie malnutrition, severe 08/23/2019  . Lactic acidosis 08/22/2019  . Sepsis secondary to UTI (HCC) 08/22/2019  . Acute metabolic encephalopathy 08/22/2019  . Inadequate oral intake 08/22/2019  . Dementia without behavioral disturbance (HCC) 08/22/2019  . Diabetes mellitus without complication (HCC) 08/22/2019  . Closed right hip fracture (HCC) 04/08/2019  . Hypercalcemia 05/22/2015  . Elevated troponin 01/02/2015  . Rhabdomyolysis 01/02/2015   PCP:  Mickel Fuchs, MD Pharmacy:   [facility] - [place], [place] - [address] [place] [address] Phone: [phone] Fax: [phone]     Social Determinants of Health (SDOH) Interventions    Readmission Risk Interventions No flowsheet data found.

## 2019-08-24 NOTE — Progress Notes (Signed)
PROGRESS NOTE    Jenna Robinson  DGU:[id number] DOB: 1925/06/07 DOA: 08/21/2019 PCP: Mickel Fuchs, MD   Brief Narrative: 86 year old with past medical history significant for Alzheimer's dementia, diabetes type 2, hypothyroidism and hypertension brought to the ED due to altered mental status and poor oral intake.  In the ED, hemodynamically stable, lactic acid 9.1 subsequently decreased to 6.1.  Calcium 12.5, anion gap 17.  Influenza PCR negative.  UA concerning for UTI.  Chest x-ray and EKG without acute finding. Patient was admitted for acute metabolic encephalopathy possible UTI.  She was a started on IV ceftriaxone.    Assessment & Plan:   Principal Problem:   Sepsis secondary to UTI [facility]) Active Problems:   Lactic acidosis   Acute metabolic encephalopathy   Inadequate oral intake   Dementia without behavioral disturbance (HCC)   Diabetes mellitus without complication (HCC)   Protein-calorie malnutrition, severe  1-Acute metabolic encephalopathy: Patient with a prior history of Alzheimer's dementia. -Suspect related to dehydration, poor oral intake, infectious process. -Patient currently is alert and oriented to place and person. -Delirium precaution -Continue treatment of UTI - Patient is alert, conversant. Following command.   2-Dehydration: Secondary to poor oral intake treated with IV fluids. Ate most of her breakfast today.   3-Lactic acidosis: Likely from dehydration and Metformin versus  Infection.  Resolved with IV fluids  4-Hypercalcemia: Likely secondary to hypovolemia Treated with IV fluids.  Diabetes type 2: On Metformin. May need to discontinue Metformin at discharge given lactic acidosis  UTI: UA; 21-50 white blood cell Urine culture growing gram-negative rods, E coli. Sensitive to ceftriaxone.  Continue with IV ceftriaxone  Hypomagnesemia;  Replete IV.   Hypophosphatemia; replete IV   Hypokalemia; Resolved  Urinary incontinence: On  oxybutynin.  Essential hypertension: Continue with lisinopril and atenolol  Estimated body mass index is 17.29 kg/m as calculated from the following:   Height as of this encounter: 5\' 9"  (1.753 m).   Weight as of this encounter: 53.1 kg.   DVT prophylaxis: Lovenox Code Status: DNR Family Communication: No family at bedside Disposition Plan:  Status is: Inpatient  Remains inpatient appropriate because:Persistent severe electrolyte disturbances   Dispo: The patient is from: ALF              Anticipated d/c is to: ALF              Anticipated d/c date is: 2 days              Patient currently is not medically stable to d/c.  Replete electrolytes, follow urine culture.  PT eval        Consultants:   None  Procedures:   None  Antimicrobials:  Ceftriaxone   Subjective: She is alert, conversant.  She ate all her breakfast.  She was confuse last night. Has sitter at bedside. Will dc sitter today if possible.   Objective: Vitals:   08/23/19 2359 08/24/19 0420 08/24/19 0730 08/24/19 1243  BP: (!) 162/77 134/87  102/76  Pulse: 86 73 80 79  Resp: 16 20  18   Temp: 98 F (36.7 C) 97.7 F (36.5 C) 97.6 F (36.4 C) 98.6 F (37 C)  TempSrc: Oral  Oral Oral  SpO2: 100% 100% 99% 99%  Weight:      Height:        Intake/Output Summary (Last 24 hours) at 08/24/2019 1518 Last data filed at 08/24/2019 1300 Gross per 24 hour  Intake 400 ml  Output --  Net 400 ml   Filed Weights   08/21/19 1816 08/23/19 1413  Weight: 59 kg 53.1 kg    Examination:  General exam: NAD Respiratory system:  CTA Cardiovascular system: S 1, S 2 RRR Gastrointestinal system: BS present, soft,  nt Central nervous system: alert, following command Extremities: no edema  Data Reviewed: I have personally reviewed following labs and imaging studies  CBC: Recent Labs  Lab 08/21/19 1837 08/22/19 0211 08/23/19 0523  WBC 9.1 8.8 8.0  NEUTROABS 6.2  --   --   HGB 13.6 13.6 12.7  HCT  41.7 41.4 36.7  MCV 91.2 90.8 86.8  PLT 236 221 180   Basic Metabolic Panel: Recent Labs  Lab 08/21/19 1837 08/22/19 0211 08/23/19 0523 08/24/19 0524  NA 136 136 134* 139  K 4.5 3.8 2.8* 3.9  CL 91* 94* 95* 99  CO2 28 29 29 28   GLUCOSE 115* 110* 98 132*  BUN 33* 28* 13 13  CREATININE 0.94 0.85 0.58 0.69  CALCIUM 12.5* 11.9* 9.9 10.0  MG  --   --  1.3* 1.6*  PHOS  --   --  1.6* 2.4*   GFR: Estimated Creatinine Clearance: 38.4 mL/min (by C-G formula based on SCr of 0.69 mg/dL). Liver Function Tests: Recent Labs  Lab 08/21/19 1837 08/23/19 0523  AST 36  --   ALT 23  --   ALKPHOS 53  --   BILITOT 0.9  --   PROT 7.5  --   ALBUMIN 4.4 4.0   No results for input(s): LIPASE, AMYLASE in the last 168 hours. No results for input(s): AMMONIA in the last 168 hours. Coagulation Profile: Recent Labs  Lab 08/22/19 0206 08/22/19 0636  INR 1.0 1.1   Cardiac Enzymes: No results for input(s): CKTOTAL, CKMB, CKMBINDEX, TROPONINI in the last 168 hours. BNP (last 3 results) No results for input(s): PROBNP in the last 8760 hours. HbA1C: Recent Labs    08/22/19 0211  HGBA1C 6.0*   CBG: Recent Labs  Lab 08/23/19 1140 08/23/19 1613 08/23/19 2212 08/24/19 0734 08/24/19 1250  GLUCAP 141* 159* 145* 148* 283*   Lipid Profile: No results for input(s): CHOL, HDL, LDLCALC, TRIG, CHOLHDL, LDLDIRECT in the last 72 hours. Thyroid Function Tests: No results for input(s): TSH, T4TOTAL, FREET4, T3FREE, THYROIDAB in the last 72 hours. Anemia Panel: No results for input(s): VITAMINB12, FOLATE, FERRITIN, TIBC, IRON, RETICCTPCT in the last 72 hours. Sepsis Labs: Recent Labs  Lab 08/21/19 1837 08/22/19 0015 08/22/19 0211 08/22/19 0924  PROCALCITON  --   --  0.26  --   LATICACIDVEN 9.1* 6.1*  --  1.8    Recent Results (from the past 240 hour(s))  Resp Panel by RT PCR (RSV, Flu A&B, Covid) - Nasopharyngeal Swab     Status: None   Collection Time: 08/21/19 10:27 PM   Specimen:  Nasopharyngeal Swab  Result Value Ref Range Status   SARS Coronavirus 2 by RT PCR NEGATIVE NEGATIVE Final    Comment: (NOTE) SARS-CoV-2 target nucleic acids are NOT DETECTED.  The SARS-CoV-2 RNA is generally detectable in upper respiratoy specimens during the acute phase of infection. The lowest concentration of SARS-CoV-2 viral copies this assay can detect is 131 copies/mL. A negative result does not preclude SARS-Cov-2 infection and should not be used as the sole basis for treatment or other patient management decisions. A negative result may occur with  improper specimen collection/handling, submission of specimen other than nasopharyngeal swab, presence of viral mutation(s) within the areas  targeted by this assay, and inadequate number of viral copies (<131 copies/mL). A negative result must be combined with clinical observations, patient history, and epidemiological information. The expected result is Negative.  Fact Sheet for Patients:  https://www.moore.com/https://www.fda.gov/media/142436/download  Fact Sheet for Healthcare Providers:  https://www.young.biz/https://www.fda.gov/media/142435/download  This test is no t yet approved or cleared by the [place] FDA and  has been authorized for detection and/or diagnosis of SARS-CoV-2 by FDA under an Emergency Use Authorization (EUA). This EUA will remain  in effect (meaning this test can be used) for the duration of the COVID-19 declaration under Section 564(b)(1) of the Act, 21 U.S.C. section 360bbb-3(b)(1), unless the authorization is terminated or revoked sooner.     Influenza A by PCR NEGATIVE NEGATIVE Final   Influenza B by PCR NEGATIVE NEGATIVE Final    Comment: (NOTE) The Xpert Xpress SARS-CoV-2/FLU/RSV assay is intended as an aid in  the diagnosis of influenza from Nasopharyngeal swab specimens and  should not be used as a sole basis for treatment. Nasal washings and  aspirates are unacceptable for Xpert Xpress SARS-CoV-2/FLU/RSV  testing.  Fact Sheet  for Patients: https://www.moore.com/https://www.fda.gov/media/142436/download  Fact Sheet for Healthcare Providers: https://www.young.biz/https://www.fda.gov/media/142435/download  This test is not yet approved or cleared by the [place] FDA and  has been authorized for detection and/or diagnosis of SARS-CoV-2 by  FDA under an Emergency Use Authorization (EUA). This EUA will remain  in effect (meaning this test can be used) for the duration of the  Covid-19 declaration under Section 564(b)(1) of the Act, 21  U.S.C. section 360bbb-3(b)(1), unless the authorization is  terminated or revoked.    Respiratory Syncytial Virus by PCR NEGATIVE NEGATIVE Final    Comment: (NOTE) Fact Sheet for Patients: https://www.moore.com/https://www.fda.gov/media/142436/download  Fact Sheet for Healthcare Providers: https://www.young.biz/https://www.fda.gov/media/142435/download  This test is not yet approved or cleared by the [place] FDA and  has been authorized for detection and/or diagnosis of SARS-CoV-2 by  FDA under an Emergency Use Authorization (EUA). This EUA will remain  in effect (meaning this test can be used) for the duration of the  COVID-19 declaration under Section 564(b)(1) of the Act, 21 U.S.C.  section 360bbb-3(b)(1), unless the authorization is terminated or  revoked. Performed at [facility], [address] [place], [place] [address]   Urine culture     Status: Abnormal   Collection Time: 08/22/19 12:15 AM   Specimen: Urine, Random  Result Value Ref Range Status   Specimen Description   Final    URINE, RANDOM Performed at [facility], [address] [place], [place] [address]    Special Requests   Final    NONE Performed at [facility], [address] [place], [place] [address]    Culture >=100,000 COLONIES/mL ESCHERICHIA COLI (A)  Final   Report Status 08/24/2019 FINAL  Final   Organism ID, Bacteria ESCHERICHIA COLI (A)  Final      Susceptibility   Escherichia coli - MIC*    AMPICILLIN >=32 RESISTANT Resistant      CEFAZOLIN <=4 SENSITIVE Sensitive     CEFTRIAXONE <=0.25 SENSITIVE Sensitive     CIPROFLOXACIN >=4 RESISTANT Resistant     GENTAMICIN <=1 SENSITIVE Sensitive     IMIPENEM <=0.25 SENSITIVE Sensitive     NITROFURANTOIN <=16 SENSITIVE Sensitive     TRIMETH/SULFA >=320 RESISTANT Resistant     AMPICILLIN/SULBACTAM 16 INTERMEDIATE Intermediate     PIP/TAZO <=4 SENSITIVE Sensitive     * >=100,000 COLONIES/mL ESCHERICHIA COLI  Blood culture (routine  x 2)     Status: None (Preliminary result)   Collection Time: 08/22/19  2:06 AM   Specimen: BLOOD  Result Value Ref Range Status   Specimen Description BLOOD LAC  Final   Special Requests BOTTLES DRAWN AEROBIC AND ANAEROBIC BCAV  Final   Culture   Final    NO GROWTH 2 DAYS Performed at [facility], [address], [place], [place] [address]    Report Status PENDING  Incomplete  Blood culture (routine x 2)     Status: None (Preliminary result)   Collection Time: 08/22/19  2:06 AM   Specimen: BLOOD  Result Value Ref Range Status   Specimen Description BLOOD LFOA  Final   Special Requests BOTTLES DRAWN AEROBIC AND ANAEROBIC BCAV  Final   Culture   Final    NO GROWTH 2 DAYS Performed at [facility], [address], [place], [place] [address]    Report Status PENDING  Incomplete         Radiology Studies: No results found.      Scheduled Meds: . atenolol  25 mg Oral Daily  . atorvastatin  20 mg Oral Daily  . cinacalcet  30 mg Oral Q M,W,F  . enoxaparin (LOVENOX) injection  40 mg Subcutaneous Q24H  . feeding supplement (ENSURE ENLIVE)  237 mL Oral BID BM  . insulin aspart  0-5 Units Subcutaneous QHS  . insulin aspart  0-9 Units Subcutaneous TID WC  . lisinopril  10 mg Oral Daily  . mirtazapine  7.5 mg Oral QHS  . multivitamin with minerals  1 tablet Oral Daily  . mupirocin ointment  1 application Nasal BID  . oxybutynin  5 mg Oral BID   Continuous Infusions: . cefTRIAXone (ROCEPHIN)  IV 1 g  (08/23/19 2307)  . sodium phosphate  Dextrose 5% IVPB 15 mmol (08/24/19 1138)     LOS: 2 days    Time spent: 35 minutes.     Alba Cory, MD [facility]   If 7PM-7AM, please contact night-coverage [email or web address]  08/24/2019, 3:18 PM

## 2019-08-24 NOTE — Evaluation (Signed)
Occupational Therapy Evaluation Patient Details Name: Jenna Robinson MRN: [id number] DOB: 1925/07/20 Today's Date: 08/24/2019    History of Present Illness Pt is a 86 y.o. female w/ PMH of dementia, DM, HTN, and R femur IM nailing 04/08/19. Per MD impression, pt currently presents with: UTI w/ possible sepsis, acute metabolic encephalopathy, inadequate oral intake, dementia w/o behavioral disturbance, lactic acidosis, and DM w/o complication.   Clinical Impression   Pt was seen for OT evaluation this date. Prior to hospital admission, pt's PLOF unclear as she is questionable historian, but she reports being able to walk with RW. Pt lives in ALF. Currently pt demonstrates impairments as described below (See OT problem list) which functionally limit her ability to perform ADL/self-care tasks. Pt currently requires MIN A with ADL transfers and fxl mobility with RW, MIN/MOD A with LB dressing, and MOD A with thorough peri care after toileting.  Pt would benefit from skilled OT to address noted impairments and functional limitations (see below for any additional details) in order to maximize safety and independence while minimizing falls risk and caregiver burden. Upon hospital discharge, recommend HHOT and 24/7 SUPV to maximize pt safety and return to functional independence during meaningful occupations of daily life.     Follow Up Recommendations  Home health OT;Supervision/Assistance - 24 hour    Equipment Recommendations  3 in 1 bedside commode;Tub/shower seat;Other (comment) (grab bars in shower)    Recommendations for Other Services       Precautions / Restrictions Precautions Precautions: Fall Restrictions Weight Bearing Restrictions: No      Mobility Bed Mobility Overal bed mobility: Modified Independent             General bed mobility comments: extra time and effort  Transfers Overall transfer level: Needs assistance Equipment used: Rolling walker (2  wheeled) Transfers: Sit to/from [employer] Sit to Stand: Min assist Stand pivot transfers: Min assist       General transfer comment: MIN verbal cues for safe hand placement with RW.    Balance Overall balance assessment: Needs assistance Sitting-balance support: Feet supported Sitting balance-Leahy Scale: Good       Standing balance-Leahy Scale: Fair Standing balance comment: heavy reliance on RW for UE support                           ADL either performed or assessed with clinical judgement   ADL Overall ADL's : Needs assistance/impaired     Grooming: Wash/dry hands;Independent;Sitting;Minimal assistance;Standing           Upper Body Dressing : Set up;Sitting   Lower Body Dressing: Minimal assistance;Moderate assistance;Sit to/from stand   Toilet Transfer: Minimal assistance;Ambulation;RW;Grab bars;BSC;Regular Teacher, adult education Details (indicate cue type and reason): BSC over standard commode in restroom, RW with MIN A for fxl mobility to restroom and MIN A for transfer. MIN verbal cues for safe hand placement. Poor eccentri control, potentially d/t being in a rush to have BM. Toileting- Clothing Manipulation and Hygiene: Moderate assistance;Sit to/from stand Toileting - Clothing Manipulation Details (indicate cue type and reason): after BM, pt attempts peri care with sit/lateral lean method, requires MOD A in standing for thorough completion     Functional mobility during ADLs: Minimal assistance;Rolling walker       Vision Baseline Vision/History: Wears glasses Wears Glasses: At all times Patient Visual Report: No change from baseline Additional Comments: states she cannot find her glasses at this time  Perception     Praxis      Pertinent Vitals/Pain Pain Assessment: No/denies pain     Hand Dominance     Extremity/Trunk Assessment Upper Extremity Assessment Upper Extremity Assessment: Overall WFL for tasks  assessed;Generalized weakness (ROM WFL, MMT grossly 4-/5)   Lower Extremity Assessment Lower Extremity Assessment: Defer to PT evaluation;Generalized weakness       Communication Communication Communication: No difficulties   Cognition Arousal/Alertness: Awake/alert Behavior During Therapy: WFL for tasks assessed/performed Overall Cognitive Status: History of cognitive impairments - at baseline                                 General Comments: Pt appropriate with all command/cue following, oriented to self only. Friendly and pleasant.   General Comments       Exercises Other Exercises Other Exercises: OT educates re: fall prevention: use of call light, use of RW (safe hand placement), notified of use of bed alarm once sitter is d/c'ed. Pt with some recall within 5 minutes, little recall at end of session   Shoulder Instructions      Home Living Family/patient expects to be discharged to:: Assisted living                             Home Equipment: Walker - 2 wheels;Bedside commode   Additional Comments: [facility] assisted [facility]      Prior Functioning/Environment Level of Independence: Needs assistance  Gait / Transfers Assistance Needed: Per [facility]: pt requires min A w/ bed mobility and transfers     Comments: Pt poor historian, states she lives "here and there" and that she used to walk alot before she fell and hit her head. Per [facility], pt typically ambulates w/ a 2WW and CGA to min-A; able to walk facility distances. [facility] stated pt has had frequent falls due to LOB        OT Problem List: Decreased strength;Decreased activity tolerance;Impaired balance (sitting and/or standing);Decreased safety awareness;Decreased knowledge of use of DME or AE      OT Treatment/Interventions: Self-care/ADL training;Therapeutic exercise;DME and/or AE instruction;Therapeutic activities;Balance training;Energy conservation;Patient/family  education    OT Goals(Current goals can be found in the care plan section) Acute Rehab OT Goals Patient Stated Goal: to go home OT Goal Formulation: With patient Time For Goal Achievement: 09/07/19 Potential to Achieve Goals: Good ADL Goals Pt Will Perform Grooming: with supervision;standing (with RW) Pt Will Perform Lower Body Dressing: with supervision;sit to/from stand Pt Will Transfer to Toilet: with supervision;ambulating Pt Will Perform Toileting - Clothing Manipulation and hygiene: with supervision;sit to/from stand  OT Frequency: Min 1X/week   Barriers to D/C:            Co-evaluation              AM-PAC OT "6 Clicks" Daily Activity     Outcome Measure Help from another person eating meals?: None Help from another person taking care of personal grooming?: A Little Help from another person toileting, which includes using toliet, bedpan, or urinal?: A Lot Help from another person bathing (including washing, rinsing, drying)?: A Lot Help from another person to put on and taking off regular upper body clothing?: A Little Help from another person to put on and taking off regular lower body clothing?: A Lot 6 Click Score: 16   End of Session Equipment Utilized During Treatment: Gait belt;Rolling walker  Nurse Communication: Other (comment) (notified of BM)  Activity Tolerance: Patient tolerated treatment well Patient left: in bed;with call bell/phone within reach;with nursing/sitter in room (sitter present, but RN reports to d/c soon and bed alarm to be set)  OT Visit Diagnosis: Unsteadiness on feet (R26.81);Muscle weakness (generalized) (M62.81)                Time: 0354-6568 OT Time Calculation (min): 27 min Charges:  OT General Charges $OT Visit: 1 Visit OT Evaluation $OT Eval Moderate Complexity: 1 Mod OT Treatments $Self Care/Home Management : 8-22 mins  Rejeana Brock, MS, OTR/L ascom [phone] 08/24/19, 3:14 PM

## 2019-08-24 NOTE — Progress Notes (Signed)
Discontinue tele monitor per Dr Sunnie Nielsen

## 2019-08-24 NOTE — NC FL2 (Signed)
[place] MEDICA[place] FL2 LEVEL OF CARE SCREENING TOOL     IDENTIFICATION  Patient Name: Jenna Robinson Birthdate: 1925/02/20 Sex: female Admission Date (Current Location): 08/21/2019  [place] and [place] Number:  Chiropodist and Address:  [facility], [address], [place], [place] [address]      Provider Number: [id number]  Attending Physician Name and Address:  Alba Cory, MD  Relative Name and Phone Number:  Shelia Media [phone]    Current Level of Care: Hospital Recommended Level of Care: Assisted Living Facility Prior Approval Number:    Date Approved/Denied:   PASRR Number:    Discharge Plan: Other (Comment) (Assisted Living facility)    Current Diagnoses: Patient Active Problem List   Diagnosis Date Noted  . Protein-calorie malnutrition, severe 08/23/2019  . Lactic acidosis 08/22/2019  . Sepsis secondary to UTI (HCC) 08/22/2019  . Acute metabolic encephalopathy 08/22/2019  . Inadequate oral intake 08/22/2019  . Dementia without behavioral disturbance (HCC) 08/22/2019  . Diabetes mellitus without complication (HCC) 08/22/2019  . Closed right hip fracture (HCC) 04/08/2019  . Hypercalcemia 05/22/2015  . Elevated troponin 01/02/2015  . Rhabdomyolysis 01/02/2015    Orientation RESPIRATION BLADDER Height & Weight     Self, Place  Normal External catheter Weight: 117 lb 1 oz (53.1 kg) Height:  5\' 9"  (175.3 cm)  BEHAVIORAL SYMPTOMS/MOOD NEUROLOGICAL BOWEL NUTRITION STATUS      Continent Diet (Dys 2 thin liquids)  AMBULATORY STATUS COMMUNICATION OF NEEDS Skin   Extensive Assist Verbally Normal                       Personal Care Assistance Level of Assistance  Bathing, Dressing Bathing Assistance: Limited assistance   Dressing Assistance: Limited assistance     Functional Limitations Info             SPECIAL CARE FACTORS FREQUENCY  PT (By licensed PT)     PT Frequency: 3x  week              Contractures Contractures Info: Not present    Additional Factors Info  Code Status, Allergies Code Status Info: DNR Allergies Info: Pencillins           Current Medications (08/24/2019):  This is the current hospital active medication list Current Facility-Administered Medications  Medication Dose Route Frequency Provider Last Rate Last Admin  . acetaminophen (TYLENOL) tablet 650 mg  650 mg Oral Q6H PRN 10/24/2019, MD       Or  . acetaminophen (TYLENOL) suppository 650 mg  650 mg Rectal Q6H PRN Andris Baumann, MD      . atenolol (TENORMIN) tablet 25 mg  25 mg Oral Daily Andris Baumann T, MD   25 mg at 08/24/19 1138  . atorvastatin (LIPITOR) tablet 20 mg  20 mg Oral Daily 10/24/19 T, MD   20 mg at 08/24/19 0817  . cefTRIAXone (ROCEPHIN) 1 g in sodium chloride 0.9 % 100 mL IVPB  1 g Intravenous Q24H 10/24/19 V, MD 200 mL/hr at 08/23/19 2307 1 g at 08/23/19 2307  . cinacalcet (SENSIPAR) tablet 30 mg  30 mg Oral Q M,W,F 2308 T, MD   30 mg at 08/23/19 0803  . enoxaparin (LOVENOX) injection 40 mg  40 mg Subcutaneous Q24H 10/23/19, MD   40 mg at 08/24/19 0815  . feeding supplement (ENSURE ENLIVE) (ENSURE ENLIVE) liquid 237 mL  237 mL Oral BID BM  Regalado, Belkys A, MD   237 mL at 08/24/19 1318  . hydrOXYzine (ATARAX/VISTARIL) tablet 10 mg  10 mg Oral Q6H PRN Manuela Schwartz, NP   10 mg at 08/24/19 0348  . insulin aspart (novoLOG) injection 0-5 Units  0-5 Units Subcutaneous QHS Lindajo Royal V, MD      . insulin aspart (novoLOG) injection 0-9 Units  0-9 Units Subcutaneous TID WC Andris Baumann, MD   5 Units at 08/24/19 1314  . lisinopril (ZESTRIL) tablet 10 mg  10 mg Oral Daily Candelaria Stagers T, MD   10 mg at 08/24/19 0817  . mirtazapine (REMERON) tablet 7.5 mg  7.5 mg Oral QHS Regalado, Belkys A, MD   7.5 mg at 08/23/19 2015  . multivitamin with minerals tablet 1 tablet  1 tablet Oral Daily Regalado, Belkys A, MD   1 tablet at 08/24/19 0817  .  mupirocin ointment (BACTROBAN) 2 % 1 application  1 application Nasal BID Almon Hercules, MD   1 application at 08/24/19 0816  . ondansetron (ZOFRAN) tablet 4 mg  4 mg Oral Q6H PRN Andris Baumann, MD       Or  . ondansetron [facility]) injection 4 mg  4 mg Intravenous Q6H PRN Andris Baumann, MD      . oxybutynin (DITROPAN) tablet 5 mg  5 mg Oral BID Candelaria Stagers T, MD   5 mg at 08/24/19 0816  . sodium phosphate 15 mmol in dextrose 5 % 250 mL infusion  15 mmol Intravenous Once Regalado, Belkys A, MD 43 mL/hr at 08/24/19 1138 15 mmol at 08/24/19 1138     Discharge Medications: Please see discharge summary for a list of discharge medications.  Relevant Imaging Results:  Relevant Lab Results:   Additional Information SSN: [id number] has been vaccinated  [name], Lemar Livings, LCSW

## 2019-08-25 LAB — BASIC METABOLIC PANEL
Anion gap: 10 (ref 5–15)
BUN: 23 mg/dL (ref 8–23)
CO2: 26 mmol/L (ref 22–32)
Calcium: 10.2 mg/dL (ref 8.9–10.3)
Chloride: 99 mmol/L (ref 98–111)
Creatinine, Ser: 0.73 mg/dL (ref 0.44–1.00)
GFR calc Af Amer: >60 mL/min (ref >60)
GFR calc non Af Amer: >60 mL/min (ref >60)
Glucose, Bld: 148 mg/dL — ABNORMAL HIGH (ref 70–99)
Potassium: 3.7 mmol/L (ref 3.5–5.1)
Sodium: 135 mmol/L (ref 135–145)

## 2019-08-25 LAB — CBC
HCT: 36 % (ref 36.0–46.0)
Hemoglobin: 12.2 g/dL (ref 12.0–15.0)
MCH: 30 pg (ref 26.0–34.0)
MCHC: 33.9 g/dL (ref 30.0–36.0)
MCV: 88.7 fL (ref 80.0–100.0)
Platelets: 192 10*3/uL (ref 150–400)
RBC: 4.06 MIL/uL (ref 3.87–5.11)
RDW: 14.9 % (ref 11.5–15.5)
WBC: 11 10*3/uL — ABNORMAL HIGH (ref 4.0–10.5)
nRBC: 0 % (ref 0.0–0.2)

## 2019-08-25 LAB — GLUCOSE, CAPILLARY
Glucose-Capillary: 137 mg/dL — ABNORMAL HIGH (ref 70–99)
Glucose-Capillary: 166 mg/dL — ABNORMAL HIGH (ref 70–99)

## 2019-08-25 LAB — MAGNESIUM: Magnesium: 2 mg/dL (ref 1.7–2.4)

## 2019-08-25 LAB — PHOSPHORUS: Phosphorus: 2.7 mg/dL (ref 2.5–4.6)

## 2019-08-25 MED ORDER — CEPHALEXIN 500 MG PO CAPS: 500.0000 mg | ORAL_CAPSULE | Freq: Four times a day (QID) | ORAL | 0 refills | Status: DC

## 2019-08-25 MED ORDER — CEPHALEXIN 500 MG PO CAPS: 500.0000 mg | ORAL_CAPSULE | Freq: Four times a day (QID) | ORAL | 0 refills | Status: AC

## 2019-08-25 MED ORDER — GLIPIZIDE 5 MG PO TABS: 2.5000 mg | ORAL_TABLET | Freq: Two times a day (BID) | ORAL | 1 refills | Status: DC

## 2019-08-25 NOTE — Care Management Important Message (Signed)
Important Message  Patient Details  Name: Jenna Robinson MRN: [id number] Date of Birth: [birth year]   Medicare Important Message Given:  Other (see comment)  Left message with niece [name], waiting on call back. SCS [phone]   2:05     08/25/2019, 2:04 PM

## 2019-08-25 NOTE — Discharge Summary (Signed)
Physician Discharge Summary  Jenna PhilipsBarbara Vernell Robinson ZOX:[id number] DOB: 1925/02/17 DOA: 08/21/2019  PCP: Mickel FuchsWroth, Thomas H, MD  Admit date: 08/21/2019 Discharge date: 08/25/2019  Admitted From: ALF Disposition: ALF  Recommendations for Outpatient Follow-up:  1. Follow up with PCP in 1-2 weeks 2. Please obtain BMP/CBC in one week 3. Complete treatment for UTI 4. Encourage oral intake   Discharge Condition: Stable CODE STATUS: DNR Diet recommendation: Carb Modified   Brief/Interim Summary: 86 year old with past medical history significant for Alzheimer's dementia, diabetes type 2, hypothyroidism and hypertension brought to the ED due to altered mental status and poor oral intake.  In the ED, hemodynamically stable, lactic acid 9.1 subsequently decreased to 6.1.  Calcium 12.5, anion gap 17.  Influenza PCR negative.  UA concerning for UTI.  Chest x-ray and EKG without acute finding. Patient was admitted for acute metabolic encephalopathy possible UTI.  She was a started on IV ceftriaxone.  1-Acute metabolic encephalopathy: Patient with a prior history of Alzheimer's dementia. -Suspect related to dehydration, poor oral intake, infectious process. -Patient currently is alert and oriented to place and person. -Delirium precaution -Continue treatment of UTI - Patient is alert, conversant. Following command.   2-Dehydration: Secondary to poor oral intake treated with IV fluids. Ate most of her breakfast today.   3-Lactic acidosis: Likely from dehydration and Metformin versus  Infection.  Resolved with IV fluids  4-Hypercalcemia: Likely secondary to hypovolemia Treated with IV fluids.  Diabetes type 2: On Metformin. May need to discontinue Metformin at discharge given lactic acidosis Discharge on low dose glipizide.  She has required 10 units of SSI a day.   UTI: UA; 21-50 white blood cell Urine culture growing gram-negative rods, E coli. Sensitive to ceftriaxone.  Continue with  IV ceftriaxone 3 days. Discharge on keflex for 2 days.   Hypomagnesemia;  Replete IV.   Hypophosphatemia; replete IV   Hypokalemia; Resolved  Urinary incontinence: On oxybutynin.  Essential hypertension: Continue with lisinopril and atenolol    Discharge Diagnoses:  Principal Problem:   Sepsis secondary to UTI [facility](HCC) Active Problems:   Lactic acidosis   Acute metabolic encephalopathy   Inadequate oral intake   Dementia without behavioral disturbance (HCC)   Diabetes mellitus without complication (HCC)   Protein-calorie malnutrition, severe    Discharge Instructions  Discharge Instructions    Diet - low sodium heart healthy   Complete by: As directed    Increase activity slowly   Complete by: As directed      Allergies as of 08/25/2019      Reactions   Penicillins Itching      Medication List    STOP taking these medications   metFORMIN 500 MG (MOD) 24 hr tablet Commonly known as: GLUMETZA     TAKE these medications   acetaminophen 325 MG tablet Commonly known as: TYLENOL Take 650 mg by mouth every 6 (six) hours as needed for mild pain, moderate pain, fever or headache.   alendronate 70 MG tablet Commonly known as: FOSAMAX Take 1 tablet by mouth once a week. With 8 oz of water at least 30 minutes before food/meds. Do not lie down for 30 minutes after dose.   aspirin 81 MG chewable tablet Chew 81 mg by mouth daily.   atenolol 25 MG tablet Commonly known as: TENORMIN Take 25 mg by mouth daily.   atorvastatin 20 MG tablet Commonly known as: LIPITOR Take 1 tablet (20 mg total) by mouth daily.   cephALEXin 500 MG capsule Commonly known as:  KEFLEX Take 1 capsule (500 mg total) by mouth 4 (four) times daily for 2 days.   cholecalciferol 25 MCG (1000 UNIT) tablet Commonly known as: VITAMIN D Take 2,000 Units by mouth daily.   cinacalcet 30 MG tablet Commonly known as: SENSIPAR Take 30 mg by mouth every Monday, Wednesday, and Friday.    cyanocobalamin 1000 MCG tablet Take 1,000 mcg by mouth daily.   enoxaparin 40 MG/0.4ML injection Commonly known as: LOVENOX Inject 0.4 mLs (40 mg total) into the skin daily for 14 doses.   glipiZIDE 5 MG tablet Commonly known as: GLUCOTROL Take 0.5 tablets (2.5 mg total) by mouth 2 (two) times daily.   lisinopril 10 MG tablet Commonly known as: ZESTRIL Take 1 tablet by mouth daily.   Magnesium Oxide 500 MG Tabs Take 1 tablet by mouth daily.   Melatonin 10 MG Caps Take 1 capsule by mouth at bedtime.   mirtazapine 7.5 MG tablet Commonly known as: REMERON Take 7.5 mg by mouth at bedtime.   oxybutynin 5 MG tablet Commonly known as: DITROPAN Take 5 mg by mouth 2 (two) times daily.   traZODone 50 MG tablet Commonly known as: DESYREL Take 50 mg by mouth at bedtime.   Vitamin D (Ergocalciferol) 1.25 MG (50000 UNIT) Caps capsule Commonly known as: DRISDOL Take 50,000 Units by mouth every 7 (seven) days.   zinc oxide 20 % ointment Apply 1 application topically 2 (two) times daily. Apply topically to buttocks twice daily for dermatitis       Allergies  Allergen Reactions  . Penicillins Itching    Consultations: None  Procedures/Studies: DG Chest 2 View  Result Date: 08/21/2019 CLINICAL DATA:  Failure to thrive.  Not eating or drinking. EXAM: CHEST - 2 VIEW COMPARISON:  04/10/2019 FINDINGS: Heart size is normal. Chronic aortic atherosclerosis. No infiltrate, collapse or effusion. Ordinary spinal curvature. Loose body of the right shoulder. IMPRESSION: No active cardiopulmonary disease.  Aortic atherosclerosis. Electronically Signed   By: Paulina Fusi M.D.   On: 08/21/2019 19:25     Subjective: She is alert, no new complaints. Eating better  Discharge Exam: Vitals:   08/25/19 0744 08/25/19 1139  BP: 110/61 (!) 98/52  Pulse: 68 63  Resp: 17 17  Temp: 97.8 F (36.6 C) 97.8 F (36.6 C)  SpO2: 98% 97%     General: Pt is alert, awake, not in acute  distress Cardiovascular: RRR, S1/S2 +, no rubs, no gallops Respiratory: CTA bilaterally, no wheezing, no rhonchi Abdominal: Soft, NT, ND, bowel sounds + Extremities: no edema, no cyanosis    The results of significant diagnostics from this hospitalization (including imaging, microbiology, ancillary and laboratory) are listed below for reference.     Microbiology: Recent Results (from the past 240 hour(s))  Resp Panel by RT PCR (RSV, Flu A&B, Covid) - Nasopharyngeal Swab     Status: None   Collection Time: 08/21/19 10:27 PM   Specimen: Nasopharyngeal Swab  Result Value Ref Range Status   SARS Coronavirus 2 by RT PCR NEGATIVE NEGATIVE Final    Comment: (NOTE) SARS-CoV-2 target nucleic acids are NOT DETECTED.  The SARS-CoV-2 RNA is generally detectable in upper respiratoy specimens during the acute phase of infection. The lowest concentration of SARS-CoV-2 viral copies this assay can detect is 131 copies/mL. A negative result does not preclude SARS-Cov-2 infection and should not be used as the sole basis for treatment or other patient management decisions. A negative result may occur with  improper specimen collection/handling, submission of specimen  other than nasopharyngeal swab, presence of viral mutation(s) within the areas targeted by this assay, and inadequate number of viral copies (<131 copies/mL). A negative result must be combined with clinical observations, patient history, and epidemiological information. The expected result is Negative.  Fact Sheet for Patients:  https://www.moore.com/  Fact Sheet for Healthcare Providers:  https://www.young.biz/  This test is no t yet approved or cleared by the [place] FDA and  has been authorized for detection and/or diagnosis of SARS-CoV-2 by FDA under an Emergency Use Authorization (EUA). This EUA will remain  in effect (meaning this test can be used) for the duration of  the COVID-19 declaration under Section 564(b)(1) of the Act, 21 U.S.C. section 360bbb-3(b)(1), unless the authorization is terminated or revoked sooner.     Influenza A by PCR NEGATIVE NEGATIVE Final   Influenza B by PCR NEGATIVE NEGATIVE Final    Comment: (NOTE) The Xpert Xpress SARS-CoV-2/FLU/RSV assay is intended as an aid in  the diagnosis of influenza from Nasopharyngeal swab specimens and  should not be used as a sole basis for treatment. Nasal washings and  aspirates are unacceptable for Xpert Xpress SARS-CoV-2/FLU/RSV  testing.  Fact Sheet for Patients: https://www.moore.com/  Fact Sheet for Healthcare Providers: https://www.young.biz/  This test is not yet approved or cleared by the [place] FDA and  has been authorized for detection and/or diagnosis of SARS-CoV-2 by  FDA under an Emergency Use Authorization (EUA). This EUA will remain  in effect (meaning this test can be used) for the duration of the  Covid-19 declaration under Section 564(b)(1) of the Act, 21  U.S.C. section 360bbb-3(b)(1), unless the authorization is  terminated or revoked.    Respiratory Syncytial Virus by PCR NEGATIVE NEGATIVE Final    Comment: (NOTE) Fact Sheet for Patients: https://www.moore.com/  Fact Sheet for Healthcare Providers: https://www.young.biz/  This test is not yet approved or cleared by the [place] FDA and  has been authorized for detection and/or diagnosis of SARS-CoV-2 by  FDA under an Emergency Use Authorization (EUA). This EUA will remain  in effect (meaning this test can be used) for the duration of the  COVID-19 declaration under Section 564(b)(1) of the Act, 21 U.S.C.  section 360bbb-3(b)(1), unless the authorization is terminated or  revoked. Performed at [facility], [address], [place], [place] [address]   Urine culture     Status: Abnormal   Collection Time:  08/22/19 12:15 AM   Specimen: Urine, Random  Result Value Ref Range Status   Specimen Description   Final    URINE, RANDOM Performed at [facility], [address] [place], [place] [address]    Special Requests   Final    NONE Performed at [facility], [address] [place], [place] [address]    Culture >=100,000 COLONIES/mL ESCHERICHIA COLI (A)  Final   Report Status 08/24/2019 FINAL  Final   Organism ID, Bacteria ESCHERICHIA COLI (A)  Final      Susceptibility   Escherichia coli - MIC*    AMPICILLIN >=32 RESISTANT Resistant     CEFAZOLIN <=4 SENSITIVE Sensitive     CEFTRIAXONE <=0.25 SENSITIVE Sensitive     CIPROFLOXACIN >=4 RESISTANT Resistant     GENTAMICIN <=1 SENSITIVE Sensitive     IMIPENEM <=0.25 SENSITIVE Sensitive     NITROFURANTOIN <=16 SENSITIVE Sensitive     TRIMETH/SULFA >=320 RESISTANT Resistant     AMPICILLIN/SULBACTAM 16 INTERMEDIATE Intermediate     PIP/TAZO <=4 SENSITIVE Sensitive     * >=  100,000 COLONIES/mL ESCHERICHIA COLI  Blood culture (routine x 2)     Status: None (Preliminary result)   Collection Time: 08/22/19  2:06 AM   Specimen: BLOOD  Result Value Ref Range Status   Specimen Description BLOOD LAC  Final   Special Requests BOTTLES DRAWN AEROBIC AND ANAEROBIC BCAV  Final   Culture   Final    NO GROWTH 3 DAYS Performed at [facility], [address], [place], [place] [address]    Report Status PENDING  Incomplete  Blood culture (routine x 2)     Status: None (Preliminary result)   Collection Time: 08/22/19  2:06 AM   Specimen: BLOOD  Result Value Ref Range Status   Specimen Description BLOOD LFOA  Final   Special Requests BOTTLES DRAWN AEROBIC AND ANAEROBIC BCAV  Final   Culture   Final    NO GROWTH 3 DAYS Performed at [facility], [address], [place], [place] [address]    Report Status PENDING  Incomplete     Labs: BNP (last 3 results) No results for input(s): BNP in the last 8760  hours. Basic Metabolic Panel: Recent Labs  Lab 08/21/19 1837 08/22/19 0211 08/23/19 0523 08/24/19 0524 08/25/19 0603  NA 136 136 134* 139 135  K 4.5 3.8 2.8* 3.9 3.7  CL 91* 94* 95* 99 99  CO2 GLUCOSE 115* 110* 98 132* 148*  BUN 33* 28* CREATININE 0.94 0.85 0.58 0.69 0.73  CALCIUM 12.5* 11.9* 9.9 10.0 10.2  MG  --   --  1.3* 1.6* 2.0  PHOS  --   --  1.6* 2.4* 2.7   Liver Function Tests: Recent Labs  Lab 08/21/19 1837 08/23/19 0523  AST 36  --   ALT 23  --   ALKPHOS 53  --   BILITOT 0.9  --   PROT 7.5  --   ALBUMIN 4.4 4.0   No results for input(s): LIPASE, AMYLASE in the last 168 hours. No results for input(s): AMMONIA in the last 168 hours. CBC: Recent Labs  Lab 08/21/19 1837 08/22/19 0211 08/23/19 0523 08/25/19 0603  WBC 9.1 8.8 8.0 11.0*  NEUTROABS 6.2  --   --   --   HGB 13.6 13.6 12.7 12.2  HCT 41.7 41.4 36.7 36.0  MCV 91.2 90.8 86.8 88.7  PLT 236 221 180 192   Cardiac Enzymes: No results for input(s): CKTOTAL, CKMB, CKMBINDEX, TROPONINI in the last 168 hours. BNP: Invalid input(s): POCBNP CBG: Recent Labs  Lab 08/24/19 1250 08/24/19 1612 08/24/19 2010 08/25/19 0745 08/25/19 1121  GLUCAP 283* 253* 175* 137* 166*   D-Dimer No results for input(s): DDIMER in the last 72 hours. Hgb A1c No results for input(s): HGBA1C in the last 72 hours. Lipid Profile No results for input(s): CHOL, HDL, LDLCALC, TRIG, CHOLHDL, LDLDIRECT in the last 72 hours. Thyroid function studies No results for input(s): TSH, T4TOTAL, T3FREE, THYROIDAB in the last 72 hours.  Invalid input(s): FREET3 Anemia work up No results for input(s): VITAMINB12, FOLATE, FERRITIN, TIBC, IRON, RETICCTPCT in the last 72 hours. Urinalysis    Component Value Date/Time   COLORURINE YELLOW (A) 08/22/2019 0015   APPEARANCEUR HAZY (A) 08/22/2019 0015   APPEARANCEUR Clear 01/09/2013 1900   LABSPEC 1.015 08/22/2019 0015   LABSPEC 1.016 01/09/2013 1900    PHURINE 5.0 08/22/2019 0015   GLUCOSEU NEGATIVE 08/22/2019 0015   GLUCOSEU >=500 01/09/2013 1900   HGBUR SMALL (A)  08/22/2019 0015   BILIRUBINUR NEGATIVE 08/22/2019 0015   BILIRUBINUR Negative 01/09/2013 1900   KETONESUR 5 (A) 08/22/2019 0015   PROTEINUR NEGATIVE 08/22/2019 0015   NITRITE POSITIVE (A) 08/22/2019 0015   LEUKOCYTESUR SMALL (A) 08/22/2019 0015   LEUKOCYTESUR 1+ 01/09/2013 1900   Sepsis Labs Invalid input(s): PROCALCITONIN,  WBC,  LACTICIDVEN Microbiology Recent Results (from the past 240 hour(s))  Resp Panel by RT PCR (RSV, Flu A&B, Covid) - Nasopharyngeal Swab     Status: None   Collection Time: 08/21/19 10:27 PM   Specimen: Nasopharyngeal Swab  Result Value Ref Range Status   SARS Coronavirus 2 by RT PCR NEGATIVE NEGATIVE Final    Comment: (NOTE) SARS-CoV-2 target nucleic acids are NOT DETECTED.  The SARS-CoV-2 RNA is generally detectable in upper respiratoy specimens during the acute phase of infection. The lowest concentration of SARS-CoV-2 viral copies this assay can detect is 131 copies/mL. A negative result does not preclude SARS-Cov-2 infection and should not be used as the sole basis for treatment or other patient management decisions. A negative result may occur with  improper specimen collection/handling, submission of specimen other than nasopharyngeal swab, presence of viral mutation(s) within the areas targeted by this assay, and inadequate number of viral copies (<131 copies/mL). A negative result must be combined with clinical observations, patient history, and epidemiological information. The expected result is Negative.  Fact Sheet for Patients:  https://www.moore.com/  Fact Sheet for Healthcare Providers:  https://www.young.biz/  This test is no t yet approved or cleared by the [place] FDA and  has been authorized for detection and/or diagnosis of SARS-CoV-2 by FDA under an Emergency Use  Authorization (EUA). This EUA will remain  in effect (meaning this test can be used) for the duration of the COVID-19 declaration under Section 564(b)(1) of the Act, 21 U.S.C. section 360bbb-3(b)(1), unless the authorization is terminated or revoked sooner.     Influenza A by PCR NEGATIVE NEGATIVE Final   Influenza B by PCR NEGATIVE NEGATIVE Final    Comment: (NOTE) The Xpert Xpress SARS-CoV-2/FLU/RSV assay is intended as an aid in  the diagnosis of influenza from Nasopharyngeal swab specimens and  should not be used as a sole basis for treatment. Nasal washings and  aspirates are unacceptable for Xpert Xpress SARS-CoV-2/FLU/RSV  testing.  Fact Sheet for Patients: https://www.moore.com/  Fact Sheet for Healthcare Providers: https://www.young.biz/  This test is not yet approved or cleared by the [place] FDA and  has been authorized for detection and/or diagnosis of SARS-CoV-2 by  FDA under an Emergency Use Authorization (EUA). This EUA will remain  in effect (meaning this test can be used) for the duration of the  Covid-19 declaration under Section 564(b)(1) of the Act, 21  U.S.C. section 360bbb-3(b)(1), unless the authorization is  terminated or revoked.    Respiratory Syncytial Virus by PCR NEGATIVE NEGATIVE Final    Comment: (NOTE) Fact Sheet for Patients: https://www.moore.com/  Fact Sheet for Healthcare Providers: https://www.young.biz/  This test is not yet approved or cleared by the [place] FDA and  has been authorized for detection and/or diagnosis of SARS-CoV-2 by  FDA under an Emergency Use Authorization (EUA). This EUA will remain  in effect (meaning this test can be used) for the duration of the  COVID-19 declaration under Section 564(b)(1) of the Act, 21 U.S.C.  section 360bbb-3(b)(1), unless the authorization is terminated or  revoked. Performed at [facility],  [address], [place], [place] [address]   Urine culture  Status: Abnormal   Collection Time: 08/22/19 12:15 AM   Specimen: Urine, Random  Result Value Ref Range Status   Specimen Description   Final    URINE, RANDOM Performed at [facility], [address] [place], [place] [address]    Special Requests   Final    NONE Performed at [facility], [address] [place], [place] [address]    Culture >=100,000 COLONIES/mL ESCHERICHIA COLI (A)  Final   Report Status 08/24/2019 FINAL  Final   Organism ID, Bacteria ESCHERICHIA COLI (A)  Final      Susceptibility   Escherichia coli - MIC*    AMPICILLIN >=32 RESISTANT Resistant     CEFAZOLIN <=4 SENSITIVE Sensitive     CEFTRIAXONE <=0.25 SENSITIVE Sensitive     CIPROFLOXACIN >=4 RESISTANT Resistant     GENTAMICIN <=1 SENSITIVE Sensitive     IMIPENEM <=0.25 SENSITIVE Sensitive     NITROFURANTOIN <=16 SENSITIVE Sensitive     TRIMETH/SULFA >=320 RESISTANT Resistant     AMPICILLIN/SULBACTAM 16 INTERMEDIATE Intermediate     PIP/TAZO <=4 SENSITIVE Sensitive     * >=100,000 COLONIES/mL ESCHERICHIA COLI  Blood culture (routine x 2)     Status: None (Preliminary result)   Collection Time: 08/22/19  2:06 AM   Specimen: BLOOD  Result Value Ref Range Status   Specimen Description BLOOD LAC  Final   Special Requests BOTTLES DRAWN AEROBIC AND ANAEROBIC BCAV  Final   Culture   Final    NO GROWTH 3 DAYS Performed at [facility], [address] [place], [place] [address]    Report Status PENDING  Incomplete  Blood culture (routine x 2)     Status: None (Preliminary result)   Collection Time: 08/22/19  2:06 AM   Specimen: BLOOD  Result Value Ref Range Status   Specimen Description BLOOD LFOA  Final   Special Requests BOTTLES DRAWN AEROBIC AND ANAEROBIC BCAV  Final   Culture   Final    NO GROWTH 3 DAYS Performed at [facility], [address], [place], [place] [address]    Report Status  PENDING  Incomplete     Time coordinating discharge: 40 minutes  SIGNED:   Alba Cory, MD  [facility]

## 2019-08-27 LAB — CULTURE, BLOOD (ROUTINE X 2)
Culture: NO GROWTH
Culture: NO GROWTH

## 2019-09-12 ENCOUNTER — Emergency Department: Payer: Medicare Other

## 2019-09-12 ENCOUNTER — Inpatient Hospital Stay
Admission: EM | Admit: 2019-09-12 | Discharge: 2019-09-16 | DRG: 200 | Disposition: A | Discharge status: Skilled Nursing Facility | Payer: Medicare Other | Attending: Internal Medicine | Admitting: Internal Medicine

## 2019-09-12 ENCOUNTER — Inpatient Hospital Stay: Payer: Medicare Other

## 2019-09-12 ENCOUNTER — Other Ambulatory Visit: Payer: Self-pay

## 2019-09-12 DIAGNOSIS — Z7983 Long term (current) use of bisphosphonates: Secondary | ICD-10-CM

## 2019-09-12 DIAGNOSIS — J90 Pleural effusion, not elsewhere classified: Secondary | ICD-10-CM | POA: Diagnosis not present

## 2019-09-12 DIAGNOSIS — E876 Hypokalemia: Secondary | ICD-10-CM | POA: Diagnosis present

## 2019-09-12 DIAGNOSIS — Z66 Do not resuscitate: Secondary | ICD-10-CM

## 2019-09-12 DIAGNOSIS — E119 Type 2 diabetes mellitus without complications: Secondary | ICD-10-CM

## 2019-09-12 DIAGNOSIS — Z9689 Presence of other specified functional implants: Secondary | ICD-10-CM

## 2019-09-12 DIAGNOSIS — J449 Chronic obstructive pulmonary disease, unspecified: Secondary | ICD-10-CM | POA: Diagnosis present

## 2019-09-12 DIAGNOSIS — J189 Pneumonia, unspecified organism: Secondary | ICD-10-CM

## 2019-09-12 DIAGNOSIS — Z7189 Other specified counseling: Secondary | ICD-10-CM

## 2019-09-12 DIAGNOSIS — H9190 Unspecified hearing loss, unspecified ear: Secondary | ICD-10-CM | POA: Diagnosis present

## 2019-09-12 DIAGNOSIS — E7800 Pure hypercholesterolemia, unspecified: Secondary | ICD-10-CM | POA: Diagnosis present

## 2019-09-12 DIAGNOSIS — N390 Urinary tract infection, site not specified: Secondary | ICD-10-CM | POA: Diagnosis not present

## 2019-09-12 DIAGNOSIS — Z20822 Contact with and (suspected) exposure to covid-19: Secondary | ICD-10-CM | POA: Diagnosis present

## 2019-09-12 DIAGNOSIS — Z23 Encounter for immunization: Secondary | ICD-10-CM

## 2019-09-12 DIAGNOSIS — Z880 Allergy status to penicillin: Secondary | ICD-10-CM

## 2019-09-12 DIAGNOSIS — Y92099 Unspecified place in other non-institutional residence as the place of occurrence of the external cause: Secondary | ICD-10-CM | POA: Diagnosis not present

## 2019-09-12 DIAGNOSIS — S270XXD Traumatic pneumothorax, subsequent encounter: Secondary | ICD-10-CM | POA: Diagnosis not present

## 2019-09-12 DIAGNOSIS — I10 Essential (primary) hypertension: Secondary | ICD-10-CM | POA: Diagnosis present

## 2019-09-12 DIAGNOSIS — S270XXA Traumatic pneumothorax, initial encounter: Principal | ICD-10-CM | POA: Diagnosis present

## 2019-09-12 DIAGNOSIS — W010XXA Fall on same level from slipping, tripping and stumbling without subsequent striking against object, initial encounter: Secondary | ICD-10-CM | POA: Diagnosis present

## 2019-09-12 DIAGNOSIS — I700 Atherosclerosis of aorta: Secondary | ICD-10-CM | POA: Diagnosis present

## 2019-09-12 DIAGNOSIS — R636 Underweight: Secondary | ICD-10-CM | POA: Diagnosis present

## 2019-09-12 DIAGNOSIS — D72829 Elevated white blood cell count, unspecified: Secondary | ICD-10-CM | POA: Diagnosis present

## 2019-09-12 DIAGNOSIS — Z87440 Personal history of urinary (tract) infections: Secondary | ICD-10-CM

## 2019-09-12 DIAGNOSIS — S0003XA Contusion of scalp, initial encounter: Secondary | ICD-10-CM | POA: Diagnosis present

## 2019-09-12 DIAGNOSIS — Z681 Body mass index (BMI) 19 or less, adult: Secondary | ICD-10-CM | POA: Diagnosis not present

## 2019-09-12 DIAGNOSIS — Z7984 Long term (current) use of oral hypoglycemic drugs: Secondary | ICD-10-CM

## 2019-09-12 DIAGNOSIS — Z638 Other specified problems related to primary support group: Secondary | ICD-10-CM

## 2019-09-12 DIAGNOSIS — R296 Repeated falls: Secondary | ICD-10-CM | POA: Diagnosis present

## 2019-09-12 DIAGNOSIS — Z515 Encounter for palliative care: Secondary | ICD-10-CM | POA: Diagnosis not present

## 2019-09-12 DIAGNOSIS — W19XXXA Unspecified fall, initial encounter: Secondary | ICD-10-CM | POA: Diagnosis not present

## 2019-09-12 DIAGNOSIS — J939 Pneumothorax, unspecified: Secondary | ICD-10-CM

## 2019-09-12 DIAGNOSIS — T859XXA Unspecified complication of internal prosthetic device, implant and graft, initial encounter: Secondary | ICD-10-CM

## 2019-09-12 DIAGNOSIS — Z79899 Other long term (current) drug therapy: Secondary | ICD-10-CM

## 2019-09-12 DIAGNOSIS — G309 Alzheimer's disease, unspecified: Secondary | ICD-10-CM | POA: Diagnosis present

## 2019-09-12 DIAGNOSIS — J9589 Other postprocedural complications and disorders of respiratory system, not elsewhere classified: Secondary | ICD-10-CM

## 2019-09-12 DIAGNOSIS — E43 Unspecified severe protein-calorie malnutrition: Secondary | ICD-10-CM | POA: Diagnosis not present

## 2019-09-12 DIAGNOSIS — F0280 Dementia in other diseases classified elsewhere without behavioral disturbance: Secondary | ICD-10-CM | POA: Diagnosis present

## 2019-09-12 DIAGNOSIS — E11649 Type 2 diabetes mellitus with hypoglycemia without coma: Secondary | ICD-10-CM | POA: Diagnosis not present

## 2019-09-12 DIAGNOSIS — S2241XA Multiple fractures of ribs, right side, initial encounter for closed fracture: Secondary | ICD-10-CM | POA: Diagnosis present

## 2019-09-12 DIAGNOSIS — S61411A Laceration without foreign body of right hand, initial encounter: Secondary | ICD-10-CM | POA: Diagnosis present

## 2019-09-12 DIAGNOSIS — R945 Abnormal results of liver function studies: Secondary | ICD-10-CM | POA: Diagnosis not present

## 2019-09-12 LAB — URINALYSIS, COMPLETE (UACMP) WITH MICROSCOPIC
Bacteria, UA: NONE SEEN
Bilirubin Urine: NEGATIVE
Glucose, UA: 50 mg/dL — AB
Ketones, ur: 5 mg/dL — AB
Leukocytes,Ua: NEGATIVE
Nitrite: NEGATIVE
Protein, ur: 30 mg/dL — AB
Specific Gravity, Urine: 1.013 (ref 1.005–1.030)
pH: 7 (ref 5.0–8.0)

## 2019-09-12 LAB — BASIC METABOLIC PANEL
Anion gap: 12 (ref 5–15)
BUN: 30 mg/dL — ABNORMAL HIGH (ref 8–23)
CO2: 30 mmol/L (ref 22–32)
Calcium: 11.7 mg/dL — ABNORMAL HIGH (ref 8.9–10.3)
Chloride: 93 mmol/L — ABNORMAL LOW (ref 98–111)
Creatinine, Ser: 1.07 mg/dL — ABNORMAL HIGH (ref 0.44–1.00)
GFR calc Af Amer: 53 mL/min — ABNORMAL LOW (ref >60)
GFR calc non Af Amer: 45 mL/min — ABNORMAL LOW (ref >60)
Glucose, Bld: 175 mg/dL — ABNORMAL HIGH (ref 70–99)
Potassium: 3.4 mmol/L — ABNORMAL LOW (ref 3.5–5.1)
Sodium: 135 mmol/L (ref 135–145)

## 2019-09-12 LAB — SARS CORONAVIRUS 2 BY RT PCR (HOSPITAL ORDER, PERFORMED IN ~~LOC~~ HOSPITAL LAB): SARS Coronavirus 2: NEGATIVE

## 2019-09-12 LAB — CBC WITH DIFFERENTIAL/PLATELET
Abs Immature Granulocytes: 0.12 10*3/uL — ABNORMAL HIGH (ref 0.00–0.07)
Basophils Absolute: 0 10*3/uL (ref 0.0–0.1)
Basophils Relative: 0 %
Eosinophils Absolute: 0.1 10*3/uL (ref 0.0–0.5)
Eosinophils Relative: 1 %
HCT: 37.8 % (ref 36.0–46.0)
Hemoglobin: 12.3 g/dL (ref 12.0–15.0)
Immature Granulocytes: 1 %
Lymphocytes Relative: 5 %
Lymphs Abs: 1.1 10*3/uL (ref 0.7–4.0)
MCH: 29.9 pg (ref 26.0–34.0)
MCHC: 32.5 g/dL (ref 30.0–36.0)
MCV: 91.7 fL (ref 80.0–100.0)
Monocytes Absolute: 1.6 10*3/uL — ABNORMAL HIGH (ref 0.1–1.0)
Monocytes Relative: 7 %
Neutro Abs: 18.7 10*3/uL — ABNORMAL HIGH (ref 1.7–7.7)
Neutrophils Relative %: 86 %
Platelets: 296 10*3/uL (ref 150–400)
RBC: 4.12 MIL/uL (ref 3.87–5.11)
RDW: 15.2 % (ref 11.5–15.5)
WBC: 21.6 10*3/uL — ABNORMAL HIGH (ref 4.0–10.5)
nRBC: 0 % (ref 0.0–0.2)

## 2019-09-12 LAB — BRAIN NATRIURETIC PEPTIDE: B Natriuretic Peptide: 119.2 pg/mL — ABNORMAL HIGH (ref 0.0–100.0)

## 2019-09-12 MED ORDER — SENNA 8.6 MG PO TABS
1.0000 | ORAL_TABLET | Freq: Two times a day (BID) | ORAL | Status: DC
  Administered 2019-09-12 – 2019-09-15 (×5): 8.6 mg via ORAL
  Filled 2019-09-12 (×5): qty 1

## 2019-09-12 MED ORDER — ENOXAPARIN SODIUM 30 MG/0.3ML ~~LOC~~ SOLN: 30.0000 mg | SUBCUTANEOUS | Status: DC

## 2019-09-12 MED ORDER — HYDRALAZINE HCL 20 MG/ML IJ SOLN
5.0000 mg | Freq: Four times a day (QID) | INTRAMUSCULAR | Status: DC | PRN
  Administered 2019-09-12: 5 mg via INTRAVENOUS
  Filled 2019-09-12: qty 1

## 2019-09-12 MED ORDER — ACETAMINOPHEN 650 MG RE SUPP: 650.0000 mg | Freq: Four times a day (QID) | RECTAL | Status: DC | PRN

## 2019-09-12 MED ORDER — LIDOCAINE HCL (PF) 1 % IJ SOLN
INTRAMUSCULAR | Status: AC
  Filled 2019-09-12: qty 5

## 2019-09-12 MED ORDER — OXYBUTYNIN CHLORIDE 5 MG PO TABS
5.0000 mg | ORAL_TABLET | Freq: Two times a day (BID) | ORAL | Status: DC
  Administered 2019-09-13 – 2019-09-15 (×6): 5 mg via ORAL
  Filled 2019-09-12 (×8): qty 1

## 2019-09-12 MED ORDER — DOCUSATE SODIUM 100 MG PO CAPS
100.0000 mg | ORAL_CAPSULE | Freq: Two times a day (BID) | ORAL | Status: DC
  Administered 2019-09-12 – 2019-09-14 (×4): 100 mg via ORAL
  Filled 2019-09-12 (×5): qty 1

## 2019-09-12 MED ORDER — INSULIN ASPART 100 UNIT/ML ~~LOC~~ SOLN
0.0000 [IU] | Freq: Three times a day (TID) | SUBCUTANEOUS | Status: DC
  Administered 2019-09-13: 1 [IU] via SUBCUTANEOUS
  Administered 2019-09-14: 5 [IU] via SUBCUTANEOUS
  Administered 2019-09-15: 1 [IU] via SUBCUTANEOUS
  Administered 2019-09-15: 5 [IU] via SUBCUTANEOUS
  Filled 2019-09-12 (×4): qty 1

## 2019-09-12 MED ORDER — LIDOCAINE HCL (PF) 1 % IJ SOLN
5.0000 mL | Freq: Once | INTRAMUSCULAR | Status: AC
  Administered 2019-09-12: 5 mL

## 2019-09-12 MED ORDER — ONDANSETRON HCL 4 MG PO TABS: 4.0000 mg | ORAL_TABLET | Freq: Four times a day (QID) | ORAL | Status: DC | PRN

## 2019-09-12 MED ORDER — ONDANSETRON HCL 4 MG/2ML IJ SOLN: 4.0000 mg | Freq: Four times a day (QID) | INTRAMUSCULAR | Status: DC | PRN

## 2019-09-12 MED ORDER — ATENOLOL 25 MG PO TABS
25.0000 mg | ORAL_TABLET | Freq: Every day | ORAL | Status: DC
  Administered 2019-09-13 – 2019-09-15 (×3): 25 mg via ORAL
  Filled 2019-09-12 (×4): qty 1

## 2019-09-12 MED ORDER — ALUM & MAG HYDROXIDE-SIMETH 200-200-20 MG/5ML PO SUSP: 30.0000 mL | Freq: Four times a day (QID) | ORAL | Status: DC | PRN

## 2019-09-12 MED ORDER — LIDOCAINE-EPINEPHRINE 2 %-1:100000 IJ SOLN
20.0000 mL | Freq: Once | INTRAMUSCULAR | Status: AC
  Administered 2019-09-12: 20 mL
  Filled 2019-09-12: qty 1

## 2019-09-12 MED ORDER — ACETAMINOPHEN 325 MG PO TABS
650.0000 mg | ORAL_TABLET | Freq: Four times a day (QID) | ORAL | Status: DC | PRN
  Administered 2019-09-13 (×2): 650 mg via ORAL
  Filled 2019-09-12 (×2): qty 2

## 2019-09-12 MED ORDER — SODIUM CHLORIDE 0.9 % IV SOLN: INTRAVENOUS | Status: DC

## 2019-09-12 MED ORDER — TETANUS-DIPHTH-ACELL PERTUSSIS 5-2.5-18.5 LF-MCG/0.5 IM SUSP
0.5000 mL | Freq: Once | INTRAMUSCULAR | Status: AC
  Administered 2019-09-12: 0.5 mL via INTRAMUSCULAR
  Filled 2019-09-12: qty 0.5

## 2019-09-12 MED ORDER — POTASSIUM CHLORIDE 20 MEQ PO PACK
40.0000 meq | PACK | Freq: Once | ORAL | Status: AC
  Administered 2019-09-12: 40 meq via ORAL
  Filled 2019-09-12: qty 2

## 2019-09-12 NOTE — Progress Notes (Signed)
Cross Cover Note Code status changed to DNR as indicated in current H & P and confirmed in prior hospital admission with medical HCPOA Ms. Luetta Nutting per progress notes.

## 2019-09-12 NOTE — ED Provider Notes (Addendum)
[facility] Emergency Department Provider Note  ____________________________________________  Time seen: Approximately 4:29 PM  I have reviewed the triage vital signs and the nursing notes.   HISTORY  Chief Complaint Fall    Level 5 Caveat: Portions of the History and Physical including HPI and review of systems are unable to be completely obtained due to patient dementia  HPI Jenna Robinson is a 86 y.o. female with a history of Alzheimer's disease, diabetes, hypertension who comes the ED due to a fall at her assisted living facility.  Patient denies any complaints, unable to provide much of a coherent history.      Past Medical History:  Diagnosis Date  . Alzheimer disease (HCC)   . Diabetes mellitus without complication (HCC)   . Hypercholesteremia   . Hypertension   . Hyperthyroidism      Patient Active Problem List   Diagnosis Date Noted  . Pneumothorax, closed, traumatic 09/12/2019  . Protein-calorie malnutrition, severe 08/23/2019  . Lactic acidosis 08/22/2019  . Sepsis secondary to UTI (HCC) 08/22/2019  . Acute metabolic encephalopathy 08/22/2019  . Inadequate oral intake 08/22/2019  . Dementia without behavioral disturbance (HCC) 08/22/2019  . Diabetes mellitus without complication (HCC) 08/22/2019  . Closed right hip fracture (HCC) 04/08/2019  . Hypercalcemia 05/22/2015  . Elevated troponin 01/02/2015  . Rhabdomyolysis 01/02/2015     Past Surgical History:  Procedure Laterality Date  . ABDOMINAL HYSTERECTOMY    . DILATION AND CURETTAGE, DIAGNOSTIC / THERAPEUTIC    . INTRAMEDULLARY (IM) NAIL INTERTROCHANTERIC Right 04/08/2019   Procedure: INTRAMEDULLARY (IM) NAIL INTERTROCHANTRIC;  Surgeon: Signa Kell, MD;  Location: [facility] ORS;  Service: Orthopedics;  Laterality: Right;     Prior to Admission medications   Medication Sig Start Date End Date Taking? Authorizing Provider  alendronate (FOSAMAX) 70 MG tablet Take 1 tablet  by mouth once a week. With 8 oz of water at least 30 minutes before food/meds. Do not lie down for 30 minutes after dose. 11/11/16  Yes [provider]  alum & mag hydroxide-simeth (MYLANTA) 200-200-20 MG/5ML suspension Take 30 mLs by mouth every 6 (six) hours as needed for indigestion or heartburn.   Yes [provider]  bismuth subsalicylate (PEPTO-BISMOL) 262 MG/15ML suspension Take 15 mLs by mouth every 6 (six) hours as needed.   Yes [provider]  dextromethorphan-guaiFENesin (ROBITUSSIN COLD COUGH+ CHEST) 10-100 MG/5ML liquid Take 10 mLs by mouth every 4 (four) hours as needed for cough.   Yes [provider]  feeding supplement (BOOST HIGH PROTEIN) LIQD Take 1 Container by mouth 2 (two) times daily between meals.   Yes [provider]  morphine 10 MG/5ML solution Take 10 mg by mouth every 2 (two) hours as needed for moderate pain or severe pain.   Yes [provider]  oxybutynin (DITROPAN) 5 MG tablet Take 5 mg by mouth 2 (two) times daily.   Yes [provider]  acetaminophen (TYLENOL) 325 MG tablet Take 650 mg by mouth every 6 (six) hours as needed for mild pain, moderate pain, fever or headache.    [provider]  atenolol (TENORMIN) 25 MG tablet Take 25 mg by mouth daily.     [provider]  atorvastatin (LIPITOR) 20 MG tablet Take 1 tablet (20 mg total) by mouth daily. Patient not taking: Reported on 09/12/2019 01/05/15   Jenna Baas, MD  enoxaparin (LOVENOX) 40 MG/0.4ML injection Inject 0.4 mLs (40 mg total) into the skin daily for 14 doses. Patient  not taking: Reported on 09/12/2019 04/10/19 09/12/19  Dedra SkeensMundy, Todd, PA-C  glipiZIDE (GLUCOTROL) 5 MG tablet Take 0.5 tablets (2.5 mg total) by mouth 2 (two) times daily. Patient not taking: Reported on 09/12/2019 08/25/19 12/23/19  Hartley Barefootegalado, Belkys A, MD  zinc oxide 20 % ointment Apply 1 application topically 2 (two) times daily. Apply topically to buttocks  twice daily for dermatitis    [provider]     Allergies Penicillins   Family History  Problem Relation Age of Onset  . Cancer Other        multiple family members including all siblings    Social History Social History   Tobacco Use  . Smoking status: Never Smoker  . Smokeless tobacco: Never Used  Substance Use Topics  . Alcohol use: No  . Drug use: Not on file    Review of Systems Level 5 Caveat: Portions of the History and Physical including HPI and review of systems are unable to be completely obtained due to patient being a poor historian   Constitutional:   No known fever.  ENT:   No rhinorrhea. Cardiovascular:   No chest pain or syncope. Respiratory:   No dyspnea or cough. Gastrointestinal:   Negative for abdominal pain, vomiting and diarrhea.  Musculoskeletal:   Negative for focal pain or swelling ____________________________________________   PHYSICAL EXAM:  VITAL SIGNS: ED Triage Vitals  Enc Vitals Group     BP 09/12/19 1350 126/78     Pulse Rate 09/12/19 1350 (!) 56     Resp 09/12/19 1350 18     Temp 09/12/19 1350 98 F (36.7 C)     Temp Source 09/12/19 1350 Oral     SpO2 09/12/19 1350 98 %     Weight 09/12/19 1351 116 lb 13.5 oz (53 kg)     Height 09/12/19 1351 5\' 9"  (1.753 m)     Head Circumference --      Peak Flow --      Pain Score --      Pain Loc --      Pain Edu? --      Excl. in GC? --     Vital signs reviewed, nursing assessments reviewed.   Constitutional:   Alert and oriented to self. Non-toxic appearance. Eyes:   Conjunctivae are normal. EOMI. PERRL. ENT      Head:   Normocephalic and atraumatic.      Nose:   No congestion/rhinnorhea.       Mouth/Throat:   MMM, no pharyngeal erythema. No peritonsillar mass.       Neck:   No meningismus. Full ROM.  No midline spinal tenderness Hematological/Lymphatic/Immunilogical:   No cervical lymphadenopathy. Cardiovascular:   RRR. Symmetric bilateral radial and DP pulses.  No  murmurs. Cap refill less than 2 seconds. Respiratory:   Normal respiratory effort without tachypnea/retractions. Breath sounds are clear and equal bilaterally. No wheezes/rales/rhonchi. Gastrointestinal:   Soft and nontender. Non distended. There is no CVA tenderness.  No rebound, rigidity, or guarding.  Musculoskeletal:   Normal range of motion in all extremities. No joint effusions.  No lower extremity tenderness.  No edema. Neurologic:   Normal speech, incoherent/nonlinear language Motor grossly intact. No acute focal neurologic deficits are appreciated.  Skin:    Skin is warm, dry with superficial skin tear over the right elbow and a skin tear with flap into the dermis over the right hand overlying the third MCP and between the second and third metacarpals.  ____________________________________________  LABS (pertinent positives/negatives) (all labs ordered are listed, but only abnormal results are displayed) Labs Reviewed  BASIC METABOLIC PANEL - Abnormal; Notable for the following components:      Result Value   Potassium 3.4 (*)    Chloride 93 (*)    Glucose, Bld 175 (*)    BUN 30 (*)    Creatinine, Ser 1.07 (*)    Calcium 11.7 (*)    GFR calc non Af Amer 45 (*)    GFR calc Af Amer 53 (*)    All other components within normal limits  CBC WITH DIFFERENTIAL/PLATELET - Abnormal; Notable for the following components:   WBC 21.6 (*)    Neutro Abs 18.7 (*)    Monocytes Absolute 1.6 (*)    Abs Immature Granulocytes 0.12 (*)    All other components within normal limits  URINALYSIS, COMPLETE (UACMP) WITH MICROSCOPIC - Abnormal; Notable for the following components:   Color, Urine YELLOW (*)    APPearance HAZY (*)    Glucose, UA 50 (*)    Hgb urine dipstick SMALL (*)    Ketones, ur 5 (*)    Protein, ur 30 (*)    All other components within normal limits  BRAIN NATRIURETIC PEPTIDE - Abnormal; Notable for the following components:   B Natriuretic Peptide 119.2 (*)    All  other components within normal limits  SARS CORONAVIRUS 2 BY RT PCR (HOSPITAL ORDER, PERFORMED IN [facility])  URINE CULTURE  COMPREHENSIVE METABOLIC PANEL  CBC  PROTIME-INR  APTT   ____________________________________________   EKG    ____________________________________________    RADIOLOGY  DG Chest 1 View  Result Date: 09/12/2019 CLINICAL DATA:  Status post fall with pneumothorax seen on neck CT. EXAM: CHEST  1 VIEW COMPARISON:  August 21, 2019 FINDINGS: The lungs are hyperinflated. There is no evidence of acute infiltrate or pleural effusion. A moderate size right-sided pneumothorax is seen along the right apex and lateral aspect of the mid and upper right lung. The heart size and mediastinal contours are within normal limits. There is marked severity calcification of the aortic arch. The visualized skeletal structures are unremarkable. IMPRESSION: 1. Moderate size right-sided pneumothorax. 2. Findings consistent with COPD. 3. Aortic atherosclerosis. 4. No acute infiltrate. Electronically Signed   By: Aram Candela M.D.   On: 09/12/2019 16:35   DG Elbow 2 Views Right  Result Date: 09/12/2019 CLINICAL DATA:  Fall with trauma to the elbow. EXAM: RIGHT ELBOW - 2 VIEW COMPARISON:  None. FINDINGS: There is no evidence of fracture, dislocation, or joint effusion. There is no evidence of arthropathy or other focal bone abnormality. Soft tissues are unremarkable. IMPRESSION: Negative. Electronically Signed   By: Paulina Fusi M.D.   On: 09/12/2019 15:05   CT Head Wo Contrast  Addendum Date: 09/12/2019   ADDENDUM REPORT: 09/12/2019 15:24 ADDENDUM: These results were called by telephone at the time of interpretation on 09/12/2019 at 3:23 pm to provider Dorothea Glassman , who verbally acknowledged these results. Electronically Signed   By: Helyn Numbers MD   On: 09/12/2019 15:24   Result Date: 09/12/2019 CLINICAL DATA:  Multiple falls, head injury, scalp hematoma EXAM: CT HEAD  WITHOUT CONTRAST CT CERVICAL SPINE WITHOUT CONTRAST TECHNIQUE: Multidetector CT imaging of the head and cervical spine was performed following the standard protocol without intravenous contrast. Multiplanar CT image reconstructions of the cervical spine were also generated. COMPARISON:  None. FINDINGS: CT HEAD FINDINGS Brain: Moderate parenchymal volume loss is commensurate  with the patient's age. Moderate periventricular and subcortical white matter changes are present likely reflecting the sequela of small vessel ischemia. Remote lacunar infarct noted within the left basal ganglia no evidence of acute intracranial hemorrhage or infarct. No abnormal mass effect or midline shift. No abnormal intra or extra-axial mass lesion. Ventricular size is normal and commensurate with the degree of volume loss. Cerebellum is unremarkable. Vascular: No asymmetric hyperdense vasculature at the skull base. Skull: Intact Sinuses/Orbits: Next small air-fluid level noted within the left sphenoid sinus. Small mucous retention cyst noted within the right sphenoid sinus. Remaining paranasal sinuses are unremarkable. Orbits are unremarkable. Other: Mastoid air cells and middle ear cavities are clear. Small right temporal scalp hematoma is present with associated punctate subcutaneous gas in keeping with history of local trauma. CT CERVICAL SPINE FINDINGS Alignment: There is reversal of the normal cervical lordosis without evidence of focal angulation or listhesis. Skull base and vertebrae: The craniocervical junction is unremarkable. Atlantodental interval is normal. There is no acute fracture of the cervical spine. No lytic or blastic bone lesions are seen. Soft tissues and spinal canal: No canal hematoma identified. The paraspinal soft tissues are unremarkable. Disc levels: Review of the sagittal reformats demonstrates severe intervertebral disc space narrowing and endplate remodeling at C4-5, C5-6, and C6-7 in keeping with changes of  defense degenerative disc disease. Milder degenerative changes are noted at C3-4. Advanced degenerative changes are seen at the C1-2 articulation involving the left lateral mass and atlantodental articulation. Vertebral body height has been preserved. Review of the axial images demonstrates multilevel uncovertebral and facet arthrosis resulting in multilevel neural foraminal narrowing, most severe on the left at C4-5, bilaterally at C5-6, bilaterally at C6-7 and, to a milder extent, on the left at C3-4 and on the right at C4-5. There is moderate central canal stenosis at C4-5, C5-6, and C6-7 secondary to posterior disc osteophyte complexes which result in flattening of the thecal sac at these levels. Upper chest: Tiny right apical pneumothorax is present Other: None significant IMPRESSION: 1. Small right temporal scalp hematoma with associated punctate subcutaneous gas in keeping with history of local trauma. 2. No acute intracranial abnormality. 3. No acute fracture or malalignment of the cervical spine. 4. Advanced degenerative disc disease and facet arthrosis throughout the cervical spine, most severe at C4-5, C5-6, and C6-7 where there is moderate central canal stenosis and multilevel neural foraminal narrowing. 5. Tiny right apical pneumothorax. Electronically Signed: By: Helyn Numbers MD On: 09/12/2019 15:14   CT Cervical Spine Wo Contrast  Addendum Date: 09/12/2019   ADDENDUM REPORT: 09/12/2019 15:24 ADDENDUM: These results were called by telephone at the time of interpretation on 09/12/2019 at 3:23 pm to provider Dorothea Glassman , who verbally acknowledged these results. Electronically Signed   By: Helyn Numbers MD   On: 09/12/2019 15:24   Result Date: 09/12/2019 CLINICAL DATA:  Multiple falls, head injury, scalp hematoma EXAM: CT HEAD WITHOUT CONTRAST CT CERVICAL SPINE WITHOUT CONTRAST TECHNIQUE: Multidetector CT imaging of the head and cervical spine was performed following the standard protocol  without intravenous contrast. Multiplanar CT image reconstructions of the cervical spine were also generated. COMPARISON:  None. FINDINGS: CT HEAD FINDINGS Brain: Moderate parenchymal volume loss is commensurate with the patient's age. Moderate periventricular and subcortical white matter changes are present likely reflecting the sequela of small vessel ischemia. Remote lacunar infarct noted within the left basal ganglia no evidence of acute intracranial hemorrhage or infarct. No abnormal mass effect or midline shift. No  abnormal intra or extra-axial mass lesion. Ventricular size is normal and commensurate with the degree of volume loss. Cerebellum is unremarkable. Vascular: No asymmetric hyperdense vasculature at the skull base. Skull: Intact Sinuses/Orbits: Next small air-fluid level noted within the left sphenoid sinus. Small mucous retention cyst noted within the right sphenoid sinus. Remaining paranasal sinuses are unremarkable. Orbits are unremarkable. Other: Mastoid air cells and middle ear cavities are clear. Small right temporal scalp hematoma is present with associated punctate subcutaneous gas in keeping with history of local trauma. CT CERVICAL SPINE FINDINGS Alignment: There is reversal of the normal cervical lordosis without evidence of focal angulation or listhesis. Skull base and vertebrae: The craniocervical junction is unremarkable. Atlantodental interval is normal. There is no acute fracture of the cervical spine. No lytic or blastic bone lesions are seen. Soft tissues and spinal canal: No canal hematoma identified. The paraspinal soft tissues are unremarkable. Disc levels: Review of the sagittal reformats demonstrates severe intervertebral disc space narrowing and endplate remodeling at C4-5, C5-6, and C6-7 in keeping with changes of defense degenerative disc disease. Milder degenerative changes are noted at C3-4. Advanced degenerative changes are seen at the C1-2 articulation involving the left  lateral mass and atlantodental articulation. Vertebral body height has been preserved. Review of the axial images demonstrates multilevel uncovertebral and facet arthrosis resulting in multilevel neural foraminal narrowing, most severe on the left at C4-5, bilaterally at C5-6, bilaterally at C6-7 and, to a milder extent, on the left at C3-4 and on the right at C4-5. There is moderate central canal stenosis at C4-5, C5-6, and C6-7 secondary to posterior disc osteophyte complexes which result in flattening of the thecal sac at these levels. Upper chest: Tiny right apical pneumothorax is present Other: None significant IMPRESSION: 1. Small right temporal scalp hematoma with associated punctate subcutaneous gas in keeping with history of local trauma. 2. No acute intracranial abnormality. 3. No acute fracture or malalignment of the cervical spine. 4. Advanced degenerative disc disease and facet arthrosis throughout the cervical spine, most severe at C4-5, C5-6, and C6-7 where there is moderate central canal stenosis and multilevel neural foraminal narrowing. 5. Tiny right apical pneumothorax. Electronically Signed: By: Helyn Numbers MD On: 09/12/2019 15:14   DG Chest Portable 1 View  Result Date: 09/12/2019 CLINICAL DATA:  Follow-up pneumothorax EXAM: PORTABLE CHEST 1 VIEW COMPARISON:  Earlier same day FINDINGS: Right chest tube is in place. Marked reduction in right pneumothorax. Only a tiny amount of persistent pleural air. Fracture of the right lateral third, fourth and fifth ribs. Left chest remains clear. Normal heart size. Aortic atherosclerosis as seen previously. IMPRESSION: Marked reduction in right pneumothorax with only a tiny amount of persistent pleural air. Right lateral third, fourth and fifth rib fractures. Electronically Signed   By: Paulina Fusi M.D.   On: 09/12/2019 19:18    ____________________________________________   PROCEDURES .Marland KitchenLaceration Repair  Date/Time: 09/12/2019 4:34  PM Performed by: Sharman Cheek, MD Authorized by: Sharman Cheek, MD   Consent:    Consent obtained:  Verbal   Consent given by:  Patient   Risks discussed:  Infection, poor cosmetic result, pain and poor wound healing   Alternatives discussed:  No treatment Anesthesia (see MAR for exact dosages):    Anesthesia method:  Local infiltration   Local anesthetic:  Lidocaine 1% w/o epi Laceration details:    Location:  Hand   Hand location:  R hand, dorsum   Length (cm):  3 Repair type:    Repair  type:  Simple Pre-procedure details:    Preparation:  Patient was prepped and draped in usual sterile fashion Exploration:    Hemostasis achieved with:  Direct pressure   Wound exploration: wound explored through full range of motion and entire depth of wound probed and visualized     Wound extent: no fascia violation noted, no foreign bodies/material noted, no muscle damage noted, no nerve damage noted, no tendon damage noted, no underlying fracture noted and no vascular damage noted     Contaminated: no   Treatment:    Area cleansed with:  Betadine   Amount of cleaning:  Standard   Irrigation solution:  Sterile saline   Visualized foreign bodies/material removed: no   Skin repair:    Repair method:  Sutures   Suture size:  4-0   Wound skin closure material used: Monocryl.   Suture technique:  Running   Number of sutures:  6 Approximation:    Approximation:  Close Post-procedure details:    Dressing:  Non-adherent dressing and sterile dressing   Patient tolerance of procedure:  Tolerated well, no immediate complications Comments:        CHEST TUBE INSERTION  Date/Time: 09/12/2019 7:02 PM Performed by: Sharman Cheek, MD Authorized by: Sharman Cheek, MD   Consent:    Consent obtained:  Emergent situation   Risks discussed:  Nerve damage, pain, infection, damage to surrounding structures, bleeding and incomplete drainage   Alternatives discussed:   Observation Universal protocol:    Relevant documents present and verified: yes     Test results available and properly labeled: yes     Imaging studies available: yes     Required blood products, implants, devices, and special equipment available: yes     Site/side marked: yes     Immediately prior to procedure a time out was called: yes     Patient identity confirmed:  Arm band Pre-procedure details:    Skin preparation:  ChloraPrep   Preparation: Patient was prepped and draped in the usual sterile fashion   Anesthesia (see MAR for exact dosages):    Anesthesia method:  Local infiltration   Local anesthetic:  Lidocaine 2% WITH epi Procedure details:    Placement location:  R lateral   Scalpel size:  11   Tube size ([place]): 9.   Dissection instrument: introducer needle.   Ultrasound guidance: no     Tension pneumothorax: no     Tube connected to:  Suction   Drainage characteristics:  Air only   Suture material:  0 silk   Dressing:  Xeroform gauze and 4x4 sterile gauze Post-procedure details:    Patient tolerance of procedure:  Tolerated well, no immediate complications    ____________________________________________  DIFFERENTIAL DIAGNOSIS   Intracranial hemorrhage, C-spine fracture, elbow fracture, skull fracture  CLINICAL IMPRESSION / ASSESSMENT AND PLAN / ED COURSE  Medications ordered in the ED: Medications  lidocaine (PF) (XYLOCAINE) 1 % injection (has no administration in time range)  enoxaparin (LOVENOX) injection 40 mg (has no administration in time range)  0.9 %  sodium chloride infusion (has no administration in time range)  acetaminophen (TYLENOL) tablet 650 mg (has no administration in time range)    Or  acetaminophen (TYLENOL) suppository 650 mg (has no administration in time range)  docusate sodium (COLACE) capsule 100 mg (has no administration in time range)  senna (SENOKOT) tablet 8.6 mg (has no administration in time range)  ondansetron (ZOFRAN)  tablet 4 mg (has no administration in time range)  Or  ondansetron (ZOFRAN) injection 4 mg (has no administration in time range)  hydrALAZINE (APRESOLINE) injection 5 mg (5 mg Intravenous Given 09/12/19 2013)  insulin aspart (novoLOG) injection 0-9 Units (has no administration in time range)  potassium chloride (KLOR-CON) packet 40 mEq (has no administration in time range)  atenolol (TENORMIN) tablet 25 mg (has no administration in time range)  alum & mag hydroxide-simeth (MAALOX/MYLANTA) 200-200-20 MG/5ML suspension 30 mL (has no administration in time range)  oxybutynin (DITROPAN) tablet 5 mg (has no administration in time range)  lidocaine (PF) (XYLOCAINE) 1 % injection 5 mL (5 mLs Infiltration Given 09/12/19 1648)  Tdap (BOOSTRIX) injection 0.5 mL (0.5 mLs Intramuscular Given 09/12/19 1823)  lidocaine-EPINEPHrine (XYLOCAINE W/EPI) 2 %-1:100000 (with pres) injection 20 mL (20 mLs Infiltration Given 09/12/19 1826)    Pertinent labs & imaging results that were available during my care of the patient were reviewed by me and considered in my medical decision making (see chart for details).   Suzanna Zahn was evaluated in Emergency Department on 09/12/2019 for the symptoms described in the history of present illness. She was evaluated in the context of the global COVID-19 pandemic, which necessitated consideration that the patient might be at risk for infection with the SARS-CoV-2 virus that causes COVID-19. Institutional protocols and algorithms that pertain to the evaluation of patients at risk for COVID-19 are in a state of rapid change based on information released by regulatory bodies including the CDC and federal and state organizations. These policies and algorithms were followed during the patient's care in the ED.   Patient with dementia presents after a fall at her assisted living facility.  She has a scalp hematoma without laceration, otherwise reassuring cranial exam and C-spine  exam.  There is some tenderness about the right elbow where the skin tear is, so get an x-ray.  No evidence of hand fracture, but the laceration that needs repair.  Will update tetanus, obtain CT head neck.  Clinical Course as of Sep 12 2031  Tue Sep 12, 2019  1522 CT results d/w radiology, noting tiny apical ptx, otherwise reassuring CT head/neck.    [PS]  1628 Chest x-ray interpreted by me, shows a moderate sized right-sided pneumothorax without displaced rib fractures.  Will need to hospitalize for oxygen monitoring.   [PS]  1748 D/w surgery Dr. Claudine Mouton. Agrees with tele monitoring, recs inserting chest tube now. Will contact niece - HCPOA.   [PS]    Clinical Course User Index [PS] Sharman Cheek, MD     ----------------------------------------- 6:59 PM on 09/12/2019 ----------------------------------------- No call back from family member identified as [facility] POA in the chart.  To avoid delaying care, I placed a small, 9 French straight chest tube under implied consent doctrine.will repeat cxr. Pt tolerated well, good BS b/l, nl so2, no apparent pain during procedure.  ----------------------------------------- 8:32 PM on 09/12/2019 ----------------------------------------- Repeat cxr shows chest tube in good position with near-resolution of right side ptx.  CT is connected to pleurevac with LIS.     ____________________________________________   FINAL CLINICAL IMPRESSION(S) / ED DIAGNOSES    Final diagnoses:  Fall, initial encounter  Pneumothorax, unspecified type  Laceration of right hand without foreign body, initial encounter  Scalp hematoma, initial encounter  Type 2 diabetes mellitus without complication, without long-term current use of insulin (HCC)  Alzheimer's dementia without behavioral disturbance, unspecified timing of dementia onset [facility])     ED Discharge Orders    None      Portions  of this note were generated with dragon dictation software.  Dictation errors may occur despite best attempts at proofreading.   Sharman Cheek, MD 09/12/19 1750    Sharman Cheek, MD 09/12/19 2033    Sharman Cheek, MD 09/24/19 2314

## 2019-09-12 NOTE — ED Notes (Signed)
Patient placed in hospital bed

## 2019-09-12 NOTE — ED Triage Notes (Addendum)
Via [facility] from [facility] for evaluation after fall. Hematoma and dried blood to right head, skin tears to right elbow. This RN called staff who reported that pt fell while getting up from bed, trip and fall,  and hit her dresser, denies LOC. Pt with hx of dementia.

## 2019-09-12 NOTE — ED Notes (Signed)
X-ray at bedside

## 2019-09-12 NOTE — Progress Notes (Signed)
PHARMACIST - PHYSICIAN COMMUNICATION  CONCERNING:  Enoxaparin (Lovenox) for DVT Prophylaxis    RECOMMENDATION: Patient was prescribed enoxaprin 40mg  q24 hours for VTE prophylaxis.   Filed Weights   09/12/19 1351  Weight: 53 kg (116 lb 13.5 oz)    Body mass index is 17.25 kg/m.  Estimated Creatinine Clearance: 28.7 mL/min (A) (by C-G formula based on SCr of 1.07 mg/dL (H)).   Patient is candidate for enoxaparin 30mg  every 24 hours based on CrCl <64ml/min or Weight <45kg  DESCRIPTION: Pharmacy has adjusted enoxaparin dose per [facility] policy.  Patient is now receiving enoxaparin 30 mg every 24 hours    31m, PharmD Clinical Pharmacist  09/12/2019 9:50 PM

## 2019-09-12 NOTE — H&P (Signed)
History and Physical    Jenna Robinson KXF:[id number] DOB: [birth year] DOA: 09/12/2019  PCP: Mickel Fuchs, MD  Patient coming from:  [facility]  I have personally briefly reviewed patient's old medical records in [facility] Link  Chief Complaint:  S/p Fall with scalp hematoma/ Pneumothorax   HPI: Jenna Robinson is a 86 y.o. female with medical history significant of Alzheimer's dementia, hard of hearing, diabetes mellitus, hypertension, hypothyroidism presented in the emergency department from [facility] status post fall.  History is obtained from ED chart.  Per assistant living staff Patient has been falling a lot lately.  Patient fell today while getting out of the bed,  she tripped and fall and hit the dresser but she denies any loss of consciousness.  It is difficult to get more details from the patient so EMS was called.  ED Course: She was hypertensive.  Temp 97.5, HR 56 RR 14 blood pressure 150 / 64,  O2 saturation 95%. Labs: CBC WBC 21.6, hemoglobin 12.3, hematocrit 37.5, MCV 91, platelet 296, sodium 135 potassium 3.4, chloride 93, bicarb 30, BUN 30, creatinine 1.07, glucose 175, calcium 11.7, Covid test negative, UA pending. Chest x-ray : Moderate size right-sided pneumothorax. Findings consistent with COPD, no acute infiltrate. X-ray right elbow no fracture. CT head :Small right temporal scalp hematoma with associated punctate subcutaneous gas in keeping with history of local trauma. 2. No acute intracranial abnormality. 3. No acute fracture or malalignment of the cervical spine.   Review of Systems: As per HPI otherwise 10 point review of systems negative.  Review of Systems  Constitutional: Negative.   HENT:       Hard of hearing   Eyes: Negative.   Respiratory:       Pneumothroax, no shortness of breath  Cardiovascular: Negative.   Gastrointestinal: Negative.   Genitourinary: Negative.   Musculoskeletal: Negative.     Skin:       Skin breaks on both arms.  Neurological: Negative.   Endo/Heme/Allergies: Negative.   Psychiatric/Behavioral: Positive for memory loss.   All others reviewed and are negative,  Past Medical History:  Diagnosis Date  . Alzheimer disease (HCC)   . Diabetes mellitus without complication (HCC)   . Hypercholesteremia   . Hypertension   . Hyperthyroidism     Past Surgical History:  Procedure Laterality Date  . ABDOMINAL HYSTERECTOMY    . DILATION AND CURETTAGE, DIAGNOSTIC / THERAPEUTIC    . INTRAMEDULLARY (IM) NAIL INTERTROCHANTERIC Right 04/08/2019   Procedure: INTRAMEDULLARY (IM) NAIL INTERTROCHANTRIC;  Surgeon: Signa Kell, MD;  Location: [facility] ORS;  Service: Orthopedics;  Laterality: Right;     reports that she has never smoked. She has never used smokeless tobacco. She reports that she does not drink alcohol. No history on file for drug use.  Allergies  Allergen Reactions  . Penicillins Itching    Did it involve swelling of the face/tongue/throat, SOB, or low BP? No Did it involve sudden or severe rash/hives, skin peeling, or any reaction on the inside of your mouth or nose? Yes Did you need to seek medical attention at a hospital or doctor's office? No When did it last happen? If all above answers are "NO", may proceed with cephalosporin use.    Family History  Problem Relation Age of Onset  . Cancer Other        multiple family members including all siblings    Family history reviewed and not pertinent.  Prior to Admission medications  Medication Sig Start Date End Date Taking? Authorizing Provider  alendronate (FOSAMAX) 70 MG tablet Take 1 tablet by mouth once a week. With 8 oz of water at least 30 minutes before food/meds. Do not lie down for 30 minutes after dose. 11/11/16  Yes [provider]  alum & mag hydroxide-simeth (MYLANTA) 200-200-20 MG/5ML suspension Take 30 mLs by mouth every 6 (six) hours as needed for indigestion or  heartburn.   Yes [provider]  bismuth subsalicylate (PEPTO-BISMOL) 262 MG/15ML suspension Take 15 mLs by mouth every 6 (six) hours as needed.   Yes [provider]  dextromethorphan-guaiFENesin (ROBITUSSIN COLD COUGH+ CHEST) 10-100 MG/5ML liquid Take 10 mLs by mouth every 4 (four) hours as needed for cough.   Yes [provider]  feeding supplement (BOOST HIGH PROTEIN) LIQD Take 1 Container by mouth 2 (two) times daily between meals.   Yes [provider]  morphine 10 MG/5ML solution Take 10 mg by mouth every 2 (two) hours as needed for moderate pain or severe pain.   Yes [provider]  oxybutynin (DITROPAN) 5 MG tablet Take 5 mg by mouth 2 (two) times daily.   Yes [provider]  acetaminophen (TYLENOL) 325 MG tablet Take 650 mg by mouth every 6 (six) hours as needed for mild pain, moderate pain, fever or headache.    [provider]  atenolol (TENORMIN) 25 MG tablet Take 25 mg by mouth daily.     [provider]  atorvastatin (LIPITOR) 20 MG tablet Take 1 tablet (20 mg total) by mouth daily. Patient not taking: Reported on 09/12/2019 01/05/15   [name], MD  enoxaparin (LOVENOX) 40 MG/0.4ML injection Inject 0.4 mLs (40 mg total) into the skin daily for 14 doses. Patient not taking: Reported on 09/12/2019 04/10/19 09/12/19  [name], PA-C  glipiZIDE (GLUCOTROL) 5 MG tablet Take 0.5 tablets (2.5 mg total) by mouth 2 (two) times daily. Patient not taking: Reported on 09/12/2019 08/25/19 12/23/19  [name], MD  zinc oxide 20 % ointment Apply 1 application topically 2 (two) times daily. Apply topically to buttocks twice daily for dermatitis    [provider]    Physical Exam: Vitals:   09/12/19 1350 09/12/19 1351 09/12/19 1426  BP: 126/78  (!) 150/64  Pulse: (!) 56  65  Resp: 18  14  Temp: 98 F (36.7 C)  (!) 97.5 F (36.4 C)  TempSrc: Oral  Axillary  SpO2: 98%  95%  Weight:  53 kg     Height:   (1.753 m)     Constitutional: NAD, calm, comfortable Vitals:   09/12/19 1350 09/12/19 1351 09/12/19 1426  BP: 126/78  (!) 150/64  Pulse: (!) 56  65  Resp: 18  14  Temp: 98 F (36.7 C)  (!) 97.5 F (36.4 C)  TempSrc: Oral  Axillary  SpO2: 98%  95%  Weight:  53 kg   Height:   (1.753 m)    Eyes: PERRL, lids and conjunctivae normal ENMT: Mucous membranes are moist. Posterior pharynx clear of any exudate or lesions.Normal dentition.  Neck: normal, supple, no masses, no thyromegaly Respiratory: clear to auscultation bilaterally, no wheezing, no crackles. Normal respiratory effort. No accessory muscle use.  Cardiovascular: Regular rate and rhythm, no murmurs / rubs / gallops. No extremity edema. 2+ pedal pulses. No carotid bruits.  Abdomen: no tenderness, no masses palpated. No hepatosplenomegaly. Bowel sounds positive.  Musculoskeletal: no clubbing / cyanosis. No joint deformity upper and  lower extremities. Good ROM, no contractures. Normal muscle tone.  Skin: Bruises noted on both arms, right arm.  Scalp hematoma noted. Neurologic: CN 2-12 grossly intact. Sensation intact, DTR normal.  Psychiatric: Normal judgment and insight. Alert and oriented x 3. Normal mood.     Labs on Admission: I have personally reviewed following labs and imaging studies  CBC: Recent Labs  Lab 09/12/19 1647  WBC 21.6*  NEUTROABS 18.7*  HGB 12.3  HCT 37.8  MCV 91.7  PLT 296   Basic Metabolic Panel: Recent Labs  Lab 09/12/19 1647  NA 135  K 3.4*  CL 93*  CO2 30  GLUCOSE 175*  BUN 30*  CREATININE 1.07*  CALCIUM 11.7*   GFR: Estimated Creatinine Clearance: 28.7 mL/min (A) (by C-G formula based on SCr of 1.07 mg/dL (H)). Liver Function Tests: No results for input(s): AST, ALT, ALKPHOS, BILITOT, PROT, ALBUMIN in the last 168 hours. No results for input(s): LIPASE, AMYLASE in the last 168 hours. No results for input(s): AMMONIA in the last 168 hours. Coagulation  Profile: No results for input(s): INR, PROTIME in the last 168 hours. Cardiac Enzymes: No results for input(s): CKTOTAL, CKMB, CKMBINDEX, TROPONINI in the last 168 hours. BNP (last 3 results) No results for input(s): PROBNP in the last 8760 hours. HbA1C: No results for input(s): HGBA1C in the last 72 hours. CBG: No results for input(s): GLUCAP in the last 168 hours. Lipid Profile: No results for input(s): CHOL, HDL, LDLCALC, TRIG, CHOLHDL, LDLDIRECT in the last 72 hours. Thyroid Function Tests: No results for input(s): TSH, T4TOTAL, FREET4, T3FREE, THYROIDAB in the last 72 hours. Anemia Panel: No results for input(s): VITAMINB12, FOLATE, FERRITIN, TIBC, IRON, RETICCTPCT in the last 72 hours. Urine analysis:    Component Value Date/Time   COLORURINE YELLOW (A) 08/22/2019 0015   APPEARANCEUR HAZY (A) 08/22/2019 0015   APPEARANCEUR Clear 01/09/2013 1900   LABSPEC 1.015 08/22/2019 0015   LABSPEC 1.016 01/09/2013 1900   PHURINE 5.0 08/22/2019 0015   GLUCOSEU NEGATIVE 08/22/2019 0015   GLUCOSEU >=500 01/09/2013 1900   HGBUR SMALL (A) 08/22/2019 0015   BILIRUBINUR NEGATIVE 08/22/2019 0015   BILIRUBINUR Negative 01/09/2013 1900   KETONESUR 5 (A) 08/22/2019 0015   PROTEINUR NEGATIVE 08/22/2019 0015   NITRITE POSITIVE (A) 08/22/2019 0015   LEUKOCYTESUR SMALL (A) 08/22/2019 0015   LEUKOCYTESUR 1+ 01/09/2013 1900    Radiological Exams on Admission: DG Chest 1 View  Result Date: 09/12/2019 CLINICAL DATA:  Status post fall with pneumothorax seen on neck CT. EXAM: CHEST  1 VIEW COMPARISON:  August 21, 2019 FINDINGS: The lungs are hyperinflated. There is no evidence of acute infiltrate or pleural effusion. A moderate size right-sided pneumothorax is seen along the right apex and lateral aspect of the mid and upper right lung. The heart size and mediastinal contours are within normal limits. There is marked severity calcification of the aortic arch. The visualized skeletal structures are  unremarkable. IMPRESSION: 1. Moderate size right-sided pneumothorax. 2. Findings consistent with COPD. 3. Aortic atherosclerosis. 4. No acute infiltrate. Electronically Signed   By: Aram Candelahaddeus  Houston M.D.   On: 09/12/2019 16:35   DG Elbow 2 Views Right  Result Date: 09/12/2019 CLINICAL DATA:  Fall with trauma to the elbow. EXAM: RIGHT ELBOW - 2 VIEW COMPARISON:  None. FINDINGS: There is no evidence of fracture, dislocation, or joint effusion. There is no evidence of arthropathy or other focal bone abnormality. Soft tissues are unremarkable. IMPRESSION: Negative. Electronically Signed   By: Loraine LericheMark  Shogry M.D.   On: 09/12/2019 15:05   CT Head Wo Contrast  Addendum Date: 09/12/2019   ADDENDUM REPORT: 09/12/2019 15:24 ADDENDUM: These results were called by telephone at the time of interpretation on 09/12/2019 at 3:23 pm to provider Dorothea Glassman , who verbally acknowledged these results. Electronically Signed   By: Helyn Numbers MD   On: 09/12/2019 15:24   Result Date: 09/12/2019 CLINICAL DATA:  Multiple falls, head injury, scalp hematoma EXAM: CT HEAD WITHOUT CONTRAST CT CERVICAL SPINE WITHOUT CONTRAST TECHNIQUE: Multidetector CT imaging of the head and cervical spine was performed following the standard protocol without intravenous contrast. Multiplanar CT image reconstructions of the cervical spine were also generated. COMPARISON:  None. FINDINGS: CT HEAD FINDINGS Brain: Moderate parenchymal volume loss is commensurate with the patient's age. Moderate periventricular and subcortical white matter changes are present likely reflecting the sequela of small vessel ischemia. Remote lacunar infarct noted within the left basal ganglia no evidence of acute intracranial hemorrhage or infarct. No abnormal mass effect or midline shift. No abnormal intra or extra-axial mass lesion. Ventricular size is normal and commensurate with the degree of volume loss. Cerebellum is unremarkable. Vascular: No asymmetric hyperdense  vasculature at the skull base. Skull: Intact Sinuses/Orbits: Next small air-fluid level noted within the left sphenoid sinus. Small mucous retention cyst noted within the right sphenoid sinus. Remaining paranasal sinuses are unremarkable. Orbits are unremarkable. Other: Mastoid air cells and middle ear cavities are clear. Small right temporal scalp hematoma is present with associated punctate subcutaneous gas in keeping with history of local trauma. CT CERVICAL SPINE FINDINGS Alignment: There is reversal of the normal cervical lordosis without evidence of focal angulation or listhesis. Skull base and vertebrae: The craniocervical junction is unremarkable. Atlantodental interval is normal. There is no acute fracture of the cervical spine. No lytic or blastic bone lesions are seen. Soft tissues and spinal canal: No canal hematoma identified. The paraspinal soft tissues are unremarkable. Disc levels: Review of the sagittal reformats demonstrates severe intervertebral disc space narrowing and endplate remodeling at C4-5, C5-6, and C6-7 in keeping with changes of defense degenerative disc disease. Milder degenerative changes are noted at C3-4. Advanced degenerative changes are seen at the C1-2 articulation involving the left lateral mass and atlantodental articulation. Vertebral body height has been preserved. Review of the axial images demonstrates multilevel uncovertebral and facet arthrosis resulting in multilevel neural foraminal narrowing, most severe on the left at C4-5, bilaterally at C5-6, bilaterally at C6-7 and, to a milder extent, on the left at C3-4 and on the right at C4-5. There is moderate central canal stenosis at C4-5, C5-6, and C6-7 secondary to posterior disc osteophyte complexes which result in flattening of the thecal sac at these levels. Upper chest: Tiny right apical pneumothorax is present Other: None significant IMPRESSION: 1. Small right temporal scalp hematoma with associated punctate  subcutaneous gas in keeping with history of local trauma. 2. No acute intracranial abnormality. 3. No acute fracture or malalignment of the cervical spine. 4. Advanced degenerative disc disease and facet arthrosis throughout the cervical spine, most severe at C4-5, C5-6, and C6-7 where there is moderate central canal stenosis and multilevel neural foraminal narrowing. 5. Tiny right apical pneumothorax. Electronically Signed: By: Helyn Numbers MD On: 09/12/2019 15:14   CT Cervical Spine Wo Contrast  Addendum Date: 09/12/2019   ADDENDUM REPORT: 09/12/2019 15:24 ADDENDUM: These results were called by telephone at the time of interpretation on 09/12/2019 at 3:23 pm to provider Dorothea Glassman , who  verbally acknowledged these results. Electronically Signed   By: Helyn Numbers MD   On: 09/12/2019 15:24   Result Date: 09/12/2019 CLINICAL DATA:  Multiple falls, head injury, scalp hematoma EXAM: CT HEAD WITHOUT CONTRAST CT CERVICAL SPINE WITHOUT CONTRAST TECHNIQUE: Multidetector CT imaging of the head and cervical spine was performed following the standard protocol without intravenous contrast. Multiplanar CT image reconstructions of the cervical spine were also generated. COMPARISON:  None. FINDINGS: CT HEAD FINDINGS Brain: Moderate parenchymal volume loss is commensurate with the patient's age. Moderate periventricular and subcortical white matter changes are present likely reflecting the sequela of small vessel ischemia. Remote lacunar infarct noted within the left basal ganglia no evidence of acute intracranial hemorrhage or infarct. No abnormal mass effect or midline shift. No abnormal intra or extra-axial mass lesion. Ventricular size is normal and commensurate with the degree of volume loss. Cerebellum is unremarkable. Vascular: No asymmetric hyperdense vasculature at the skull base. Skull: Intact Sinuses/Orbits: Next small air-fluid level noted within the left sphenoid sinus. Small mucous retention cyst noted  within the right sphenoid sinus. Remaining paranasal sinuses are unremarkable. Orbits are unremarkable. Other: Mastoid air cells and middle ear cavities are clear. Small right temporal scalp hematoma is present with associated punctate subcutaneous gas in keeping with history of local trauma. CT CERVICAL SPINE FINDINGS Alignment: There is reversal of the normal cervical lordosis without evidence of focal angulation or listhesis. Skull base and vertebrae: The craniocervical junction is unremarkable. Atlantodental interval is normal. There is no acute fracture of the cervical spine. No lytic or blastic bone lesions are seen. Soft tissues and spinal canal: No canal hematoma identified. The paraspinal soft tissues are unremarkable. Disc levels: Review of the sagittal reformats demonstrates severe intervertebral disc space narrowing and endplate remodeling at C4-5, C5-6, and C6-7 in keeping with changes of defense degenerative disc disease. Milder degenerative changes are noted at C3-4. Advanced degenerative changes are seen at the C1-2 articulation involving the left lateral mass and atlantodental articulation. Vertebral body height has been preserved. Review of the axial images demonstrates multilevel uncovertebral and facet arthrosis resulting in multilevel neural foraminal narrowing, most severe on the left at C4-5, bilaterally at C5-6, bilaterally at C6-7 and, to a milder extent, on the left at C3-4 and on the right at C4-5. There is moderate central canal stenosis at C4-5, C5-6, and C6-7 secondary to posterior disc osteophyte complexes which result in flattening of the thecal sac at these levels. Upper chest: Tiny right apical pneumothorax is present Other: None significant IMPRESSION: 1. Small right temporal scalp hematoma with associated punctate subcutaneous gas in keeping with history of local trauma. 2. No acute intracranial abnormality. 3. No acute fracture or malalignment of the cervical spine. 4. Advanced  degenerative disc disease and facet arthrosis throughout the cervical spine, most severe at C4-5, C5-6, and C6-7 where there is moderate central canal stenosis and multilevel neural foraminal narrowing. 5. Tiny right apical pneumothorax. Electronically Signed: By: Helyn Numbers MD On: 09/12/2019 15:14    EKG: Ordered   Assessment/Plan Active Problems:   Pneumothorax, closed, traumatic    Moderate size pneumothorax s/p fall. Found to have moderate-sized pneumothorax on chest x-ray. Patient is not hypoxic,  saturating 94% on room air. General surgery consult appreciated, Patient is scheduled to get the pigtail today. Admit to progressive unit, Follow-up O2 saturation to maintain saturation above 94%. Repeat chest x-ray after pigtail. Adequate Pain control   Scalp hematoma s/p fall:  Small right temporal scalp hematoma with  associated subcutaneous gas consistent with local trauma. No acute intracranial abnormality on CT scan. Patient is at baseline mental status. Continue neurochecks every 4 hours. Repeat CT scan if there is any change in mental status.  Hypercalcemia: Found to have serum calcium of 11.7. Previous calcium normal, could be secondary to trauma. Continue gentle hydration Recheck calcium tomorrow.  Leukocytosis: This could be reactive to the trauma. Chest x-ray shows no infiltrate Urine pending. Obtain procalcitonin.  Diabetes Mellitus : Hold p.o. diabetic medications. Obtain hemoglobin A1c Regular insulin sliding scale.   Hypertension: Resume home medications  Alzheimer's dementia: Patient is at baseline.   DVT prophylaxis:  SCDs Code Status: DNR (previous hospitalization CODE STATUS was DNR) Family Communication: Left message on niece number who is the power of attorney. Disposition Plan: Anticipated discharge to nursing home. Consults called: General surgery Admission status: Inpatient   Cipriano Bunker MD [facility]   If 7PM-7AM,  please contact night-coverage [email or web address]   09/12/2019, 6:25 PM

## 2019-09-12 NOTE — ED Triage Notes (Signed)
Pt in via EMS from [facility] with c/o fall in the hall. Facility reports pt with multiple falls recently. Pt with dementia, FSBS 160, 140/51, HR 56, 99% on RA. Pt with skin tear to right arm and hematoma to right side of head.

## 2019-09-12 NOTE — ED Notes (Signed)
This RN contacted legal guardian, Payton Spark on file;no answer, left message

## 2019-09-12 NOTE — ED Notes (Signed)
Patient brought to [address]  Hall in wheelchair.  She stood and transferred to stretcher with one assist.  She is aler, no t orient4ed and cannot thell me what happened.  She got on stretcher on side and went to sleep.  She has bandages on left elbow and hand.

## 2019-09-13 ENCOUNTER — Inpatient Hospital Stay: Payer: Medicare Other

## 2019-09-13 ENCOUNTER — Encounter: Payer: Self-pay | Admitting: Family Medicine

## 2019-09-13 DIAGNOSIS — S270XXD Traumatic pneumothorax, subsequent encounter: Secondary | ICD-10-CM

## 2019-09-13 DIAGNOSIS — J939 Pneumothorax, unspecified: Secondary | ICD-10-CM

## 2019-09-13 LAB — URINE CULTURE

## 2019-09-13 LAB — CBC
HCT: 34.5 % — ABNORMAL LOW (ref 36.0–46.0)
Hemoglobin: 11.6 g/dL — ABNORMAL LOW (ref 12.0–15.0)
MCH: 30.1 pg (ref 26.0–34.0)
MCHC: 33.6 g/dL (ref 30.0–36.0)
MCV: 89.6 fL (ref 80.0–100.0)
Platelets: 257 10*3/uL (ref 150–400)
RBC: 3.85 MIL/uL — ABNORMAL LOW (ref 3.87–5.11)
RDW: 15.4 % (ref 11.5–15.5)
WBC: 14.3 10*3/uL — ABNORMAL HIGH (ref 4.0–10.5)
nRBC: 0 % (ref 0.0–0.2)

## 2019-09-13 LAB — COMPREHENSIVE METABOLIC PANEL
ALT: 38 U/L (ref 0–44)
AST: 47 U/L — ABNORMAL HIGH (ref 15–41)
Albumin: 3.9 g/dL (ref 3.5–5.0)
Alkaline Phosphatase: 82 U/L (ref 38–126)
Anion gap: 10 (ref 5–15)
BUN: 26 mg/dL — ABNORMAL HIGH (ref 8–23)
CO2: 28 mmol/L (ref 22–32)
Calcium: 11.1 mg/dL — ABNORMAL HIGH (ref 8.9–10.3)
Chloride: 98 mmol/L (ref 98–111)
Creatinine, Ser: 0.88 mg/dL (ref 0.44–1.00)
GFR calc Af Amer: >60 mL/min (ref >60)
GFR calc non Af Amer: 57 mL/min — ABNORMAL LOW (ref >60)
Glucose, Bld: 161 mg/dL — ABNORMAL HIGH (ref 70–99)
Potassium: 3.5 mmol/L (ref 3.5–5.1)
Sodium: 136 mmol/L (ref 135–145)
Total Bilirubin: 1.1 mg/dL (ref 0.3–1.2)
Total Protein: 7.1 g/dL (ref 6.5–8.1)

## 2019-09-13 LAB — APTT: aPTT: 40 seconds — ABNORMAL HIGH (ref 24–36)

## 2019-09-13 LAB — GLUCOSE, CAPILLARY
Glucose-Capillary: 136 mg/dL — ABNORMAL HIGH (ref 70–99)
Glucose-Capillary: 95 mg/dL (ref 70–99)
Glucose-Capillary: 115 mg/dL — ABNORMAL HIGH (ref 70–99)
Glucose-Capillary: 77 mg/dL (ref 70–99)

## 2019-09-13 LAB — PROTIME-INR
INR: 1 (ref 0.8–1.2)
Prothrombin Time: 12.8 seconds (ref 11.4–15.2)

## 2019-09-13 LAB — MAGNESIUM: Magnesium: 1.9 mg/dL (ref 1.7–2.4)

## 2019-09-13 MED ORDER — POTASSIUM CHLORIDE 20 MEQ PO PACK
40.0000 meq | PACK | Freq: Once | ORAL | Status: AC
  Administered 2019-09-13: 40 meq via ORAL
  Filled 2019-09-13: qty 2

## 2019-09-13 MED ORDER — ENOXAPARIN SODIUM 40 MG/0.4ML ~~LOC~~ SOLN
40.0000 mg | SUBCUTANEOUS | Status: DC
  Administered 2019-09-13: 40 mg via SUBCUTANEOUS
  Filled 2019-09-13: qty 0.4

## 2019-09-13 NOTE — Progress Notes (Signed)
PHARMACIST - PHYSICIAN COMMUNICATION  CONCERNING:  Enoxaparin (Lovenox) for DVT Prophylaxis   RECOMMENDATION: Patient was receiving enoxaprin 30mg  q24 hours for VTE prophylaxis.   Filed Weights   09/12/19 1351 09/13/19 0425  Weight: 53 kg (116 lb 13.5 oz) 50.8 kg (112 lb)    Body mass index is 16.54 kg/m.  Estimated Creatinine Clearance: 33.4 mL/min (by C-G formula based on SCr of 0.88 mg/dL).   Patient is candidate for enoxaparin 40mg  every 24 hours based on CrCl > 20ml/min  DESCRIPTION: 8/31 - Lovenox adjusted to 30 mg every 24 hours due to CrCl < 83ml/min 9/1 - Lovenox adjusted to 40 mg every 24 hours due to CrCl > 90ml/min + stable CBC  Pharmacy has adjusted enoxaparin dose per [facility] policy.  Patient is now receiving enoxaparin 40 mg every 24 hours   31m, PharmD Pharmacy Resident  09/13/2019 8:48 AM

## 2019-09-13 NOTE — Progress Notes (Addendum)
Progress Note    Jenna Robinson  RJJ:[id number] DOB: 06-08-1925  DOA: 09/12/2019 PCP: Mickel Fuchs, MD      Brief Narrative:    Medical records reviewed and are as summarized below:  Jenna Robinson is a 86 y.o. female       Assessment/Plan:   Active Problems:   Pneumothorax, closed, traumatic  Closed traumatic right apical pneumothorax s/p fall Small, right scalp hematoma Moderate right pleural effusion Type II DM Hypercalcemia Hypokalemia Leukocytosis, improving COPD, compensated Hypertension Alzheimer's dementia  PLAN  S/p chest tube placement by ED physician on 09/12/2019 Follow-up with surgeon for management of chest tube Analgesics as needed for pain Leukocytosis is likely reactive.  Monitor CBC Continue antihypertensives Hypokalemia is improving but continue to replete potassium and monitor levels Hypercalcemia is improving but continue to monitor levels. Humalog as needed for hyperglycemia Use SCDs for DVT prophylaxis and discontinue Lovenox because of bleeding at chest tube site and scalp hematoma. Continue supportive care Consulted palliative care to establish goals of care.  Her legal guardian, [name], is interested in hospice. She said patient had not been eating well and she had told her recently that she was tired and that she was ready.  She said she has been working with community hospice group that has been evaluating the patient at home.   Body mass index is 16.54 kg/m.  (Underweight)  Diet Order            DIET SOFT Room service appropriate? Yes; Fluid consistency: Thin  Diet effective now                     Medications:   . atenolol  25 mg Oral Daily  . docusate sodium  100 mg Oral BID  . insulin aspart  0-9 Units Subcutaneous TID WC  . oxybutynin  5 mg Oral BID  . senna  1 tablet Oral BID   Continuous Infusions:   Anti-infectives (From admission, onward)   None             Family  Communication/Anticipated D/C date and plan/Code Status   DVT prophylaxis: Place and maintain sequential compression device Start: 09/13/19 1209 Place and maintain sequential compression device Start: 09/13/19 1205     Code Status: DNR  Family Communication: Plan discussed with [name], legal guardian, Disposition Plan:    Status is: Inpatient  Remains inpatient appropriate because:Inpatient level of care appropriate due to severity of illness and She still has a chest tube in place   Dispo: The patient is from: ALF              Anticipated d/c is to: ALF              Anticipated d/c date is: 3 days              Patient currently is not medically stable to d/c.           Subjective:   Nurse reported patient had some bleeding at the site of the chest tube.  No shortness of breath or chest pain.  Objective:    Vitals:   09/13/19 0425 09/13/19 0432 09/13/19 0721 09/13/19 1149  BP: (!) 135/119 (!) 159/86 129/85 (!) 119/95  Pulse: 74 74 65 (!) 59  Resp: (!) 22  17 16   Temp: 97.8 F (36.6 C)  98.4 F (36.9 C) 97.6 F (36.4 C)  TempSrc: Oral  Oral Oral  SpO2: 98%  97% 100%  Weight: 50.8 kg     Height:       No data found.   Intake/Output Summary (Last 24 hours) at 09/13/2019 1333 Last data filed at 09/13/2019 0630 Gross per 24 hour  Intake 9 ml  Output 140 ml  Net -131 ml   Filed Weights   09/12/19 1351 09/13/19 0425  Weight: 53 kg 50.8 kg    Exam:  GEN: NAD SKIN: No rash EYES: EOMI ENT: MMM CV: RRR PULM: Chest tube on the right chest wall.  Dressing at chest tube site is stained with blood.  No active bleeding at this time. ABD: soft, ND, NT, +BS CNS: AAO x person and place, non focal EXT: No edema or tenderness   Data Reviewed:   I have personally reviewed following labs and imaging studies:  Labs: Labs show the following:   Basic Metabolic Panel: Recent Labs  Lab 09/12/19 1647 09/13/19 0409  NA 135 136  K 3.4* 3.5  CL 93* 98  CO2 30  28  GLUCOSE 175* 161*  BUN 30* 26*  CREATININE 1.07* 0.88  CALCIUM 11.7* 11.1*   GFR Estimated Creatinine Clearance: 33.4 mL/min (by C-G formula based on SCr of 0.88 mg/dL). Liver Function Tests: Recent Labs  Lab 09/13/19 0409  AST 47*  ALT 38  ALKPHOS 82  BILITOT 1.1  PROT 7.1  ALBUMIN 3.9   No results for input(s): LIPASE, AMYLASE in the last 168 hours. No results for input(s): AMMONIA in the last 168 hours. Coagulation profile Recent Labs  Lab 09/13/19 0409  INR 1.0    CBC: Recent Labs  Lab 09/12/19 1647 09/13/19 0409  WBC 21.6* 14.3*  NEUTROABS 18.7*  --   HGB 12.3 11.6*  HCT 37.8 34.5*  MCV 91.7 89.6  PLT 296 257   Cardiac Enzymes: No results for input(s): CKTOTAL, CKMB, CKMBINDEX, TROPONINI in the last 168 hours. BNP (last 3 results) No results for input(s): PROBNP in the last 8760 hours. CBG: Recent Labs  Lab 09/13/19 0723 09/13/19 1154  GLUCAP 136* 95   D-Dimer: No results for input(s): DDIMER in the last 72 hours. Hgb A1c: No results for input(s): HGBA1C in the last 72 hours. Lipid Profile: No results for input(s): CHOL, HDL, LDLCALC, TRIG, CHOLHDL, LDLDIRECT in the last 72 hours. Thyroid function studies: No results for input(s): TSH, T4TOTAL, T3FREE, THYROIDAB in the last 72 hours.  Invalid input(s): FREET3 Anemia work up: No results for input(s): VITAMINB12, FOLATE, FERRITIN, TIBC, IRON, RETICCTPCT in the last 72 hours. Sepsis Labs: Recent Labs  Lab 09/12/19 1647 09/13/19 0409  WBC 21.6* 14.3*    Microbiology Recent Results (from the past 240 hour(s))  SARS Coronavirus 2 by RT PCR (hospital order, performed in [facility] hospital lab) Nasopharyngeal Nasopharyngeal Swab     Status: None   Collection Time: 09/12/19  4:47 PM   Specimen: Nasopharyngeal Swab  Result Value Ref Range Status   SARS Coronavirus 2 NEGATIVE NEGATIVE Final    Comment: (NOTE) SARS-CoV-2 target nucleic acids are NOT DETECTED.  The SARS-CoV-2 RNA is  generally detectable in upper and lower respiratory specimens during the acute phase of infection. The lowest concentration of SARS-CoV-2 viral copies this assay can detect is 250 copies / mL. A negative result does not preclude SARS-CoV-2 infection and should not be used as the sole basis for treatment or other patient management decisions.  A negative result may occur with improper specimen collection / handling, submission of specimen other than nasopharyngeal  swab, presence of viral mutation(s) within the areas targeted by this assay, and inadequate number of viral copies (<250 copies / mL). A negative result must be combined with clinical observations, patient history, and epidemiological information.  Fact Sheet for Patients:   BoilerBrush.com.cyhttps://www.fda.gov/media/136312/download  Fact Sheet for Healthcare Providers: https://pope.com/https://www.fda.gov/media/136313/download  This test is not yet approved or  cleared by the [place] FDA and has been authorized for detection and/or diagnosis of SARS-CoV-2 by FDA under an Emergency Use Authorization (EUA).  This EUA will remain in effect (meaning this test can be used) for the duration of the COVID-19 declaration under Section 564(b)(1) of the Act, 21 U.S.C. section 360bbb-3(b)(1), unless the authorization is terminated or revoked sooner.  Performed at [facility], [address] [place], [place] [address]     Procedures and diagnostic studies:  DG Chest 1 View  Result Date: 09/12/2019 CLINICAL DATA:  Status post fall with pneumothorax seen on neck CT. EXAM: CHEST  1 VIEW COMPARISON:  August 21, 2019 FINDINGS: The lungs are hyperinflated. There is no evidence of acute infiltrate or pleural effusion. A moderate size right-sided pneumothorax is seen along the right apex and lateral aspect of the mid and upper right lung. The heart size and mediastinal contours are within normal limits. There is marked severity calcification of the aortic  arch. The visualized skeletal structures are unremarkable. IMPRESSION: 1. Moderate size right-sided pneumothorax. 2. Findings consistent with COPD. 3. Aortic atherosclerosis. 4. No acute infiltrate. Electronically Signed   By: Aram Candelahaddeus  Houston M.D.   On: 09/12/2019 16:35   DG Elbow 2 Views Right  Result Date: 09/12/2019 CLINICAL DATA:  Fall with trauma to the elbow. EXAM: RIGHT ELBOW - 2 VIEW COMPARISON:  None. FINDINGS: There is no evidence of fracture, dislocation, or joint effusion. There is no evidence of arthropathy or other focal bone abnormality. Soft tissues are unremarkable. IMPRESSION: Negative. Electronically Signed   By: Paulina FusiMark  Shogry M.D.   On: 09/12/2019 15:05   CT Head Wo Contrast  Addendum Date: 09/12/2019   ADDENDUM REPORT: 09/12/2019 15:24 ADDENDUM: These results were called by telephone at the time of interpretation on 09/12/2019 at 3:23 pm to provider Dorothea GlassmanPAUL MALINDA , who verbally acknowledged these results. Electronically Signed   By: Helyn NumbersAshesh  Parikh MD   On: 09/12/2019 15:24   Result Date: 09/12/2019 CLINICAL DATA:  Multiple falls, head injury, scalp hematoma EXAM: CT HEAD WITHOUT CONTRAST CT CERVICAL SPINE WITHOUT CONTRAST TECHNIQUE: Multidetector CT imaging of the head and cervical spine was performed following the standard protocol without intravenous contrast. Multiplanar CT image reconstructions of the cervical spine were also generated. COMPARISON:  None. FINDINGS: CT HEAD FINDINGS Brain: Moderate parenchymal volume loss is commensurate with the patient's age. Moderate periventricular and subcortical white matter changes are present likely reflecting the sequela of small vessel ischemia. Remote lacunar infarct noted within the left basal ganglia no evidence of acute intracranial hemorrhage or infarct. No abnormal mass effect or midline shift. No abnormal intra or extra-axial mass lesion. Ventricular size is normal and commensurate with the degree of volume loss. Cerebellum is  unremarkable. Vascular: No asymmetric hyperdense vasculature at the skull base. Skull: Intact Sinuses/Orbits: Next small air-fluid level noted within the left sphenoid sinus. Small mucous retention cyst noted within the right sphenoid sinus. Remaining paranasal sinuses are unremarkable. Orbits are unremarkable. Other: Mastoid air cells and middle ear cavities are clear. Small right temporal scalp hematoma is present with associated punctate subcutaneous gas in keeping with history of local  trauma. CT CERVICAL SPINE FINDINGS Alignment: There is reversal of the normal cervical lordosis without evidence of focal angulation or listhesis. Skull base and vertebrae: The craniocervical junction is unremarkable. Atlantodental interval is normal. There is no acute fracture of the cervical spine. No lytic or blastic bone lesions are seen. Soft tissues and spinal canal: No canal hematoma identified. The paraspinal soft tissues are unremarkable. Disc levels: Review of the sagittal reformats demonstrates severe intervertebral disc space narrowing and endplate remodeling at C4-5, C5-6, and C6-7 in keeping with changes of defense degenerative disc disease. Milder degenerative changes are noted at C3-4. Advanced degenerative changes are seen at the C1-2 articulation involving the left lateral mass and atlantodental articulation. Vertebral body height has been preserved. Review of the axial images demonstrates multilevel uncovertebral and facet arthrosis resulting in multilevel neural foraminal narrowing, most severe on the left at C4-5, bilaterally at C5-6, bilaterally at C6-7 and, to a milder extent, on the left at C3-4 and on the right at C4-5. There is moderate central canal stenosis at C4-5, C5-6, and C6-7 secondary to posterior disc osteophyte complexes which result in flattening of the thecal sac at these levels. Upper chest: Tiny right apical pneumothorax is present Other: None significant IMPRESSION: 1. Small right temporal  scalp hematoma with associated punctate subcutaneous gas in keeping with history of local trauma. 2. No acute intracranial abnormality. 3. No acute fracture or malalignment of the cervical spine. 4. Advanced degenerative disc disease and facet arthrosis throughout the cervical spine, most severe at C4-5, C5-6, and C6-7 where there is moderate central canal stenosis and multilevel neural foraminal narrowing. 5. Tiny right apical pneumothorax. Electronically Signed: By: Helyn Numbers MD On: 09/12/2019 15:14   CT Cervical Spine Wo Contrast  Addendum Date: 09/12/2019   ADDENDUM REPORT: 09/12/2019 15:24 ADDENDUM: These results were called by telephone at the time of interpretation on 09/12/2019 at 3:23 pm to provider Dorothea Glassman , who verbally acknowledged these results. Electronically Signed   By: Helyn Numbers MD   On: 09/12/2019 15:24   Result Date: 09/12/2019 CLINICAL DATA:  Multiple falls, head injury, scalp hematoma EXAM: CT HEAD WITHOUT CONTRAST CT CERVICAL SPINE WITHOUT CONTRAST TECHNIQUE: Multidetector CT imaging of the head and cervical spine was performed following the standard protocol without intravenous contrast. Multiplanar CT image reconstructions of the cervical spine were also generated. COMPARISON:  None. FINDINGS: CT HEAD FINDINGS Brain: Moderate parenchymal volume loss is commensurate with the patient's age. Moderate periventricular and subcortical white matter changes are present likely reflecting the sequela of small vessel ischemia. Remote lacunar infarct noted within the left basal ganglia no evidence of acute intracranial hemorrhage or infarct. No abnormal mass effect or midline shift. No abnormal intra or extra-axial mass lesion. Ventricular size is normal and commensurate with the degree of volume loss. Cerebellum is unremarkable. Vascular: No asymmetric hyperdense vasculature at the skull base. Skull: Intact Sinuses/Orbits: Next small air-fluid level noted within the left sphenoid  sinus. Small mucous retention cyst noted within the right sphenoid sinus. Remaining paranasal sinuses are unremarkable. Orbits are unremarkable. Other: Mastoid air cells and middle ear cavities are clear. Small right temporal scalp hematoma is present with associated punctate subcutaneous gas in keeping with history of local trauma. CT CERVICAL SPINE FINDINGS Alignment: There is reversal of the normal cervical lordosis without evidence of focal angulation or listhesis. Skull base and vertebrae: The craniocervical junction is unremarkable. Atlantodental interval is normal. There is no acute fracture of the cervical spine. No lytic or  blastic bone lesions are seen. Soft tissues and spinal canal: No canal hematoma identified. The paraspinal soft tissues are unremarkable. Disc levels: Review of the sagittal reformats demonstrates severe intervertebral disc space narrowing and endplate remodeling at C4-5, C5-6, and C6-7 in keeping with changes of defense degenerative disc disease. Milder degenerative changes are noted at C3-4. Advanced degenerative changes are seen at the C1-2 articulation involving the left lateral mass and atlantodental articulation. Vertebral body height has been preserved. Review of the axial images demonstrates multilevel uncovertebral and facet arthrosis resulting in multilevel neural foraminal narrowing, most severe on the left at C4-5, bilaterally at C5-6, bilaterally at C6-7 and, to a milder extent, on the left at C3-4 and on the right at C4-5. There is moderate central canal stenosis at C4-5, C5-6, and C6-7 secondary to posterior disc osteophyte complexes which result in flattening of the thecal sac at these levels. Upper chest: Tiny right apical pneumothorax is present Other: None significant IMPRESSION: 1. Small right temporal scalp hematoma with associated punctate subcutaneous gas in keeping with history of local trauma. 2. No acute intracranial abnormality. 3. No acute fracture or  malalignment of the cervical spine. 4. Advanced degenerative disc disease and facet arthrosis throughout the cervical spine, most severe at C4-5, C5-6, and C6-7 where there is moderate central canal stenosis and multilevel neural foraminal narrowing. 5. Tiny right apical pneumothorax. Electronically Signed: By: Helyn Numbers MD On: 09/12/2019 15:14   DG Chest Port 1 View  Result Date: 09/13/2019 CLINICAL DATA:  RIGHT chest tube, RIGHT closed pneumothorax post trauma, diabetes mellitus, hypertension, Alzheimer's, EXAM: PORTABLE CHEST 1 VIEW COMPARISON:  Portable exam 1001 hours compared 09/12/2019 FINDINGS: Small caliber RIGHT thoracostomy tube unchanged. Normal heart size, mediastinal contours, and pulmonary vascularity. Emphysematous and bronchitic changes consistent with COPD. Minimal RIGHT apex pneumothorax, decreased Atherosclerotic calcification aorta. Bones demineralized with multiple lateral RIGHT rib fractures. IMPRESSION: Decreased RIGHT apical pneumothorax. No new abnormalities. Electronically Signed   By: Ulyses Southward M.D.   On: 09/13/2019 10:25   DG Chest Portable 1 View  Result Date: 09/12/2019 CLINICAL DATA:  Follow-up pneumothorax EXAM: PORTABLE CHEST 1 VIEW COMPARISON:  Earlier same day FINDINGS: Right chest tube is in place. Marked reduction in right pneumothorax. Only a tiny amount of persistent pleural air. Fracture of the right lateral third, fourth and fifth ribs. Left chest remains clear. Normal heart size. Aortic atherosclerosis as seen previously. IMPRESSION: Marked reduction in right pneumothorax with only a tiny amount of persistent pleural air. Right lateral third, fourth and fifth rib fractures. Electronically Signed   By: Paulina Fusi M.D.   On: 09/12/2019 19:18               LOS: 1 day   Antwan Pandya  [facility]   Pager on [email or web address]. If 7PM-7AM, please contact night-coverage at [email or web address]     09/13/2019, 1:33 PM

## 2019-09-13 NOTE — Consult Note (Signed)
[facility] SURGICAL CONSULTATION NOTE (initial) - cpt: 99243(Outpatient/ED)   HISTORY OF PRESENT ILLNESS (HPI):  History limited secondary to history of dementia. History primarily obtained through discussion with members of the medical team and chart review.  86 y.o. female presented to [facility] ED yesterday for evaluation following a fall. Patient is a resident at an assisted living facility, and she reportedly had been having an increase in number of falls as of late. Prior to presentation, she reportedly had a mechanical trip and fall while getting out of her bed and hit her dresser. There were no reports of LOC but she did strike her head. She does not appear to be on any blood thinners. Work up in the ED was concerning for slight AKI with sCr - 1.07, mild hypokalemia to 3.4, leukocytosis to 21K (now 14K), and CXR was concerning for right sided pneumothorax. She was also found to have a scalp hematoma and right hand laceration that was repaired by EDP.   Surgery is consulted by emergency medicine physician Dr. Sharman Cheek, MD in this context for evaluation and management of traumatic right pneumothorax.   PAST MEDICAL HISTORY (PMH):  Past Medical History:  Diagnosis Date  . Alzheimer disease (HCC)   . Diabetes mellitus without complication (HCC)   . Hypercholesteremia   . Hypertension   . Hyperthyroidism      PAST SURGICAL HISTORY (PSH):  Past Surgical History:  Procedure Laterality Date  . ABDOMINAL HYSTERECTOMY    . DILATION AND CURETTAGE, DIAGNOSTIC / THERAPEUTIC    . INTRAMEDULLARY (IM) NAIL INTERTROCHANTERIC Right 04/08/2019   Procedure: INTRAMEDULLARY (IM) NAIL INTERTROCHANTRIC;  Surgeon: Signa Kell, MD;  Location: [facility] ORS;  Service: Orthopedics;  Laterality: Right;     MEDICATIONS:  Prior to Admission medications   Medication Sig Start Date End Date Taking? Authorizing Provider  alendronate (FOSAMAX) 70 MG tablet Take 1 tablet by mouth once a week.  With 8 oz of water at least 30 minutes before food/meds. Do not lie down for 30 minutes after dose. 11/11/16  Yes [provider]  alum & mag hydroxide-simeth (MYLANTA) 200-200-20 MG/5ML suspension Take 30 mLs by mouth every 6 (six) hours as needed for indigestion or heartburn.   Yes [provider]  bismuth subsalicylate (PEPTO-BISMOL) 262 MG/15ML suspension Take 15 mLs by mouth every 6 (six) hours as needed.   Yes [provider]  dextromethorphan-guaiFENesin (ROBITUSSIN COLD COUGH+ CHEST) 10-100 MG/5ML liquid Take 10 mLs by mouth every 4 (four) hours as needed for cough.   Yes [provider]  feeding supplement (BOOST HIGH PROTEIN) LIQD Take 1 Container by mouth 2 (two) times daily between meals.   Yes [provider]  morphine 10 MG/5ML solution Take 10 mg by mouth every 2 (two) hours as needed for moderate pain or severe pain.   Yes [provider]  oxybutynin (DITROPAN) 5 MG tablet Take 5 mg by mouth 2 (two) times daily.   Yes [provider]  acetaminophen (TYLENOL) 325 MG tablet Take 650 mg by mouth every 6 (six) hours as needed for mild pain, moderate pain, fever or headache.    [provider]  atenolol (TENORMIN) 25 MG tablet Take 25 mg by mouth daily.     [provider]  atorvastatin (LIPITOR) 20 MG tablet Take 1 tablet (20 mg total) by mouth daily. Patient not taking: Reported on 09/12/2019 01/05/15   [name], MD  enoxaparin (LOVENOX) 40 MG/0.4ML injection Inject 0.4 mLs (40 mg total)  into the skin daily for 14 doses. Patient not taking: Reported on 09/12/2019 04/10/19 09/12/19  Dedra SkeensMundy, Todd, PA-C  glipiZIDE (GLUCOTROL) 5 MG tablet Take 0.5 tablets (2.5 mg total) by mouth 2 (two) times daily. Patient not taking: Reported on 09/12/2019 08/25/19 12/23/19  Hartley Barefootegalado, Belkys A, MD  zinc oxide 20 % ointment Apply 1 application topically 2 (two) times daily. Apply topically to buttocks twice daily for  dermatitis    [provider]     ALLERGIES:  Allergies  Allergen Reactions  . Penicillins Itching    Did it involve swelling of the face/tongue/throat, SOB, or low BP? No Did it involve sudden or severe rash/hives, skin peeling, or any reaction on the inside of your mouth or nose? Yes Did you need to seek medical attention at a hospital or doctor's office? No When did it last happen? If all above answers are "NO", may proceed with cephalosporin use.     SOCIAL HISTORY:  Social History   Socioeconomic History  . Marital status: Widowed    Spouse name: Not on file  . Number of children: Not on file  . Years of education: Not on file  . Highest education level: Not on file  Occupational History  . Not on file  Tobacco Use  . Smoking status: Never Smoker  . Smokeless tobacco: Never Used  Substance and Sexual Activity  . Alcohol use: No  . Drug use: Not on file  . Sexual activity: Not on file  Other Topics Concern  . Not on file  Social History Narrative  . Not on file   Social Determinants of Health   Financial Resource Strain:   . Difficulty of Paying Living Expenses: Not on file  Food Insecurity:   . Worried About Programme researcher, broadcasting/film/videounning Out of Food in the Last Year: Not on file  . Ran Out of Food in the Last Year: Not on file  Transportation Needs:   . Lack of Transportation (Medical): Not on file  . Lack of Transportation (Non-Medical): Not on file  Physical Activity:   . Days of Exercise per Week: Not on file  . Minutes of Exercise per Session: Not on file  Stress:   . Feeling of Stress : Not on file  Social Connections:   . Frequency of Communication with Friends and Family: Not on file  . Frequency of Social Gatherings with Friends and Family: Not on file  . Attends Religious Services: Not on file  . Active Member of Clubs or Organizations: Not on file  . Attends BankerClub or Organization Meetings: Not on file  . Marital Status: Not on file  Intimate Partner  Violence:   . Fear of Current or Ex-Partner: Not on file  . Emotionally Abused: Not on file  . Physically Abused: Not on file  . Sexually Abused: Not on file     FAMILY HISTORY:  Family History  Problem Relation Age of Onset  . Cancer Other        multiple family members including all siblings      REVIEW OF SYSTEMS:  Review of Systems  Unable to perform ROS: Dementia  Musculoskeletal: Positive for falls.    VITAL SIGNS:  Temp:  [97.5 F (36.4 C)-99.2 F (37.3 C)] 98.4 F (36.9 C) (09/01 0721) Pulse Rate:  [56-88] 65 (09/01 0721) Resp:  [14-22] 17 (09/01 0721) BP: (102-170)/(64-119) 129/85 (09/01 0721) SpO2:  [91 %-98 %] 97 % (09/01 0721) Weight:  [50.8 kg-53 kg] 50.8 kg (09/01 0425)  Height: 5\' 9"  (175.3 cm) Weight: 50.8 kg BMI (Calculated): 16.53   INTAKE/OUTPUT:  08/31 0701 - 09/01 0700 In: 9  Out: 140 [Urine:140]  PHYSICAL EXAM:  Physical Exam Vitals and nursing note reviewed. Exam conducted with a chaperone present.  Constitutional:      General: She is not in acute distress.    Appearance: Normal appearance. She is not ill-appearing.     Comments: Patient resting in bed, arouses to verbal stimuli, does not follow commands well  HENT:     Head: Normocephalic.     Comments: Scalp hematoma    Mouth/Throat:     Mouth: Mucous membranes are dry.     Pharynx: Oropharynx is clear.  Eyes:     Extraocular Movements: Extraocular movements intact.     Conjunctiva/sclera: Conjunctivae normal.     Pupils: Pupils are equal, round, and reactive to light.  Cardiovascular:     Rate and Rhythm: Normal rate and regular rhythm.     Pulses: Normal pulses.  Pulmonary:     Effort: Pulmonary effort is normal. No respiratory distress.     Breath sounds: Normal breath sounds.  Chest:    Musculoskeletal:     Right lower leg: No edema.     Left lower leg: No edema.     Comments: Safety mittens in place  Skin:    General: Skin is warm and dry.     Comments: Laceration  to right hand, wrapped  Neurological:     Mental Status: She is alert.     Comments: Unable to reliable assess secondary to dementia  Psychiatric:     Comments: Unable to reliable assess secondary to dementia      Labs:  CBC Latest Ref Rng & Units 09/13/2019 09/12/2019 08/25/2019  WBC 4.0 - 10.5 K/uL 14.3(H) 21.6(H) 11.0(H)  Hemoglobin 12.0 - 15.0 g/dL 11.6(L) 12.3 12.2  Hematocrit 36 - 46 % 34.5(L) 37.8 36.0  Platelets 150 - 400 K/uL 257 296 192   CMP Latest Ref Rng & Units 09/13/2019 09/12/2019 08/25/2019  Glucose 70 - 99 mg/dL 08/27/2019) 237(S) 283(T)  BUN 8 - 23 mg/dL 517(O) 16(W) 23  Creatinine 0.44 - 1.00 mg/dL 73(X 1.06) 2.69(S  Sodium 135 - 145 mmol/L 136 135 135  Potassium 3.5 - 5.1 mmol/L 3.5 3.4(L) 3.7  Chloride 98 - 111 mmol/L 98 93(L) 99  CO2 22 - 32 mmol/L 28 30 26   Calcium 8.9 - 10.3 mg/dL 11.1(H) 11.7(H) 10.2  Total Protein 6.5 - 8.1 g/dL 7.1 - -  Total Bilirubin 0.3 - 1.2 mg/dL 1.1 - -  Alkaline Phos 38 - 126 U/L 82 - -  AST 15 - 41 U/L 47(H) - -  ALT 0 - 44 U/L 38 - -   Imaging studies:   CXR (09/12/2019 - 1630) personally reviewed showing right sided pneumothorax, and radiologist report reviewed:  IMPRESSION: 1. Moderate size right-sided pneumothorax. 2. Findings consistent with COPD. 3. Aortic atherosclerosis. 4. No acute infiltrate.   CXR (09/12/2019 - 1900) personally reviewed showing interval placement of chest tube and resolution in right pneumothorax, and radiologist report reviewed:  IMPRESSION: Marked reduction in right pneumothorax with only a tiny amount of persistent pleural air. Right lateral third, fourth and fifth rib Fractures.   Assessment/Plan: (ICD-10's: J93.9) 86 y.o. female with mechanical fall at assisted living facility resulting in traumatic right pneumothorax s/p chest tube placement on 08/31 by EDP, complicated by pertinent comorbidities including advanced age and dementia.   - Appreciate medicine admission  -  Recommend continue  chest tube to -20 cm suction for typically 24-48 hours prior to considering going to water seal.   - We will not get much subjective data from her, so I recommend daily CXR at minimum  - Pain control prn  - Pulmonary toilet  - mobilize to best of her ability if feasible   - Further management per primary service; we will follow   All of the above findings and recommendations were discussed with the medical team  Thank you for the opportunity to participate in this patient's care.   -- Lynden Oxford, PA-C [facility] 09/13/2019, 7:26 AM [phone] M-F: 7am - 4pm

## 2019-09-14 ENCOUNTER — Inpatient Hospital Stay: Payer: Medicare Other

## 2019-09-14 DIAGNOSIS — G309 Alzheimer's disease, unspecified: Secondary | ICD-10-CM

## 2019-09-14 DIAGNOSIS — Z9689 Presence of other specified functional implants: Secondary | ICD-10-CM

## 2019-09-14 DIAGNOSIS — Z515 Encounter for palliative care: Secondary | ICD-10-CM

## 2019-09-14 DIAGNOSIS — F0280 Dementia in other diseases classified elsewhere without behavioral disturbance: Secondary | ICD-10-CM

## 2019-09-14 DIAGNOSIS — Z66 Do not resuscitate: Secondary | ICD-10-CM

## 2019-09-14 DIAGNOSIS — Z7189 Other specified counseling: Secondary | ICD-10-CM

## 2019-09-14 LAB — BASIC METABOLIC PANEL
Anion gap: 9 (ref 5–15)
BUN: 24 mg/dL — ABNORMAL HIGH (ref 8–23)
CO2: 30 mmol/L (ref 22–32)
Calcium: 11.1 mg/dL — ABNORMAL HIGH (ref 8.9–10.3)
Chloride: 98 mmol/L (ref 98–111)
Creatinine, Ser: 0.75 mg/dL (ref 0.44–1.00)
GFR calc Af Amer: >60 mL/min (ref >60)
GFR calc non Af Amer: >60 mL/min (ref >60)
Glucose, Bld: 105 mg/dL — ABNORMAL HIGH (ref 70–99)
Potassium: 3.4 mmol/L — ABNORMAL LOW (ref 3.5–5.1)
Sodium: 137 mmol/L (ref 135–145)

## 2019-09-14 LAB — CBC WITH DIFFERENTIAL/PLATELET
Abs Immature Granulocytes: 0.05 10*3/uL (ref 0.00–0.07)
Basophils Absolute: 0 10*3/uL (ref 0.0–0.1)
Basophils Relative: 0 %
Eosinophils Absolute: 0.1 10*3/uL (ref 0.0–0.5)
Eosinophils Relative: 1 %
HCT: 35.1 % — ABNORMAL LOW (ref 36.0–46.0)
Hemoglobin: 11.9 g/dL — ABNORMAL LOW (ref 12.0–15.0)
Immature Granulocytes: 0 %
Lymphocytes Relative: 11 %
Lymphs Abs: 1.3 10*3/uL (ref 0.7–4.0)
MCH: 30.1 pg (ref 26.0–34.0)
MCHC: 33.9 g/dL (ref 30.0–36.0)
MCV: 88.9 fL (ref 80.0–100.0)
Monocytes Absolute: 1.3 10*3/uL — ABNORMAL HIGH (ref 0.1–1.0)
Monocytes Relative: 11 %
Neutro Abs: 9.1 10*3/uL — ABNORMAL HIGH (ref 1.7–7.7)
Neutrophils Relative %: 77 %
Platelets: 255 10*3/uL (ref 150–400)
RBC: 3.95 MIL/uL (ref 3.87–5.11)
RDW: 15.9 % — ABNORMAL HIGH (ref 11.5–15.5)
WBC: 11.8 10*3/uL — ABNORMAL HIGH (ref 4.0–10.5)
nRBC: 0 % (ref 0.0–0.2)

## 2019-09-14 LAB — GLUCOSE, CAPILLARY
Glucose-Capillary: 54 mg/dL — ABNORMAL LOW (ref 70–99)
Glucose-Capillary: 67 mg/dL — ABNORMAL LOW (ref 70–99)
Glucose-Capillary: 130 mg/dL — ABNORMAL HIGH (ref 70–99)
Glucose-Capillary: 280 mg/dL — ABNORMAL HIGH (ref 70–99)
Glucose-Capillary: 71 mg/dL (ref 70–99)
Glucose-Capillary: 147 mg/dL — ABNORMAL HIGH (ref 70–99)

## 2019-09-14 MED ORDER — TRAMADOL HCL 50 MG PO TABS
50.0000 mg | ORAL_TABLET | Freq: Four times a day (QID) | ORAL | Status: DC | PRN
  Administered 2019-09-14 – 2019-09-15 (×2): 50 mg via ORAL
  Filled 2019-09-14 (×2): qty 1

## 2019-09-14 MED ORDER — POTASSIUM CHLORIDE 20 MEQ PO PACK
40.0000 meq | PACK | Freq: Once | ORAL | Status: AC
  Administered 2019-09-14: 40 meq via ORAL
  Filled 2019-09-14: qty 2

## 2019-09-14 MED ORDER — ENSURE ENLIVE PO LIQD
237.0000 mL | Freq: Three times a day (TID) | ORAL | Status: DC
  Administered 2019-09-15 (×2): 237 mL via ORAL

## 2019-09-14 MED ORDER — ADULT MULTIVITAMIN W/MINERALS CH
1.0000 | ORAL_TABLET | Freq: Every day | ORAL | Status: DC
  Administered 2019-09-14 – 2019-09-15 (×2): 1 via ORAL
  Filled 2019-09-14 (×2): qty 1

## 2019-09-14 NOTE — Progress Notes (Signed)
Initial Nutrition Assessment  DOCUMENTATION CODES:   Underweight (Suspect ongoing PCM)  INTERVENTION:  Ensure Enlive po TID, each supplement provides 350 kcal and 20 grams of protein Magic cup TID with meals, each supplement provides 290 kcal and 9 grams of protein MVI with minerals daily  Pt at increased risk for refeeding given poor po intake; recommend monitoring K, Mg, and P labs daily until stable  NUTRITION DIAGNOSIS:   Inadequate oral intake related to lethargy/confusion, chronic illness (Alzheimer's demenita) as evidenced by meal completion < 25%.  GOAL:   Patient will meet greater than or equal to 90% of their needs    MONITOR:   PO intake, Supplement acceptance, Labs, Weight trends, I & O's, Skin  REASON FOR ASSESSMENT:   Malnutrition Screening Tool    ASSESSMENT:  RD working remotely.   86 year old female with history significant of Alzheimer's dementia, hard of hearing, DM, HTN, hypothyroidism, hip fx in March s/p repair, and recent hospital admission due to weakness, found to have UTI and sepsis, presented from facility after sustaining a fall while getting out of bed, admitted with scalp hematoma and pneumothorax requiring chest tube  Palliative met with family today, niece of pt and legal guardian reports recent progressive decline with frequent falls, family desires to return to ALF with hospice at discharge.  Patient recently seen by nutrition department on 8/11 during prior admission, noted with severe fat and muscle depletions on NPFE as well as significant wt loss and diagnosed with severe malnutrition. Highly suspect this is ongoing, however unable to identify at this time. Per flowsheets, pt consumed 0% of breakfast and lunch meals today. She was accepting of Ensure supplements during last admission, will order TID as well Magic Cups on meal trays to aid with meeting needs.   Current wt 112 lbs, per chart weights have trended down 48 lbs (30%) in the  last 5 months; significant.   Medications reviewed and include: Colace, Senokot, SSI, Oxybutynin  Labs: CBGs 280,130,67,54, K 3.4 (L), BUN 24 (H), Ca 11.1 (H), WBC 11.8 (H), Hgb 11.9 (L), HCT 35.1 (L) 9/1 Mg 1.9 (WNL)  NUTRITION - FOCUSED PHYSICAL EXAM: Unable to complete at this time, RD working remotely.  Diet Order:   Diet Order            DIET SOFT Room service appropriate? Yes; Fluid consistency: Thin  Diet effective now                 EDUCATION NEEDS:   Not appropriate for education at this time  Skin:  Skin Assessment: Reviewed RN Assessment (abraison;arm;hand; ecchymosis;scattered)  Last BM:  pta  Height:   Ht Readings from Last 1 Encounters:  09/12/19 5' 9"  (1.753 m)    Weight:   Wt Readings from Last 1 Encounters:  09/13/19 50.8 kg    Ideal Body Weight:  65.9 kg  BMI:  Body mass index is 16.54 kg/m.  Estimated Nutritional Needs:   Kcal:  1400-1600  Protein:  70-80  Fluid:  > 1.3 L/day   Lajuan Lines, RD, LDN Clinical Nutrition After Hours/Weekend Pager # in [place]

## 2019-09-14 NOTE — TOC Initial Note (Signed)
Transition of Care [facility]) - Initial/Assessment Note    Patient Details  Name: Chrysta Fulcher MRN: [id number] Date of Birth: 10-Oct-1925  Transition of Care [facility]) CM/SW Contact:    [place] Cellar, RN Phone Number: 09/14/2019, 1:37 PM  Clinical Narrative:                  Family requested patient return to ALF with [facility] following. Family informed staff that they were not interested in SNF placement regardless of PT recommendations and only wanted to return to ALF with Hospice.   TOC RN CM called [facility] @ [phone] and left message for Tammy Mckinney-[phone] to return call regarding patient returning to [facility].         Patient Goals and CMS Choice        Expected Discharge Plan and Services                                                Prior Living Arrangements/Services                       Activities of Daily Living      Permission Sought/Granted                  Emotional Assessment              Admission diagnosis:  Pneumothorax, closed, traumatic [S27.0XXA] Fall, initial encounter [id number]] Scalp hematoma, initial encounter [S00.03XA] Laceration of right hand without foreign body, initial encounter [S61.411A] Type 2 diabetes mellitus without complication, without long-term current use of insulin (HCC) [E11.9] Pneumothorax, unspecified type [J93.9] Alzheimer's dementia without behavioral disturbance, unspecified timing of dementia onset (HCC) [G30.9, F02.80] Patient Active Problem List   Diagnosis Date Noted  . Pneumothorax   . Pneumothorax, closed, traumatic 09/12/2019  . Protein-calorie malnutrition, severe 08/23/2019  . Lactic acidosis 08/22/2019  . Sepsis secondary to UTI (HCC) 08/22/2019  . Acute metabolic encephalopathy 08/22/2019  . Inadequate oral intake 08/22/2019  . Dementia without behavioral disturbance (HCC) 08/22/2019  . Diabetes mellitus without complication (HCC)  08/22/2019  . Closed right hip fracture (HCC) 04/08/2019  . Hypercalcemia 05/22/2015  . Elevated troponin 01/02/2015  . Rhabdomyolysis 01/02/2015   PCP:  Mickel Fuchs, MD Pharmacy:   [facility] - [place], [place] - [address] [place] [address] Phone: [phone] Fax: [phone]  [facility] - [place], [place] - [address] [place] [address] Phone: [phone] Fax: [phone]     Social Determinants of Health (SDOH) Interventions    Readmission Risk Interventions No flowsheet data found.

## 2019-09-14 NOTE — Progress Notes (Addendum)
During bedside report I noticed a large air leak on the chamber. Per night nurse pt tried pulling chest tube twice overnight. The chest tube had been placed inside of another connector and tape was applied to try to keep it secure over night. Dr. Thelma Barge notified of the situation and asked if he could come assess patient. Received verbal orders for STAT portable chest x-ray. Patient stable, VSS, O2 at 90% on R/A. Will continue to monitor closely

## 2019-09-14 NOTE — Progress Notes (Addendum)
Progress Note    Jenna PhilipsBarbara Vernell Allocca  ZOX:[id number] DOB: 09-30-1925  DOA: 09/12/2019 PCP: Mickel FuchsWroth, Thomas H, MD      Brief Narrative:    Medical records reviewed and are as summarized below:  Jenna PhilipsBarbara Vernell Robinson is a 86 y.o. female       Assessment/Plan:   Active Problems:   Pneumothorax, closed, traumatic   Pneumothorax   Goals of care, counseling/discussion   Palliative care by specialist   DNR (do not resuscitate)  Closed traumatic right apical pneumothorax s/p fall Small, right scalp hematoma Moderate right pleural effusion Type II DM Hypercalcemia Hypokalemia Leukocytosis, improving Hypoglycemia COPD, compensated Hypertension Alzheimer's dementia  PLAN  S/p chest tube placement by ED physician on 09/12/2019 Chest tube was removed today by Dr. Thelma Bargeaks, CT surgeon Repeat chest x-ray shows tiny right apical pneumothorax Continue analgesics as needed for pain Replete potassium Continue antihypertensives Calcium level is still slightly elevated.  Continue to monitor. Humalog as needed for hyperglycemia Use SCDs for DVT prophylaxis and discontinue Lovenox because of bleeding at chest tube site and scalp hematoma. Continue supportive care Plan to discharge patient home tomorrow   Body mass index is 16.54 kg/m.  (Underweight)  Diet Order            DIET SOFT Room service appropriate? Yes; Fluid consistency: Thin  Diet effective now                     Medications:   . atenolol  25 mg Oral Daily  . docusate sodium  100 mg Oral BID  . feeding supplement (ENSURE ENLIVE)  237 mL Oral TID BM  . insulin aspart  0-9 Units Subcutaneous TID WC  . multivitamin with minerals  1 tablet Oral Daily  . oxybutynin  5 mg Oral BID  . senna  1 tablet Oral BID   Continuous Infusions:   Anti-infectives (From admission, onward)   None             Family Communication/Anticipated D/C date and plan/Code Status   DVT prophylaxis: Place and  maintain sequential compression device Start: 09/13/19 1209 Place and maintain sequential compression device Start: 09/13/19 1205     Code Status: DNR  Family Communication: Plan discussed with patient Disposition Plan:    Status is: Inpatient  Remains inpatient appropriate because:Inpatient level of care appropriate due to severity of illness and She still has a chest tube in place   Dispo: The patient is from: ALF              Anticipated d/c is to: ALF              Anticipated d/c date is: 1 day              Patient currently is not medically stable to d/c.           Subjective:   Interval events noted.  Glucose dropped to 54 this morning.  She has pain on right side of chest wall  (Previous chest tube site).  No shortness of breath.  Objective:    Vitals:   09/14/19 0511 09/14/19 0801 09/14/19 1140 09/14/19 1603  BP: (!) 161/85 (!) 179/87 139/74 (!) 129/47  Pulse: (!) 55 63 64 61  Resp:   18 18  Temp: 97.8 F (36.6 C)  97.6 F (36.4 C) 98 F (36.7 C)  TempSrc: Oral  Oral Oral  SpO2: 91% 90% 100% 99%  Weight:  Height:       No data found.   Intake/Output Summary (Last 24 hours) at 09/14/2019 1655 Last data filed at 09/14/2019 1140 Gross per 24 hour  Intake 0 ml  Output 0 ml  Net 0 ml   Filed Weights   09/12/19 1351 09/13/19 0425  Weight: 53 kg 50.8 kg    Exam:  GEN: No acute distress SKIN: Multiple bruises/ecchymoses/skin tears on bilateral upper extremities.  Dressing on chest tube site a small amount of blood staining. EYES: EOMI ENT: MMM CV: Regular rate and rhythm PULM: No wheezing or rales heard.   ABD: Soft and nontender CNS: Alert and oriented to person and place EXT: No edema or tenderness MSK: Right chest wall tenderness at the venous chest tube site   Data Reviewed:   I have personally reviewed following labs and imaging studies:  Labs: Labs show the following:   Basic Metabolic Panel: Recent Labs  Lab 09/12/19 1647  09/12/19 1647 09/13/19 0409 09/13/19 0449 09/14/19 0428  NA 135  --  136  --  137  K 3.4*   < > 3.5  --  3.4*  CL 93*  --  98  --  98  CO2 30  --  28  --  30  GLUCOSE 175*  --  161*  --  105*  BUN 30*  --  26*  --  24*  CREATININE 1.07*  --  0.88  --  0.75  CALCIUM 11.7*  --  11.1*  --  11.1*  MG  --   --   --  1.9  --    < > = values in this interval not displayed.   GFR Estimated Creatinine Clearance: 36.7 mL/min (by C-G formula based on SCr of 0.75 mg/dL). Liver Function Tests: Recent Labs  Lab 09/13/19 0409  AST 47*  ALT 38  ALKPHOS 82  BILITOT 1.1  PROT 7.1  ALBUMIN 3.9   No results for input(s): LIPASE, AMYLASE in the last 168 hours. No results for input(s): AMMONIA in the last 168 hours. Coagulation profile Recent Labs  Lab 09/13/19 0409  INR 1.0    CBC: Recent Labs  Lab 09/12/19 1647 09/13/19 0409 09/14/19 0428  WBC 21.6* 14.3* 11.8*  NEUTROABS 18.7*  --  9.1*  HGB 12.3 11.6* 11.9*  HCT 37.8 34.5* 35.1*  MCV 91.7 89.6 88.9  PLT 296 257 255   Cardiac Enzymes: No results for input(s): CKTOTAL, CKMB, CKMBINDEX, TROPONINI in the last 168 hours. BNP (last 3 results) No results for input(s): PROBNP in the last 8760 hours. CBG: Recent Labs  Lab 09/13/19 2045 09/14/19 0832 09/14/19 0911 09/14/19 0941 09/14/19 1139  GLUCAP 77 54* 67* 130* 280*   D-Dimer: No results for input(s): DDIMER in the last 72 hours. Hgb A1c: No results for input(s): HGBA1C in the last 72 hours. Lipid Profile: No results for input(s): CHOL, HDL, LDLCALC, TRIG, CHOLHDL, LDLDIRECT in the last 72 hours. Thyroid function studies: No results for input(s): TSH, T4TOTAL, T3FREE, THYROIDAB in the last 72 hours.  Invalid input(s): FREET3 Anemia work up: No results for input(s): VITAMINB12, FOLATE, FERRITIN, TIBC, IRON, RETICCTPCT in the last 72 hours. Sepsis Labs: Recent Labs  Lab 09/12/19 1647 09/13/19 0409 09/14/19 0428  WBC 21.6* 14.3* 11.8*    Microbiology Recent  Results (from the past 240 hour(s))  SARS Coronavirus 2 by RT PCR (hospital order, performed in [facility] hospital lab) Nasopharyngeal Nasopharyngeal Swab     Status: None  Collection Time: 09/12/19  4:47 PM   Specimen: Nasopharyngeal Swab  Result Value Ref Range Status   SARS Coronavirus 2 NEGATIVE NEGATIVE Final    Comment: (NOTE) SARS-CoV-2 target nucleic acids are NOT DETECTED.  The SARS-CoV-2 RNA is generally detectable in upper and lower respiratory specimens during the acute phase of infection. The lowest concentration of SARS-CoV-2 viral copies this assay can detect is 250 copies / mL. A negative result does not preclude SARS-CoV-2 infection and should not be used as the sole basis for treatment or other patient management decisions.  A negative result may occur with improper specimen collection / handling, submission of specimen other than nasopharyngeal swab, presence of viral mutation(s) within the areas targeted by this assay, and inadequate number of viral copies (<250 copies / mL). A negative result must be combined with clinical observations, patient history, and epidemiological information.  Fact Sheet for Patients:   BoilerBrush.com.cy  Fact Sheet for Healthcare Providers: https://pope.com/  This test is not yet approved or  cleared by the [place] FDA and has been authorized for detection and/or diagnosis of SARS-CoV-2 by FDA under an Emergency Use Authorization (EUA).  This EUA will remain in effect (meaning this test can be used) for the duration of the COVID-19 declaration under Section 564(b)(1) of the Act, 21 U.S.C. section 360bbb-3(b)(1), unless the authorization is terminated or revoked sooner.  Performed at [facility], [address], [place], [place] [address]   Urine culture     Status: Abnormal   Collection Time: 09/12/19  6:18 PM   Specimen: Urine, Random  Result Value Ref Range  Status   Specimen Description   Final    URINE, RANDOM Performed at [facility], [address] [place], [place] [address]    Special Requests   Final    NONE Performed at [facility], [address] [place], [place] [address]    Culture MULTIPLE SPECIES PRESENT, SUGGEST RECOLLECTION (A)  Final   Report Status 09/13/2019 FINAL  Final    Procedures and diagnostic studies:  DG Chest 1 View  Result Date: 09/14/2019 CLINICAL DATA:  Right pneumothorax status post right chest tube placement EXAM: CHEST  1 VIEW COMPARISON:  09/13/2019 FINDINGS: Single frontal view of the chest demonstrates stable position of the right-sided chest tube. There is trace residual right apical pneumothorax, volume estimated far less than 5%. No acute airspace disease or effusion. Multiple right-sided rib fractures are again seen unchanged. Subcutaneous gas within the right lateral chest wall consistent with chest tube placement. IMPRESSION: 1. Trace residual right apical pneumothorax, with stable position of right chest tube. 2. Stable right-sided rib fractures. 3. Subcutaneous emphysema right chest wall, stable. Electronically Signed   By: Sharlet Salina M.D.   On: 09/14/2019 02:29   DG Chest Port 1 View  Result Date: 09/14/2019 CLINICAL DATA:  Postoperative pneumonia, diabetes mellitus, hypertension, Alzheimer's EXAM: PORTABLE CHEST 1 VIEW COMPARISON:  Portable exam 1354 hours compared to 09/14/2019 at 0802 hours FINDINGS: Normal heart size, mediastinal contours, and pulmonary vascularity. Atherosclerotic calcification aorta. Bronchitic changes without infiltrate or pleural effusion. Persistent tiny RIGHT apex pneumothorax. No acute osseous findings. IMPRESSION: Bronchitic changes with persistent tiny RIGHT apex pneumothorax. No new abnormalities. Electronically Signed   By: Ulyses Southward M.D.   On: 09/14/2019 14:09   DG Chest Port 1 View  Result Date: 09/14/2019 CLINICAL DATA:  Pneumothorax. EXAM:  PORTABLE CHEST 1 VIEW COMPARISON:  September 14, 2019. FINDINGS: The heart size and mediastinal  contours are within normal limits. Left lung is clear. Right-sided chest tube has been removed. Probable stable minimal right apical pneumothorax is noted. Stable subcutaneous emphysema is seen over right lateral chest wall. Multiple mildly displaced right rib fractures are noted. IMPRESSION: Probable stable minimal right apical pneumothorax status post right-sided chest tube removal. Multiple mildly displaced right rib fractures are noted. Aortic Atherosclerosis (ICD10-I70.0). Electronically Signed   By: Lupita Raider M.D.   On: 09/14/2019 08:22   DG Chest Port 1 View  Result Date: 09/13/2019 CLINICAL DATA:  RIGHT chest tube, RIGHT closed pneumothorax post trauma, diabetes mellitus, hypertension, Alzheimer's, EXAM: PORTABLE CHEST 1 VIEW COMPARISON:  Portable exam 1001 hours compared 09/12/2019 FINDINGS: Small caliber RIGHT thoracostomy tube unchanged. Normal heart size, mediastinal contours, and pulmonary vascularity. Emphysematous and bronchitic changes consistent with COPD. Minimal RIGHT apex pneumothorax, decreased Atherosclerotic calcification aorta. Bones demineralized with multiple lateral RIGHT rib fractures. IMPRESSION: Decreased RIGHT apical pneumothorax. No new abnormalities. Electronically Signed   By: Ulyses Southward M.D.   On: 09/13/2019 10:25   DG Chest Portable 1 View  Result Date: 09/12/2019 CLINICAL DATA:  Follow-up pneumothorax EXAM: PORTABLE CHEST 1 VIEW COMPARISON:  Earlier same day FINDINGS: Right chest tube is in place. Marked reduction in right pneumothorax. Only a tiny amount of persistent pleural air. Fracture of the right lateral third, fourth and fifth ribs. Left chest remains clear. Normal heart size. Aortic atherosclerosis as seen previously. IMPRESSION: Marked reduction in right pneumothorax with only a tiny amount of persistent pleural air. Right lateral third, fourth and fifth rib  fractures. Electronically Signed   By: Paulina Fusi M.D.   On: 09/12/2019 19:18               LOS: 2 days   Lawana Hartzell  [facility]   Pager on [email or web address]. If 7PM-7AM, please contact night-coverage at [email or web address]     09/14/2019, 4:55 PM

## 2019-09-14 NOTE — Progress Notes (Addendum)
Hypoglycemic Event  CBG: 54   Treatment: 4 oz juice/soda  Symptoms: None  Follow-up CBG: Time:0941 CBG Result:130  Possible Reasons for Event: Inadequate meal intake  Comments/MD notified: MD aware    Rigoberto Noel

## 2019-09-14 NOTE — Progress Notes (Signed)
This RN was notified by CNA that pt's chest tube was not connected when she went to clean up the patient at 2100. This RN, as well as Agricultural consultant went into patients room to assess chest tube. The chest tube catheter inside the pt remained in place, however the connecting piece (pigtail) that connects to the atrium/tube was not connected. When we tried to reconnect it, it would not reattach. Clamp was placed at this time, and checked pt's VS.  Paged hospitalist on call, whom advised to page surgery. Surgeon called at 2112, after explaining situation he stated that because the chest tube was still intact inside the patient that he would not come to the bedside to assess. He stated that we need to find a way to reconnect it. We had to use a chest tube connector from a chest tube kit to try and connect it to the atrium, however it doesn't really fit because the piece that connects to the chest tube is small. So we are having to make a tight seal with tape to keep it in place and make a good seal.  I made the surgeon and hospitalist on call aware that we are essentially rigging it to connect it to the atrium. Since doing so there is now bubbling in the waterseal chamber which wasn't there prior.   At 0010 this RN was made aware that patient chest tube piece disconnected. Notified hospitalist, whom came to bedside and stated that since the chest tube is still intact inside the patient, that no other interventions should be made other than trying to keep it together with tape. This RN then spoke again about the concern for continuous bubbling inside waterseal chamber. Hospitalist put in order for STAT chest xray which did confirm that chest tube is still in correct position, however there is now subcutaneous emphysema. No new orders given at this time. Will continue to monitor patient.  During these events patient VSS and alert/oriented at baseline.

## 2019-09-14 NOTE — Consult Note (Signed)
Consultation Note Date: 09/14/2019   Patient Name: Jenna Robinson  DOB: 02/21/1925  MRN: [id number]  Age / Sex: 86 y.o., female  PCP: Mickel Fuchs, MD Referring Physician: Lurene Shadow, MD  Reason for Consultation: Hospice Evaluation  HPI/Patient Profile: 86 y.o. female  with past medical history of dementia, falls, T2DM, and HTN admitted on 09/12/2019 with fall and R side pneumothorax requiring chest tube. PMT consulted as patient's niece expressed interest in hospice services upon discharge.    Clinical Assessment and Goals of Care: I have reviewed medical records including EPIC notes, labs and imaging, assessed the patient and then spoke with patient's legal guardian Jenna Robinson  to discuss diagnosis prognosis, GOC, EOL wishes, disposition and options.  I introduced Palliative Medicine as specialized medical care for people living with serious illness. It focuses on providing relief from the symptoms and stress of a serious illness. The goal is to improve quality of life for both the patient and the family.  We discussed a brief life review of the patient. Jenna Robinson tells me patient's spouse passed away 20 years ago and she does not have children. She is estranged from other family members. Jenna Robinson has served as Proofreader. She tells me she is court appointed legal guardian.   Jenna Robinson tells me about patient's decline with frequent falls and recurrent UTIs.    We discussed patient's current illness and what it means in the larger context of patient's on-going co-morbidities.  Natural disease trajectory and expectations at EOL were discussed. Discussed dementia disease process.   I attempted to elicit values and goals of care important to the patient.  Jenna Robinson expresses she would like to promote patient's comfort above all else.   Discussed with Jenna Robinson the importance of continued conversation with family and the medical providers  regarding overall plan of care and treatment options, ensuring decisions are within the context of the patient's values and GOCs.    Jenna Robinson had been speaking to hospice prior to admission [facility]. She had been educated on hospice philosophy. She is interested in initiating services with [facility] upon patient's discharge back to SNF. She is not interested in rehab even if recommended by PT. She would like patient back in facility she is familiar with.   Questions and concerns were addressed. The family was encouraged to call with questions or concerns.   Primary Decision Maker LEGAL GUARDIAN    SUMMARY OF RECOMMENDATIONS   - discussed with case manager family desire to return to ALF with hospice (family interested in [facility])  Code Status/Advance Care Planning:  DNR  Prognosis:   < 6 months  Discharge Planning: ALF with hospice      Primary Diagnoses: Present on Admission: . Pneumothorax, closed, traumatic   I have reviewed the medical record, interviewed the patient and family, and examined the patient. The following aspects are pertinent.  Past Medical History:  Diagnosis Date  . Alzheimer disease (HCC)   . Diabetes mellitus without complication (HCC)   . Hypercholesteremia   . Hypertension   . Hyperthyroidism    Social History   Socioeconomic History  . Marital status: Widowed    Spouse name: Not on file  . Number of children: Not on file  . Years of education: Not on file  . Highest education level: Not on file  Occupational History  . Not on file  Tobacco Use  . Smoking status: Never Smoker  . Smokeless tobacco: Never Used  Substance and  Sexual Activity  . Alcohol use: No  . Drug use: Not on file  . Sexual activity: Not on file  Other Topics Concern  . Not on file  Social History Narrative  . Not on file   Social Determinants of Health   Financial Resource Strain:   . Difficulty of Paying Living Expenses: Not on file    Food Insecurity:   . Worried About Programme researcher, broadcasting/film/video in the Last Year: Not on file  . Ran Out of Food in the Last Year: Not on file  Transportation Needs:   . Lack of Transportation (Medical): Not on file  . Lack of Transportation (Non-Medical): Not on file  Physical Activity:   . Days of Exercise per Week: Not on file  . Minutes of Exercise per Session: Not on file  Stress:   . Feeling of Stress : Not on file  Social Connections:   . Frequency of Communication with Friends and Family: Not on file  . Frequency of Social Gatherings with Friends and Family: Not on file  . Attends Religious Services: Not on file  . Active Member of Clubs or Organizations: Not on file  . Attends Banker Meetings: Not on file  . Marital Status: Not on file   Family History  Problem Relation Age of Onset  . Cancer Other        multiple family members including all siblings   Scheduled Meds: . atenolol  25 mg Oral Daily  . docusate sodium  100 mg Oral BID  . insulin aspart  0-9 Units Subcutaneous TID WC  . oxybutynin  5 mg Oral BID  . senna  1 tablet Oral BID   Continuous Infusions: PRN Meds:.acetaminophen **OR** acetaminophen, alum & mag hydroxide-simeth, hydrALAZINE, ondansetron **OR** ondansetron (ZOFRAN) IV, traMADol Allergies  Allergen Reactions  . Penicillins Itching    Did it involve swelling of the face/tongue/throat, SOB, or low BP? No Did it involve sudden or severe rash/hives, skin peeling, or any reaction on the inside of your mouth or nose? Yes Did you need to seek medical attention at a hospital or doctor's office? No When did it last happen? If all above answers are "NO", may proceed with cephalosporin use.   Review of Systems  Unable to perform ROS: Dementia    Physical Exam Constitutional:      General: She is not in acute distress.    Comments: Sleeping, does not wake to voice  Pulmonary:     Effort: Pulmonary effort is normal. No respiratory  distress.  Skin:    General: Skin is warm and dry.     Vital Signs: BP 139/74 (BP Location: Left Arm)   Pulse 64   Temp 97.6 F (36.4 C) (Oral)   Resp 18   Ht 5\' 9"  (1.753 m)   Wt 50.8 kg   SpO2 100%   BMI 16.54 kg/m  Pain Scale: 0-10   Pain Score: Asleep   SpO2: SpO2: 100 % O2 Device:SpO2: 100 % O2 Flow Rate: .   IO: Intake/output summary:   Intake/Output Summary (Last 24 hours) at 09/14/2019 1412 Last data filed at 09/14/2019 1140 Gross per 24 hour  Intake 0 ml  Output 0 ml  Net 0 ml    LBM: Last BM Date:  (UTA) Baseline Weight: Weight: 53 kg Most recent weight: Weight: 50.8 kg     Palliative Assessment/Data: PPS 50%    Time Total: 60 minutes Greater than 50%  of this time  was spent counseling and coordinating care related to the above assessment and plan.  Juel Burrow, DNP, AGNP-C Palliative Medicine Team [phone] Pager: [phone]

## 2019-09-14 NOTE — Progress Notes (Signed)
I was asked to see this patient urgently this morning.  The nurse taking care of the patient noticed that there is a large air leak.  When I examined the patient I found that the chest tube had been placed inside of another connector and tape was applied.  The connector in the tube had become disconnected and severed overnight and a makeshift connector was attempted but was unsuccessful.  The patient was stable and the tube was obviously nonfunctional so I removed it from her chest.  We did repeat a chest x-ray which showed that the lung was near fully expanded.  There might be a trace apical pneumothorax.  I have independently reviewed that film and I elected to not place another chest tube.  Instead we will repeat her chest x-ray later this afternoon.  She remained clinically stable with oxygen saturations in the 90s on room air.

## 2019-09-15 LAB — CBC WITH DIFFERENTIAL/PLATELET
Abs Immature Granulocytes: 0.06 10*3/uL (ref 0.00–0.07)
Basophils Absolute: 0 10*3/uL (ref 0.0–0.1)
Basophils Relative: 0 %
Eosinophils Absolute: 0.4 10*3/uL (ref 0.0–0.5)
Eosinophils Relative: 3 %
HCT: 32.9 % — ABNORMAL LOW (ref 36.0–46.0)
Hemoglobin: 11.2 g/dL — ABNORMAL LOW (ref 12.0–15.0)
Immature Granulocytes: 1 %
Lymphocytes Relative: 15 %
Lymphs Abs: 1.9 10*3/uL (ref 0.7–4.0)
MCH: 30.7 pg (ref 26.0–34.0)
MCHC: 34 g/dL (ref 30.0–36.0)
MCV: 90.1 fL (ref 80.0–100.0)
Monocytes Absolute: 1.3 10*3/uL — ABNORMAL HIGH (ref 0.1–1.0)
Monocytes Relative: 11 %
Neutro Abs: 8.7 10*3/uL — ABNORMAL HIGH (ref 1.7–7.7)
Neutrophils Relative %: 70 %
Platelets: 251 10*3/uL (ref 150–400)
RBC: 3.65 MIL/uL — ABNORMAL LOW (ref 3.87–5.11)
RDW: 15.9 % — ABNORMAL HIGH (ref 11.5–15.5)
WBC: 12.4 10*3/uL — ABNORMAL HIGH (ref 4.0–10.5)
nRBC: 0 % (ref 0.0–0.2)

## 2019-09-15 LAB — BASIC METABOLIC PANEL
Anion gap: 8 (ref 5–15)
BUN: 29 mg/dL — ABNORMAL HIGH (ref 8–23)
CO2: 29 mmol/L (ref 22–32)
Calcium: 10.6 mg/dL — ABNORMAL HIGH (ref 8.9–10.3)
Chloride: 97 mmol/L — ABNORMAL LOW (ref 98–111)
Creatinine, Ser: 0.86 mg/dL (ref 0.44–1.00)
GFR calc Af Amer: >60 mL/min (ref >60)
GFR calc non Af Amer: 59 mL/min — ABNORMAL LOW (ref >60)
Glucose, Bld: 124 mg/dL — ABNORMAL HIGH (ref 70–99)
Potassium: 4.4 mmol/L (ref 3.5–5.1)
Sodium: 134 mmol/L — ABNORMAL LOW (ref 135–145)

## 2019-09-15 LAB — GLUCOSE, CAPILLARY
Glucose-Capillary: 118 mg/dL — ABNORMAL HIGH (ref 70–99)
Glucose-Capillary: 255 mg/dL — ABNORMAL HIGH (ref 70–99)
Glucose-Capillary: 142 mg/dL — ABNORMAL HIGH (ref 70–99)

## 2019-09-15 LAB — MAGNESIUM: Magnesium: 1.9 mg/dL (ref 1.7–2.4)

## 2019-09-15 NOTE — [place] FL2 (Signed)
[place] MEDICAID FL2 LEVEL OF CARE SCREENING TOOL     IDENTIFICATION  Patient Name: Jenna Robinson Birthdate: 04-11-1925 Sex: female Admission Date (Current Location): 09/12/2019  [place] and [place] Number:  Chiropodist and Address:  [facility], [address], [place], [place] [address]      Provider Number: [id number]  Attending Physician Name and Address:  Lurene Shadow, MD  Relative Name and Phone Number:       Current Level of Care: Hospital Recommended Level of Care: Skilled Nursing Facility Prior Approval Number:    Date Approved/Denied:   PASRR Number: [id number]  Discharge Plan: SNF    Current Diagnoses: Patient Active Problem List   Diagnosis Date Noted  . Pneumothorax   . Goals of care, counseling/discussion   . Palliative care by specialist   . DNR (do not resuscitate)   . Chest tube in place   . Pneumothorax, closed, traumatic 09/12/2019  . Protein-calorie malnutrition, severe 08/23/2019  . Lactic acidosis 08/22/2019  . Sepsis secondary to UTI (HCC) 08/22/2019  . Acute metabolic encephalopathy 08/22/2019  . Inadequate oral intake 08/22/2019  . Dementia without behavioral disturbance (HCC) 08/22/2019  . Diabetes mellitus without complication (HCC) 08/22/2019  . Closed right hip fracture (HCC) 04/08/2019  . Hypercalcemia 05/22/2015  . Elevated troponin 01/02/2015  . Rhabdomyolysis 01/02/2015    Orientation RESPIRATION BLADDER Height & Weight     Self  Normal Incontinent Weight: 112 lb 8 oz (51 kg) Height:  5\' 9"  (175.3 cm)  BEHAVIORAL SYMPTOMS/MOOD NEUROLOGICAL BOWEL NUTRITION STATUS      Incontinent Diet (soft diet, thin liquids)  AMBULATORY STATUS COMMUNICATION OF NEEDS Skin   Extensive Assist Verbally Normal                       Personal Care Assistance Level of Assistance  Bathing, Feeding, Dressing Bathing Assistance: Maximum assistance Feeding assistance: Limited  assistance Dressing Assistance: Maximum assistance     Functional Limitations Info  Sight, Hearing, Speech Sight Info: Adequate Hearing Info: Adequate Speech Info: Adequate    SPECIAL CARE FACTORS FREQUENCY  PT (By licensed PT), OT (By licensed OT)     PT Frequency: 5x OT Frequency: 5x            Contractures Contractures Info: Not present    Additional Factors Info  Code Status, Allergies Code Status Info: DNR Allergies Info: Penicillins           Current Medications (09/15/2019):  This is the current hospital active medication list Current Facility-Administered Medications  Medication Dose Route Frequency Provider Last Rate Last Admin  . acetaminophen (TYLENOL) tablet 650 mg  650 mg Oral Q6H PRN 11/15/2019, MD   650 mg at 09/13/19 2156   Or  . acetaminophen (TYLENOL) suppository 650 mg  650 mg Rectal Q6H PRN 2157, MD      . alum & mag hydroxide-simeth (MAALOX/MYLANTA) 200-200-20 MG/5ML suspension 30 mL  30 mL Oral Q6H PRN 07-21-2000, MD      . atenolol (TENORMIN) tablet 25 mg  25 mg Oral Daily Cipriano Bunker, MD   25 mg at 09/15/19 11/15/19  . docusate sodium (COLACE) capsule 100 mg  100 mg Oral BID 7846, MD   100 mg at 09/14/19 2304  . feeding supplement (ENSURE ENLIVE) (ENSURE ENLIVE) liquid 237 mL  237 mL Oral TID BM 2305, MD   237 mL at 09/15/19 0916  . hydrALAZINE (APRESOLINE) injection  5 mg  5 mg Intravenous Q6H PRN Cipriano Bunker, MD   5 mg at 09/12/19 2013  . insulin aspart (novoLOG) injection 0-9 Units  0-9 Units Subcutaneous TID WC Cipriano Bunker, MD   5 Units at 09/15/19 1128  . multivitamin with minerals tablet 1 tablet  1 tablet Oral Daily Lurene Shadow, MD   1 tablet at 09/15/19 0916  . ondansetron (ZOFRAN) tablet 4 mg  4 mg Oral Q6H PRN Cipriano Bunker, MD       Or  . ondansetron [facility]) injection 4 mg  4 mg Intravenous Q6H PRN Cipriano Bunker, MD      . oxybutynin (DITROPAN) tablet 5 mg  5 mg Oral BID Cipriano Bunker, MD    5 mg at 09/15/19 0916  . senna (SENOKOT) tablet 8.6 mg  1 tablet Oral BID Cipriano Bunker, MD   8.6 mg at 09/15/19 0916  . traMADol (ULTRAM) tablet 50 mg  50 mg Oral Q6H PRN Jimmye Norman, NP   50 mg at 09/15/19 4403     Discharge Medications: Please see discharge summary for a list of discharge medications.  Relevant Imaging Results:  Relevant Lab Results:   Additional Information SSN:[id number]  Reuel Boom Tekela Garguilo, LCSW

## 2019-09-15 NOTE — TOC Progression Note (Signed)
Transition of Care [facility]) - Progression Note    Patient Details  Name: Jenna Robinson MRN: [id number] Date of Birth: Jun 20, 1925  Transition of [facility]) CM/SW Contact  Palmer Cellar, RN Phone Number: 09/15/2019, 8:50 AM  Clinical Narrative:     Received call from Tammy Mckinney-[phone] @ [facility] states she is planning to talk with her administrator however she is doubtful patient can return to [facility] due to fall risk. Discussed family not interested in SNF placement and adamant patient return with [facility]. Tammy requested hospital staff not discuss inability to return to [facility] with family until they have had a chance to talk with family first. [facility] RN CM agreed and will contact Tammy later in the morning for confirmation.         Expected Discharge Plan and Services                                                 Social Determinants of Health (SDOH) Interventions    Readmission Risk Interventions No flowsheet data found.

## 2019-09-15 NOTE — Progress Notes (Signed)
Daily Progress Note   Patient Name: Jenna Robinson       Date: 09/15/2019 DOB: January 02, 1926  Age: 86 y.o. MRN#: [id number] Attending Physician: Lurene Shadow, MD Primary Care Physician: Mickel Fuchs, MD Admit Date: 09/12/2019  Reason for Consultation/Follow-up: Establishing goals of care  Subjective: Patient more interactive today, remains confused, denies discomfort  Length of Stay: 3  Current Medications: Scheduled Meds:  . atenolol  25 mg Oral Daily  . docusate sodium  100 mg Oral BID  . feeding supplement (ENSURE ENLIVE)  237 mL Oral TID BM  . insulin aspart  0-9 Units Subcutaneous TID WC  . multivitamin with minerals  1 tablet Oral Daily  . oxybutynin  5 mg Oral BID  . senna  1 tablet Oral BID    Continuous Infusions:   PRN Meds: acetaminophen **OR** acetaminophen, alum & mag hydroxide-simeth, hydrALAZINE, ondansetron **OR** ondansetron (ZOFRAN) IV, traMADol  Physical Exam Constitutional:      General: She is not in acute distress. Pulmonary:     Effort: Pulmonary effort is normal. No respiratory distress.  Musculoskeletal:     Right lower leg: No edema.     Left lower leg: No edema.  Skin:    General: Skin is warm and dry.  Neurological:     Mental Status: She is alert. She is disoriented.             Vital Signs: BP (!) 124/59 (BP Location: Left Arm)   Pulse 79   Temp 98.4 F (36.9 C)   Resp 18   Ht 5\' 9"  (1.753 m)   Wt 51 kg   SpO2 99%   BMI 16.61 kg/m  SpO2: SpO2: 99 % O2 Device: O2 Device: Room Air O2 Flow Rate: O2 Flow Rate (L/min): 2.5 L/min  Intake/output summary:   Intake/Output Summary (Last 24 hours) at 09/15/2019 1549 Last data filed at 09/15/2019 1345 Gross per 24 hour  Intake 840 ml  Output 0 ml  Net 840 ml   LBM: Last BM Date:   (UTA) Baseline Weight: Weight: 53 kg Most recent weight: Weight: 51 kg       Palliative Assessment/Data: PPS 50%    Flowsheet Rows     Most Recent Value  Intake Tab  Referral Department Hospitalist  Unit at Time of Referral Cardiac/Telemetry Unit  Palliative Care Primary Diagnosis Trauma  Date Notified 09/13/19  Palliative Care Type New Palliative care  Reason for referral Counsel Regarding Hospice  Date of Admission 09/11/19  Date first seen by Palliative Care 09/14/19  # of days Palliative referral response time 1 Day(s)  # of days IP prior to Palliative referral 2  Clinical Assessment  Palliative Performance Scale Score 50%  Psychosocial & Spiritual Assessment  Palliative Care Outcomes  Patient/Family meeting held? Yes  Who was at the meeting? legal guardian  Palliative Care Outcomes Clarified goals of care, Counseled regarding hospice, Transitioned to hospice      Patient Active Problem List   Diagnosis Date Noted  . Goals of care, counseling/discussion   . Palliative care by specialist   . DNR (do not resuscitate)   . Chest tube in place   . Pneumothorax, closed, traumatic  09/12/2019  . Protein-calorie malnutrition, severe 08/23/2019  . Lactic acidosis 08/22/2019  . Sepsis secondary to UTI (HCC) 08/22/2019  . Acute metabolic encephalopathy 08/22/2019  . Inadequate oral intake 08/22/2019  . Dementia without behavioral disturbance (HCC) 08/22/2019  . Diabetes mellitus without complication (HCC) 08/22/2019  . Closed right hip fracture (HCC) 04/08/2019  . Hypercalcemia 05/22/2015  . Elevated troponin 01/02/2015  . Rhabdomyolysis 01/02/2015    Palliative Care Assessment & Plan   HPI: 86 y.o. female  with past medical history of dementia, falls, T2DM, and HTN admitted on 09/12/2019 with fall and R side pneumothorax requiring chest tube. PMT consulted as patient's niece expressed interest in hospice services upon discharge.    Assessment: Patient more  interactive today. Comfortable. No s/s distress.   Per TOC, patient unable to return to ALF. Concern about disposition. TOC and ALF to speak with legal guardian.   Recommendations/Plan:  If patient d/c to rehab - recommend palliative to follow - discharging MD please write in discharge plan  If d/c to ALF or long-term care - hospice to follow - please write in discharge  Code Status:  DNR  Prognosis:   < 6 months  Discharge Planning:  To Be Determined  Care plan was discussed with social work  Thank you for allowing the Palliative Medicine Team to assist in the care of this patient.   Total Time 15 minutes Prolonged Time Billed  no       Greater than 50%  of this time was spent counseling and coordinating care related to the above assessment and plan.  Gerlean Ren, DNP, [facility] Palliative Medicine Team Team Phone # [phone]  Pager [phone]

## 2019-09-15 NOTE — Progress Notes (Signed)
Report called to Danella LPN at [facility]. IV and tele box out. Niece aware of discharge. Pt awaiting [facility] for transport. Per EMS pt next on the list. Will pass on to night nurse.

## 2019-09-15 NOTE — TOC Transition Note (Signed)
Transition of Care [facility]) - CM/SW Discharge Note   Patient Details  Name: Jenna Robinson MRN: [id number] Date of Birth: 05/21/1925  Transition of [facility]) CM/SW Contact:  Maree Krabbe, LCSW Phone Number: 09/15/2019, 3:49 PM   Clinical Narrative:   Clinical Social Worker facilitated patient discharge including contacting patient family and facility to confirm patient discharge plans.  Clinical information faxed to facility and family agreeable with plan.  CSW arranged ambulance transport via [facility] to [facility]. RN to call [phone] for report prior to discharge.    Final next level of care: Skilled Nursing Facility Barriers to Discharge: No Barriers Identified   Patient Goals and CMS Choice        Discharge Placement              Patient chooses bed at: [facility] Patient to be transferred to facility by: [facility] Name of family member notified: Neice Patient and family notified of of transfer: 09/15/19  Discharge Plan and Services                                     Social Determinants of Health (SDOH) Interventions     Readmission Risk Interventions No flowsheet data found.

## 2019-09-15 NOTE — Progress Notes (Signed)
EMS in to pick up pt to transport to [facility]. Niece notified. Pt discharged.

## 2019-09-15 NOTE — Care Management Important Message (Signed)
Important Message  Patient Details  Name: Jenna Robinson MRN: [id number] Date of Birth: [birth year]   Medicare Important Message Given:  Yes     Jenna Robinson 09/15/2019, 2:03 PM

## 2019-09-15 NOTE — Discharge Summary (Addendum)
Physician Discharge Summary  Jenna Robinson DUK:[id number] DOB: 1925-05-21 DOA: 09/12/2019  PCP: Mickel Fuchs, MD  Admit date: 09/12/2019 Discharge date: 09/15/2019  Discharge disposition: Assisted living facility   Recommendations for Outpatient Follow-Up:   Follow-up with PCP in 1 week if desired Follow-up with hospice team at the assisted living facility   Discharge Diagnosis:   Principal Problem:   Pneumothorax, closed, traumatic Active Problems:   Goals of care, counseling/discussion   Palliative care by specialist   DNR (do not resuscitate)   Chest tube in place    Discharge Condition: Stable.  Diet recommendation:  Diet Order            Diet general           DIET SOFT Room service appropriate? Yes; Fluid consistency: Thin  Diet effective now                   Code Status: DNR     Hospital Course:   Jenna Robinson is a 86 y.o. female with medical history significant of Alzheimer's dementia, hard of hearing, diabetes mellitus, hypertension, hypothyroidism presented in the emergency department from [facility] assisted living status post fall.  Apparently, she has been having multiple falls at the facility lately.  Apparently, she fell when she got out of bed and she hit the [employer].  She was found to have moderate sized right pneumothorax, multiple displaced right rib (third, fourth and fifth) fractures and small right scalp hematoma.  She was treated with analgesics.  A chest tube was placed in the right chest wall to manage the pneumothorax.  She was seen in consultation by the CT surgeon, Dr. Thelma Barge.  Pneumothorax has improved and chest tube has been removed.  She is tolerating room air.  From CT surgeon's standpoint, she is okay for discharge.  She also had hypokalemia and hypercalcemia that have improved.  She is deemed stable for discharge to the assisted living facility.  According to [name] (niece and legal guardian), patient had  already been enrolled in hospice at ALF. I called [name] today at 2:10 pm, but there was no response.   Medical Consultants:   CT surgeon, Dr. Thelma Barge  Discharge Exam:    Vitals:   09/15/19 0318 09/15/19 0830 09/15/19 0950 09/15/19 1117  BP: (!) 146/71 138/89  (!) 124/59  Pulse: (!) 59 66 74 79  Resp: 16 16  18   Temp: 98.2 F (36.8 C) 98.3 F (36.8 C)  98.4 F (36.9 C)  TempSrc: Oral Oral    SpO2: 100% 100% 98% 99%  Weight: 51 kg     Height:         GEN: NAD SKIN: Multiple bruises, ecchymosis, and skin tears on bilateral upper extremities.  Dressing on right chest wall (chest tube site) is intact. EYES: EOMI ENT: MMM CV: RRR PULM: CTA B ABD: soft, ND, NT, +BS CNS: AAO x 1 (person), non focal EXT: No edema or tenderness   The results of significant diagnostics from this hospitalization (including imaging, microbiology, ancillary and laboratory) are listed below for reference.     Procedures and Diagnostic Studies:   DG Chest 1 View  Result Date: 09/12/2019 CLINICAL DATA:  Status post fall with pneumothorax seen on neck CT. EXAM: CHEST  1 VIEW COMPARISON:  August 21, 2019 FINDINGS: The lungs are hyperinflated. There is no evidence of acute infiltrate or pleural effusion. A moderate size right-sided pneumothorax is seen along the right apex and lateral  aspect of the mid and upper right lung. The heart size and mediastinal contours are within normal limits. There is marked severity calcification of the aortic arch. The visualized skeletal structures are unremarkable. IMPRESSION: 1. Moderate size right-sided pneumothorax. 2. Findings consistent with COPD. 3. Aortic atherosclerosis. 4. No acute infiltrate. Electronically Signed   By: Aram Candelahaddeus  Houston M.D.   On: 09/12/2019 16:35   DG Elbow 2 Views Right  Result Date: 09/12/2019 CLINICAL DATA:  Fall with trauma to the elbow. EXAM: RIGHT ELBOW - 2 VIEW COMPARISON:  None. FINDINGS: There is no evidence of fracture, dislocation, or  joint effusion. There is no evidence of arthropathy or other focal bone abnormality. Soft tissues are unremarkable. IMPRESSION: Negative. Electronically Signed   By: Paulina FusiMark  Shogry M.D.   On: 09/12/2019 15:05   CT Head Wo Contrast  Addendum Date: 09/12/2019   ADDENDUM REPORT: 09/12/2019 15:24 ADDENDUM: These results were called by telephone at the time of interpretation on 09/12/2019 at 3:23 pm to provider Dorothea GlassmanPAUL MALINDA , who verbally acknowledged these results. Electronically Signed   By: Helyn NumbersAshesh  Parikh MD   On: 09/12/2019 15:24   Result Date: 09/12/2019 CLINICAL DATA:  Multiple falls, head injury, scalp hematoma EXAM: CT HEAD WITHOUT CONTRAST CT CERVICAL SPINE WITHOUT CONTRAST TECHNIQUE: Multidetector CT imaging of the head and cervical spine was performed following the standard protocol without intravenous contrast. Multiplanar CT image reconstructions of the cervical spine were also generated. COMPARISON:  None. FINDINGS: CT HEAD FINDINGS Brain: Moderate parenchymal volume loss is commensurate with the patient's age. Moderate periventricular and subcortical white matter changes are present likely reflecting the sequela of small vessel ischemia. Remote lacunar infarct noted within the left basal ganglia no evidence of acute intracranial hemorrhage or infarct. No abnormal mass effect or midline shift. No abnormal intra or extra-axial mass lesion. Ventricular size is normal and commensurate with the degree of volume loss. Cerebellum is unremarkable. Vascular: No asymmetric hyperdense vasculature at the skull base. Skull: Intact Sinuses/Orbits: Next small air-fluid level noted within the left sphenoid sinus. Small mucous retention cyst noted within the right sphenoid sinus. Remaining paranasal sinuses are unremarkable. Orbits are unremarkable. Other: Mastoid air cells and middle ear cavities are clear. Small right temporal scalp hematoma is present with associated punctate subcutaneous gas in keeping with history  of local trauma. CT CERVICAL SPINE FINDINGS Alignment: There is reversal of the normal cervical lordosis without evidence of focal angulation or listhesis. Skull base and vertebrae: The craniocervical junction is unremarkable. Atlantodental interval is normal. There is no acute fracture of the cervical spine. No lytic or blastic bone lesions are seen. Soft tissues and spinal canal: No canal hematoma identified. The paraspinal soft tissues are unremarkable. Disc levels: Review of the sagittal reformats demonstrates severe intervertebral disc space narrowing and endplate remodeling at C4-5, C5-6, and C6-7 in keeping with changes of defense degenerative disc disease. Milder degenerative changes are noted at C3-4. Advanced degenerative changes are seen at the C1-2 articulation involving the left lateral mass and atlantodental articulation. Vertebral body height has been preserved. Review of the axial images demonstrates multilevel uncovertebral and facet arthrosis resulting in multilevel neural foraminal narrowing, most severe on the left at C4-5, bilaterally at C5-6, bilaterally at C6-7 and, to a milder extent, on the left at C3-4 and on the right at C4-5. There is moderate central canal stenosis at C4-5, C5-6, and C6-7 secondary to posterior disc osteophyte complexes which result in flattening of the thecal sac at these levels. Upper chest:  Tiny right apical pneumothorax is present Other: None significant IMPRESSION: 1. Small right temporal scalp hematoma with associated punctate subcutaneous gas in keeping with history of local trauma. 2. No acute intracranial abnormality. 3. No acute fracture or malalignment of the cervical spine. 4. Advanced degenerative disc disease and facet arthrosis throughout the cervical spine, most severe at C4-5, C5-6, and C6-7 where there is moderate central canal stenosis and multilevel neural foraminal narrowing. 5. Tiny right apical pneumothorax. Electronically Signed: By: Helyn Numbers MD On: 09/12/2019 15:14   CT Cervical Spine Wo Contrast  Addendum Date: 09/12/2019   ADDENDUM REPORT: 09/12/2019 15:24 ADDENDUM: These results were called by telephone at the time of interpretation on 09/12/2019 at 3:23 pm to provider Dorothea Glassman , who verbally acknowledged these results. Electronically Signed   By: Helyn Numbers MD   On: 09/12/2019 15:24   Result Date: 09/12/2019 CLINICAL DATA:  Multiple falls, head injury, scalp hematoma EXAM: CT HEAD WITHOUT CONTRAST CT CERVICAL SPINE WITHOUT CONTRAST TECHNIQUE: Multidetector CT imaging of the head and cervical spine was performed following the standard protocol without intravenous contrast. Multiplanar CT image reconstructions of the cervical spine were also generated. COMPARISON:  None. FINDINGS: CT HEAD FINDINGS Brain: Moderate parenchymal volume loss is commensurate with the patient's age. Moderate periventricular and subcortical white matter changes are present likely reflecting the sequela of small vessel ischemia. Remote lacunar infarct noted within the left basal ganglia no evidence of acute intracranial hemorrhage or infarct. No abnormal mass effect or midline shift. No abnormal intra or extra-axial mass lesion. Ventricular size is normal and commensurate with the degree of volume loss. Cerebellum is unremarkable. Vascular: No asymmetric hyperdense vasculature at the skull base. Skull: Intact Sinuses/Orbits: Next small air-fluid level noted within the left sphenoid sinus. Small mucous retention cyst noted within the right sphenoid sinus. Remaining paranasal sinuses are unremarkable. Orbits are unremarkable. Other: Mastoid air cells and middle ear cavities are clear. Small right temporal scalp hematoma is present with associated punctate subcutaneous gas in keeping with history of local trauma. CT CERVICAL SPINE FINDINGS Alignment: There is reversal of the normal cervical lordosis without evidence of focal angulation or listhesis. Skull base  and vertebrae: The craniocervical junction is unremarkable. Atlantodental interval is normal. There is no acute fracture of the cervical spine. No lytic or blastic bone lesions are seen. Soft tissues and spinal canal: No canal hematoma identified. The paraspinal soft tissues are unremarkable. Disc levels: Review of the sagittal reformats demonstrates severe intervertebral disc space narrowing and endplate remodeling at C4-5, C5-6, and C6-7 in keeping with changes of defense degenerative disc disease. Milder degenerative changes are noted at C3-4. Advanced degenerative changes are seen at the C1-2 articulation involving the left lateral mass and atlantodental articulation. Vertebral body height has been preserved. Review of the axial images demonstrates multilevel uncovertebral and facet arthrosis resulting in multilevel neural foraminal narrowing, most severe on the left at C4-5, bilaterally at C5-6, bilaterally at C6-7 and, to a milder extent, on the left at C3-4 and on the right at C4-5. There is moderate central canal stenosis at C4-5, C5-6, and C6-7 secondary to posterior disc osteophyte complexes which result in flattening of the thecal sac at these levels. Upper chest: Tiny right apical pneumothorax is present Other: None significant IMPRESSION: 1. Small right temporal scalp hematoma with associated punctate subcutaneous gas in keeping with history of local trauma. 2. No acute intracranial abnormality. 3. No acute fracture or malalignment of the cervical spine. 4. Advanced degenerative  disc disease and facet arthrosis throughout the cervical spine, most severe at C4-5, C5-6, and C6-7 where there is moderate central canal stenosis and multilevel neural foraminal narrowing. 5. Tiny right apical pneumothorax. Electronically Signed: By: Helyn Numbers MD On: 09/12/2019 15:14   DG Chest Port 1 View  Result Date: 09/13/2019 CLINICAL DATA:  RIGHT chest tube, RIGHT closed pneumothorax post trauma, diabetes  mellitus, hypertension, Alzheimer's, EXAM: PORTABLE CHEST 1 VIEW COMPARISON:  Portable exam 1001 hours compared 09/12/2019 FINDINGS: Small caliber RIGHT thoracostomy tube unchanged. Normal heart size, mediastinal contours, and pulmonary vascularity. Emphysematous and bronchitic changes consistent with COPD. Minimal RIGHT apex pneumothorax, decreased Atherosclerotic calcification aorta. Bones demineralized with multiple lateral RIGHT rib fractures. IMPRESSION: Decreased RIGHT apical pneumothorax. No new abnormalities. Electronically Signed   By: Ulyses Southward M.D.   On: 09/13/2019 10:25   DG Chest Portable 1 View  Result Date: 09/12/2019 CLINICAL DATA:  Follow-up pneumothorax EXAM: PORTABLE CHEST 1 VIEW COMPARISON:  Earlier same day FINDINGS: Right chest tube is in place. Marked reduction in right pneumothorax. Only a tiny amount of persistent pleural air. Fracture of the right lateral third, fourth and fifth ribs. Left chest remains clear. Normal heart size. Aortic atherosclerosis as seen previously. IMPRESSION: Marked reduction in right pneumothorax with only a tiny amount of persistent pleural air. Right lateral third, fourth and fifth rib fractures. Electronically Signed   By: Paulina Fusi M.D.   On: 09/12/2019 19:18     Labs:   Basic Metabolic Panel: Recent Labs  Lab 09/12/19 1647 09/12/19 1647 09/13/19 0409 09/13/19 0409 09/13/19 0449 09/14/19 0428 09/15/19 0548  NA 135  --  136  --   --  137 134*  K 3.4*   < > 3.5   < >  --  3.4* 4.4  CL 93*  --  98  --   --  98 97*  CO2 30  --  28  --   --  30 29  GLUCOSE 175*  --  161*  --   --  105* 124*  BUN 30*  --  26*  --   --  24* 29*  CREATININE 1.07*  --  0.88  --   --  0.75 0.86  CALCIUM 11.7*  --  11.1*  --   --  11.1* 10.6*  MG  --   --   --   --  1.9  --  1.9   < > = values in this interval not displayed.   GFR Estimated Creatinine Clearance: 34.3 mL/min (by C-G formula based on SCr of 0.86 mg/dL). Liver Function Tests: Recent Labs   Lab 09/13/19 0409  AST 47*  ALT 38  ALKPHOS 82  BILITOT 1.1  PROT 7.1  ALBUMIN 3.9   No results for input(s): LIPASE, AMYLASE in the last 168 hours. No results for input(s): AMMONIA in the last 168 hours. Coagulation profile Recent Labs  Lab 09/13/19 0409  INR 1.0    CBC: Recent Labs  Lab 09/12/19 1647 09/13/19 0409 09/14/19 0428 09/15/19 0548  WBC 21.6* 14.3* 11.8* 12.4*  NEUTROABS 18.7*  --  9.1* 8.7*  HGB 12.3 11.6* 11.9* 11.2*  HCT 37.8 34.5* 35.1* 32.9*  MCV 91.7 89.6 88.9 90.1  PLT 296 257 255 251   Cardiac Enzymes: No results for input(s): CKTOTAL, CKMB, CKMBINDEX, TROPONINI in the last 168 hours. BNP: Invalid input(s): POCBNP CBG: Recent Labs  Lab 09/14/19 1139 09/14/19 1603 09/14/19 2038 09/15/19 0831 09/15/19 1118  GLUCAP 280*  71 147* 118* 255*   D-Dimer No results for input(s): DDIMER in the last 72 hours. Hgb A1c No results for input(s): HGBA1C in the last 72 hours. Lipid Profile No results for input(s): CHOL, HDL, LDLCALC, TRIG, CHOLHDL, LDLDIRECT in the last 72 hours. Thyroid function studies No results for input(s): TSH, T4TOTAL, T3FREE, THYROIDAB in the last 72 hours.  Invalid input(s): FREET3 Anemia work up No results for input(s): VITAMINB12, FOLATE, FERRITIN, TIBC, IRON, RETICCTPCT in the last 72 hours. Microbiology Recent Results (from the past 240 hour(s))  SARS Coronavirus 2 by RT PCR (hospital order, performed in [facility] hospital lab) Nasopharyngeal Nasopharyngeal Swab     Status: None   Collection Time: 09/12/19  4:47 PM   Specimen: Nasopharyngeal Swab  Result Value Ref Range Status   SARS Coronavirus 2 NEGATIVE NEGATIVE Final    Comment: (NOTE) SARS-CoV-2 target nucleic acids are NOT DETECTED.  The SARS-CoV-2 RNA is generally detectable in upper and lower respiratory specimens during the acute phase of infection. The lowest concentration of SARS-CoV-2 viral copies this assay can detect is 250 copies / mL. A negative  result does not preclude SARS-CoV-2 infection and should not be used as the sole basis for treatment or other patient management decisions.  A negative result may occur with improper specimen collection / handling, submission of specimen other than nasopharyngeal swab, presence of viral mutation(s) within the areas targeted by this assay, and inadequate number of viral copies (<250 copies / mL). A negative result must be combined with clinical observations, patient history, and epidemiological information.  Fact Sheet for Patients:   BoilerBrush.com.cy  Fact Sheet for Healthcare Providers: https://pope.com/  This test is not yet approved or  cleared by the [place] FDA and has been authorized for detection and/or diagnosis of SARS-CoV-2 by FDA under an Emergency Use Authorization (EUA).  This EUA will remain in effect (meaning this test can be used) for the duration of the COVID-19 declaration under Section 564(b)(1) of the Act, 21 U.S.C. section 360bbb-3(b)(1), unless the authorization is terminated or revoked sooner.  Performed at [facility], [address] [place], [place] [address]   Urine culture     Status: Abnormal   Collection Time: 09/12/19  6:18 PM   Specimen: Urine, Random  Result Value Ref Range Status   Specimen Description   Final    URINE, RANDOM Performed at [facility], [address] [place], [place] [address]    Special Requests   Final    NONE Performed at [facility], [address] [place], [place] [address]    Culture MULTIPLE SPECIES PRESENT, SUGGEST RECOLLECTION (A)  Final   Report Status 09/13/2019 FINAL  Final     Discharge Instructions:   Discharge Instructions    Diet general   Complete by: As directed    Discharge instructions   Complete by: As directed    Follow up with hospice team   Increase activity slowly   Complete by: As directed       Allergies as of 09/15/2019      Reactions   Penicillins Itching   Did it involve swelling of the face/tongue/throat, SOB, or low BP? No Did it involve sudden or severe rash/hives, skin peeling, or any reaction on the inside of your mouth or nose? Yes Did you need to seek medical attention at a hospital or doctor's office? No When did it last happen? If all above answers are "NO", may proceed with cephalosporin  use.      Medication List    STOP taking these medications   atorvastatin 20 MG tablet Commonly known as: LIPITOR   enoxaparin 40 MG/0.4ML injection Commonly known as: LOVENOX   glipiZIDE 5 MG tablet Commonly known as: GLUCOTROL   Robitussin Cold Cough+ Chest 10-100 MG/5ML liquid Generic drug: dextromethorphan-guaiFENesin   zinc oxide 20 % ointment     TAKE these medications   acetaminophen 325 MG tablet Commonly known as: TYLENOL Take 650 mg by mouth every 6 (six) hours as needed for mild pain, moderate pain, fever or headache.   alendronate 70 MG tablet Commonly known as: FOSAMAX Take 1 tablet by mouth once a week. With 8 oz of water at least 30 minutes before food/meds. Do not lie down for 30 minutes after dose.   atenolol 25 MG tablet Commonly known as: TENORMIN Take 25 mg by mouth daily.   feeding supplement Liqd Take 1 Container by mouth 2 (two) times daily between meals.   morphine 10 MG/5ML solution Take 10 mg by mouth every 2 (two) hours as needed for moderate pain or severe pain.   Mylanta 200-200-20 MG/5ML suspension Generic drug: alum & mag hydroxide-simeth Take 30 mLs by mouth every 6 (six) hours as needed for indigestion or heartburn.   oxybutynin 5 MG tablet Commonly known as: DITROPAN Take 5 mg by mouth 2 (two) times daily.   Pepto-Bismol 262 MG/15ML suspension Generic drug: bismuth subsalicylate Take 15 mLs by mouth every 6 (six) hours as needed.       Contact information for after-discharge care    Destination     [facility] Preferred SNF .   Service: Skilled Nursing Contact information: [address] [place] [address] [phone]                   Time coordinating discharge: 32 minutes  Signed:  Lurene Shadow  [facility] 09/15/2019, 3:45 PM   Pager on [email or web address]. If 7PM-7AM, please contact night-coverage at [email or web address]

## 2019-09-19 ENCOUNTER — Emergency Department: Payer: Medicare Other

## 2019-09-19 ENCOUNTER — Inpatient Hospital Stay
Admission: EM | Admit: 2019-09-19 | Discharge: 2019-09-24 | DRG: 689 | Disposition: A | Discharge status: Skilled Nursing Facility | Payer: Medicare Other | Source: Skilled Nursing Facility | Attending: Internal Medicine | Admitting: Internal Medicine

## 2019-09-19 ENCOUNTER — Other Ambulatory Visit: Payer: Self-pay

## 2019-09-19 ENCOUNTER — Encounter: Payer: Self-pay | Admitting: Emergency Medicine

## 2019-09-19 DIAGNOSIS — E43 Unspecified severe protein-calorie malnutrition: Secondary | ICD-10-CM | POA: Diagnosis present

## 2019-09-19 DIAGNOSIS — R52 Pain, unspecified: Secondary | ICD-10-CM

## 2019-09-19 DIAGNOSIS — J939 Pneumothorax, unspecified: Secondary | ICD-10-CM

## 2019-09-19 DIAGNOSIS — W19XXXA Unspecified fall, initial encounter: Secondary | ICD-10-CM | POA: Diagnosis present

## 2019-09-19 DIAGNOSIS — I10 Essential (primary) hypertension: Secondary | ICD-10-CM | POA: Diagnosis present

## 2019-09-19 DIAGNOSIS — N390 Urinary tract infection, site not specified: Secondary | ICD-10-CM | POA: Diagnosis not present

## 2019-09-19 DIAGNOSIS — M25511 Pain in right shoulder: Secondary | ICD-10-CM | POA: Diagnosis present

## 2019-09-19 DIAGNOSIS — J90 Pleural effusion, not elsewhere classified: Secondary | ICD-10-CM | POA: Diagnosis present

## 2019-09-19 DIAGNOSIS — Z880 Allergy status to penicillin: Secondary | ICD-10-CM

## 2019-09-19 DIAGNOSIS — Z9181 History of falling: Secondary | ICD-10-CM

## 2019-09-19 DIAGNOSIS — Z66 Do not resuscitate: Secondary | ICD-10-CM | POA: Diagnosis present

## 2019-09-19 DIAGNOSIS — S0003XA Contusion of scalp, initial encounter: Secondary | ICD-10-CM | POA: Diagnosis present

## 2019-09-19 DIAGNOSIS — F0390 Unspecified dementia without behavioral disturbance: Secondary | ICD-10-CM | POA: Diagnosis present

## 2019-09-19 DIAGNOSIS — Z90710 Acquired absence of both cervix and uterus: Secondary | ICD-10-CM

## 2019-09-19 DIAGNOSIS — R7989 Other specified abnormal findings of blood chemistry: Secondary | ICD-10-CM

## 2019-09-19 DIAGNOSIS — G309 Alzheimer's disease, unspecified: Secondary | ICD-10-CM | POA: Diagnosis present

## 2019-09-19 DIAGNOSIS — E871 Hypo-osmolality and hyponatremia: Secondary | ICD-10-CM | POA: Diagnosis present

## 2019-09-19 DIAGNOSIS — E119 Type 2 diabetes mellitus without complications: Secondary | ICD-10-CM

## 2019-09-19 DIAGNOSIS — S270XXA Traumatic pneumothorax, initial encounter: Secondary | ICD-10-CM | POA: Diagnosis present

## 2019-09-19 DIAGNOSIS — Z20822 Contact with and (suspected) exposure to covid-19: Secondary | ICD-10-CM | POA: Diagnosis present

## 2019-09-19 DIAGNOSIS — Z79899 Other long term (current) drug therapy: Secondary | ICD-10-CM

## 2019-09-19 DIAGNOSIS — B952 Enterococcus as the cause of diseases classified elsewhere: Secondary | ICD-10-CM | POA: Diagnosis present

## 2019-09-19 DIAGNOSIS — D649 Anemia, unspecified: Secondary | ICD-10-CM | POA: Diagnosis present

## 2019-09-19 DIAGNOSIS — Y92099 Unspecified place in other non-institutional residence as the place of occurrence of the external cause: Secondary | ICD-10-CM

## 2019-09-19 DIAGNOSIS — E0590 Thyrotoxicosis, unspecified without thyrotoxic crisis or storm: Secondary | ICD-10-CM | POA: Diagnosis present

## 2019-09-19 DIAGNOSIS — Z87440 Personal history of urinary (tract) infections: Secondary | ICD-10-CM

## 2019-09-19 DIAGNOSIS — S2241XA Multiple fractures of ribs, right side, initial encounter for closed fracture: Secondary | ICD-10-CM | POA: Diagnosis present

## 2019-09-19 DIAGNOSIS — R945 Abnormal results of liver function studies: Secondary | ICD-10-CM | POA: Diagnosis present

## 2019-09-19 DIAGNOSIS — Z681 Body mass index (BMI) 19 or less, adult: Secondary | ICD-10-CM

## 2019-09-19 DIAGNOSIS — R109 Unspecified abdominal pain: Secondary | ICD-10-CM | POA: Diagnosis not present

## 2019-09-19 DIAGNOSIS — F0280 Dementia in other diseases classified elsewhere without behavioral disturbance: Secondary | ICD-10-CM | POA: Diagnosis present

## 2019-09-19 DIAGNOSIS — E7800 Pure hypercholesterolemia, unspecified: Secondary | ICD-10-CM | POA: Diagnosis present

## 2019-09-19 LAB — CBC WITH DIFFERENTIAL/PLATELET
Abs Immature Granulocytes: 0.12 10*3/uL — ABNORMAL HIGH (ref 0.00–0.07)
Basophils Absolute: 0 10*3/uL (ref 0.0–0.1)
Basophils Relative: 0 %
Eosinophils Absolute: 0.1 10*3/uL (ref 0.0–0.5)
Eosinophils Relative: 1 %
HCT: 33 % — ABNORMAL LOW (ref 36.0–46.0)
Hemoglobin: 11.1 g/dL — ABNORMAL LOW (ref 12.0–15.0)
Immature Granulocytes: 1 %
Lymphocytes Relative: 7 %
Lymphs Abs: 1.1 10*3/uL (ref 0.7–4.0)
MCH: 30.7 pg (ref 26.0–34.0)
MCHC: 33.6 g/dL (ref 30.0–36.0)
MCV: 91.4 fL (ref 80.0–100.0)
Monocytes Absolute: 1.6 10*3/uL — ABNORMAL HIGH (ref 0.1–1.0)
Monocytes Relative: 9 %
Neutro Abs: 14.2 10*3/uL — ABNORMAL HIGH (ref 1.7–7.7)
Neutrophils Relative %: 82 %
Platelets: 341 10*3/uL (ref 150–400)
RBC: 3.61 MIL/uL — ABNORMAL LOW (ref 3.87–5.11)
RDW: 16 % — ABNORMAL HIGH (ref 11.5–15.5)
WBC: 17.1 10*3/uL — ABNORMAL HIGH (ref 4.0–10.5)
nRBC: 0 % (ref 0.0–0.2)

## 2019-09-19 LAB — URINALYSIS, COMPLETE (UACMP) WITH MICROSCOPIC
Bilirubin Urine: NEGATIVE
Glucose, UA: >=500 mg/dL — AB
Ketones, ur: NEGATIVE mg/dL
Nitrite: NEGATIVE
Protein, ur: NEGATIVE mg/dL
RBC / HPF: >50 RBC/hpf — ABNORMAL HIGH (ref 0–5)
Specific Gravity, Urine: 1.017 (ref 1.005–1.030)
Squamous Epithelial / HPF: NONE SEEN (ref 0–5)
pH: 6 (ref 5.0–8.0)

## 2019-09-19 LAB — BASIC METABOLIC PANEL
Anion gap: 7 (ref 5–15)
BUN: 21 mg/dL (ref 8–23)
CO2: 28 mmol/L (ref 22–32)
Calcium: 10.1 mg/dL (ref 8.9–10.3)
Chloride: 99 mmol/L (ref 98–111)
Creatinine, Ser: 0.74 mg/dL (ref 0.44–1.00)
GFR calc Af Amer: >60 mL/min (ref >60)
GFR calc non Af Amer: >60 mL/min (ref >60)
Glucose, Bld: 216 mg/dL — ABNORMAL HIGH (ref 70–99)
Potassium: 4.2 mmol/L (ref 3.5–5.1)
Sodium: 134 mmol/L — ABNORMAL LOW (ref 135–145)

## 2019-09-19 NOTE — ED Triage Notes (Addendum)
Pt to triage via w/c with no distress noted; purplish bruising noted to rt side forehead, guaze dressings in place to FA(s); pt seen recently for fall with documentation of skin tears to FA(s) and hematoma to rt side forehead; pt alert to self only; pt brought in by EMS from [facility] for unwitnessed fall; hx dementia; pt denies any c/o at present; no tenderness with palpaiton to head/neck/back/chest/abd or extremities

## 2019-09-19 NOTE — ED Notes (Signed)
Verbal orders obtained from Dr Katharina Caper

## 2019-09-19 NOTE — ED Notes (Addendum)
Assumed care of pt upon being roomed. Laceration noted to L anterior medial forearm, gauze and paper tape applied, bleeding controlled. Pt has multiple bruises and varies cuts healing on all four extremities. Large bruise noted to R side of forehead. Awaiting CT, XR, and labs results at this time. Nephew at bedside. Pt has hx of dementia, alert and oriented to self and place, disoriented to situation and time. Side rails up x2. Pt outside of RN station, observable. Talking in full sentences with regular and unlabored breathing.

## 2019-09-19 NOTE — ED Provider Notes (Signed)
[facility] Emergency Department Provider Note ____________________________________________   First MD Initiated Contact with Patient 09/19/19 2211     (approximate)  I have reviewed the triage vital signs and the nursing notes.   HISTORY  Chief Complaint Fall  Level 5 caveat: History present illness limited due to dementia  HPI Jenna Robinson is a 86 y.o. female with PMH as noted below who presents from her facility after an apparent unwitnessed fall.  The patient was found down on the ground.  The patient herself endorses some pain in the chest and pain to her neck, but denies other acute symptoms.  Past Medical History:  Diagnosis Date  . Alzheimer disease (HCC)   . Diabetes mellitus without complication (HCC)   . Hypercholesteremia   . Hypertension   . Hyperthyroidism     Patient Active Problem List   Diagnosis Date Noted  . Goals of care, counseling/discussion   . Palliative care by specialist   . DNR (do not resuscitate)   . Chest tube in place   . Pneumothorax, closed, traumatic 09/12/2019  . Protein-calorie malnutrition, severe 08/23/2019  . Lactic acidosis 08/22/2019  . Sepsis secondary to UTI (HCC) 08/22/2019  . Acute metabolic encephalopathy 08/22/2019  . Inadequate oral intake 08/22/2019  . Dementia without behavioral disturbance (HCC) 08/22/2019  . Diabetes mellitus without complication (HCC) 08/22/2019  . Closed right hip fracture (HCC) 04/08/2019  . Hypercalcemia 05/22/2015  . Elevated troponin 01/02/2015  . Rhabdomyolysis 01/02/2015    Past Surgical History:  Procedure Laterality Date  . ABDOMINAL HYSTERECTOMY    . DILATION AND CURETTAGE, DIAGNOSTIC / THERAPEUTIC    . INTRAMEDULLARY (IM) NAIL INTERTROCHANTERIC Right 04/08/2019   Procedure: INTRAMEDULLARY (IM) NAIL INTERTROCHANTRIC;  Surgeon: Signa Kell, MD;  Location: [facility] ORS;  Service: Orthopedics;  Laterality: Right;    Prior to Admission medications     Medication Sig Start Date End Date Taking? Authorizing Provider  acetaminophen (TYLENOL) 325 MG tablet Take 650 mg by mouth every 6 (six) hours as needed for mild pain, moderate pain, fever or headache.    [provider]  alendronate (FOSAMAX) 70 MG tablet Take 1 tablet by mouth once a week. With 8 oz of water at least 30 minutes before food/meds. Do not lie down for 30 minutes after dose. 11/11/16   [provider]  alum & mag hydroxide-simeth (MYLANTA) 200-200-20 MG/5ML suspension Take 30 mLs by mouth every 6 (six) hours as needed for indigestion or heartburn.    [provider]  atenolol (TENORMIN) 25 MG tablet Take 25 mg by mouth daily.     [provider]  bismuth subsalicylate (PEPTO-BISMOL) 262 MG/15ML suspension Take 15 mLs by mouth every 6 (six) hours as needed.    [provider]  feeding supplement (BOOST HIGH PROTEIN) LIQD Take 1 Container by mouth 2 (two) times daily between meals.    [provider]  morphine 10 MG/5ML solution Take 10 mg by mouth every 2 (two) hours as needed for moderate pain or severe pain.    [provider]  oxybutynin (DITROPAN) 5 MG tablet Take 5 mg by mouth 2 (two) times daily.    [provider]    Allergies Penicillins  Family History  Problem Relation Age of Onset  . Cancer Other        multiple family members including all siblings    Social History Social History   Tobacco Use  . Smoking status: Never Smoker  . Smokeless  tobacco: Never Used  Substance Use Topics  . Alcohol use: No  . Drug use: Not on file    Review of Systems Level 5 caveat: Unable to obtain review of systems due to dementia    ____________________________________________   PHYSICAL EXAM:  VITAL SIGNS: ED Triage Vitals  Enc Vitals Group     BP 09/19/19 1910 113/85     Pulse Rate 09/19/19 1910 72     Resp 09/19/19 1910 18     Temp 09/19/19 1910 97.7 F (36.5 C)     Temp Source 09/19/19  1910 Oral     SpO2 09/19/19 1910 100 %     Weight 09/19/19 1920 112 lb 7 oz (51 kg)     Height 09/19/19 1920 5\' 9"  (1.753 m)     Head Circumference --      Peak Flow --      Pain Score 09/19/19 1920 0     Pain Loc --      Pain Edu? --      Excl. in GC? --     Constitutional: Alert, oriented x2.  Well appearing for age, in no acute distress. Eyes: Conjunctivae are normal.  EOMI.  PERRLA. Head: Subacute appearing ecchymosis to the forehead. Nose: No congestion/rhinnorhea. Mouth/Throat: Mucous membranes are moist.   Neck: Normal range of motion.  Mild midline spinal tenderness with no step-off or crepitus. Cardiovascular: Normal rate, regular rhythm. Grossly normal heart sounds.  Good peripheral circulation. Respiratory: Normal respiratory effort.  No retractions. Lungs CTAB. Gastrointestinal: Soft and nontender. No distention.  Genitourinary: No flank tenderness. Musculoskeletal: No lower extremity edema.  Extremities warm and well perfused.  Full range of motion to bilateral upper and lower extremities.  Scattered subacute appearing ecchymoses to bilateral arms.  3 cm superficial skin tear to left forearm.  No thoracic or lumbar midline tenderness. Neurologic: Normal speech.  Motor intact in all extremities.  Normal coordination. Skin:  Skin is warm and dry. No rash noted. Psychiatric: Calm and cooperative.  ____________________________________________   LABS (all labs ordered are listed, but only abnormal results are displayed)  Labs Reviewed  URINALYSIS, COMPLETE (UACMP) WITH MICROSCOPIC - Abnormal; Notable for the following components:      Result Value   Color, Urine YELLOW (*)    APPearance HAZY (*)    Glucose, UA >=500 (*)    Hgb urine dipstick LARGE (*)    Leukocytes,Ua TRACE (*)    RBC / HPF >50 (*)    Bacteria, UA RARE (*)    All other components within normal limits  BASIC METABOLIC PANEL  CBC WITH DIFFERENTIAL/PLATELET    ____________________________________________  EKG  Pending  ____________________________________________  RADIOLOGY  CT head: No ICH or other acute abnormality CT cervical spine: Pending CXR: Trace apical pneumothorax, unchanged from prior CXR at time of discharge  ____________________________________________   PROCEDURES  Procedure(s) performed: No  Procedures  Critical Care performed: No ____________________________________________   INITIAL IMPRESSION / ASSESSMENT AND PLAN / ED COURSE  Pertinent labs & imaging results that were available during my care of the patient were reviewed by me and considered in my medical decision making (see chart for details).  86 year old female with PMH as noted above presents after an apparent unwitnessed fall at her facility.  When asked specifically, the patient endorses some mild pain to the neck and chest but denies any other complaints and states that she wants to go home.  I reviewed the past medical records in epic.  The  patient was just admitted to the hospital on 8/31 and discharged on 9/3 after a fall resulting in a right-sided pneumothorax requiring a chest tube.  She also had hypokalemia and hypercalcemia at that time.  On exam currently, the patient is well-appearing for her age and oriented x2.  Her vital signs are normal.  She has some subacute appearing ecchymosis to the forehead and arms.  There is a small superficial skin tear to the left forearm.  She has good range of motion to all extremities.  She has mild midline cervical tenderness, but the rest of the spine is nontender.  Neurologic exam is nonfocal.  CT head was obtained from triage which is negative for ICH.  The patient also had a urinalysis.  This shows some RBCs and WBCs with rare bacteria, similar to her urinalyses from her last few visits, although 1 month ago she had significantly more WBCs and the UA was nitrite positive.  Clinically I do not suspect a  urinary tract infection.  Overall presentation is consistent with a mechanical fall.  I have ordered basic labs to evaluate for recurrent electrolyte abnormalities or leukocytosis.  I have also ordered an EKG as well as a chest x-ray due to the chest pain and history of recent pneumothorax to make sure this has not recurred.  In addition I have added a CT of the cervical spine although my clinical suspicion for fracture is very low.  If this additional work-up is negative, I anticipate discharge back to her facility.  ----------------------------------------- 11:03 PM on 09/19/2019 -----------------------------------------  CT cervical spine, labs and EKG are pending.  I have signed the patient out to the oncoming physician Dr. Don Perking.   ____________________________________________   FINAL CLINICAL IMPRESSION(S) / ED DIAGNOSES  Final diagnoses:  Fall, initial encounter      NEW MEDICATIONS STARTED DURING THIS VISIT:  New Prescriptions   No medications on file     Note:  This document was prepared using Dragon voice recognition software and may include unintentional dictation errors.   Dionne Bucy, MD 09/19/19 [phone]

## 2019-09-20 ENCOUNTER — Encounter: Payer: Self-pay | Admitting: Family Medicine

## 2019-09-20 ENCOUNTER — Other Ambulatory Visit: Payer: Self-pay

## 2019-09-20 DIAGNOSIS — A419 Sepsis, unspecified organism: Secondary | ICD-10-CM | POA: Diagnosis not present

## 2019-09-20 DIAGNOSIS — Z87440 Personal history of urinary (tract) infections: Secondary | ICD-10-CM | POA: Diagnosis not present

## 2019-09-20 DIAGNOSIS — N390 Urinary tract infection, site not specified: Secondary | ICD-10-CM | POA: Diagnosis not present

## 2019-09-20 DIAGNOSIS — E43 Unspecified severe protein-calorie malnutrition: Secondary | ICD-10-CM | POA: Diagnosis present

## 2019-09-20 DIAGNOSIS — S270XXA Traumatic pneumothorax, initial encounter: Secondary | ICD-10-CM | POA: Diagnosis present

## 2019-09-20 DIAGNOSIS — R3989 Other symptoms and signs involving the genitourinary system: Secondary | ICD-10-CM

## 2019-09-20 DIAGNOSIS — W19XXXA Unspecified fall, initial encounter: Secondary | ICD-10-CM

## 2019-09-20 DIAGNOSIS — R109 Unspecified abdominal pain: Secondary | ICD-10-CM | POA: Diagnosis present

## 2019-09-20 DIAGNOSIS — Z66 Do not resuscitate: Secondary | ICD-10-CM | POA: Diagnosis not present

## 2019-09-20 DIAGNOSIS — R945 Abnormal results of liver function studies: Secondary | ICD-10-CM | POA: Diagnosis present

## 2019-09-20 DIAGNOSIS — E871 Hypo-osmolality and hyponatremia: Secondary | ICD-10-CM | POA: Diagnosis present

## 2019-09-20 DIAGNOSIS — G309 Alzheimer's disease, unspecified: Secondary | ICD-10-CM | POA: Diagnosis not present

## 2019-09-20 DIAGNOSIS — Y92099 Unspecified place in other non-institutional residence as the place of occurrence of the external cause: Secondary | ICD-10-CM | POA: Diagnosis not present

## 2019-09-20 DIAGNOSIS — A4150 Gram-negative sepsis, unspecified: Secondary | ICD-10-CM | POA: Insufficient documentation

## 2019-09-20 DIAGNOSIS — Z20822 Contact with and (suspected) exposure to covid-19: Secondary | ICD-10-CM | POA: Diagnosis present

## 2019-09-20 DIAGNOSIS — D649 Anemia, unspecified: Secondary | ICD-10-CM | POA: Diagnosis present

## 2019-09-20 DIAGNOSIS — S270XXD Traumatic pneumothorax, subsequent encounter: Secondary | ICD-10-CM

## 2019-09-20 DIAGNOSIS — E7800 Pure hypercholesterolemia, unspecified: Secondary | ICD-10-CM | POA: Diagnosis present

## 2019-09-20 DIAGNOSIS — I10 Essential (primary) hypertension: Secondary | ICD-10-CM | POA: Diagnosis present

## 2019-09-20 DIAGNOSIS — Z880 Allergy status to penicillin: Secondary | ICD-10-CM | POA: Diagnosis not present

## 2019-09-20 DIAGNOSIS — S2241XA Multiple fractures of ribs, right side, initial encounter for closed fracture: Secondary | ICD-10-CM | POA: Diagnosis present

## 2019-09-20 DIAGNOSIS — F0280 Dementia in other diseases classified elsewhere without behavioral disturbance: Secondary | ICD-10-CM | POA: Diagnosis present

## 2019-09-20 DIAGNOSIS — Z681 Body mass index (BMI) 19 or less, adult: Secondary | ICD-10-CM | POA: Diagnosis not present

## 2019-09-20 DIAGNOSIS — M25511 Pain in right shoulder: Secondary | ICD-10-CM | POA: Diagnosis present

## 2019-09-20 DIAGNOSIS — E119 Type 2 diabetes mellitus without complications: Secondary | ICD-10-CM | POA: Diagnosis not present

## 2019-09-20 DIAGNOSIS — Z9181 History of falling: Secondary | ICD-10-CM | POA: Diagnosis not present

## 2019-09-20 DIAGNOSIS — B952 Enterococcus as the cause of diseases classified elsewhere: Secondary | ICD-10-CM | POA: Diagnosis present

## 2019-09-20 DIAGNOSIS — S0003XA Contusion of scalp, initial encounter: Secondary | ICD-10-CM | POA: Diagnosis present

## 2019-09-20 DIAGNOSIS — J90 Pleural effusion, not elsewhere classified: Secondary | ICD-10-CM | POA: Diagnosis present

## 2019-09-20 LAB — CBC
HCT: 28.3 % — ABNORMAL LOW (ref 36.0–46.0)
Hemoglobin: 9.4 g/dL — ABNORMAL LOW (ref 12.0–15.0)
MCH: 30.6 pg (ref 26.0–34.0)
MCHC: 33.2 g/dL (ref 30.0–36.0)
MCV: 92.2 fL (ref 80.0–100.0)
Platelets: 231 10*3/uL (ref 150–400)
RBC: 3.07 MIL/uL — ABNORMAL LOW (ref 3.87–5.11)
RDW: 15.9 % — ABNORMAL HIGH (ref 11.5–15.5)
WBC: 12 10*3/uL — ABNORMAL HIGH (ref 4.0–10.5)
nRBC: 0 % (ref 0.0–0.2)

## 2019-09-20 LAB — PROTIME-INR
INR: 1.1 (ref 0.8–1.2)
Prothrombin Time: 13.7 seconds (ref 11.4–15.2)

## 2019-09-20 LAB — BASIC METABOLIC PANEL
Anion gap: 11 (ref 5–15)
BUN: 20 mg/dL (ref 8–23)
CO2: 24 mmol/L (ref 22–32)
Calcium: 9.5 mg/dL (ref 8.9–10.3)
Chloride: 98 mmol/L (ref 98–111)
Creatinine, Ser: 0.73 mg/dL (ref 0.44–1.00)
GFR calc Af Amer: >60 mL/min (ref >60)
GFR calc non Af Amer: >60 mL/min (ref >60)
Glucose, Bld: 221 mg/dL — ABNORMAL HIGH (ref 70–99)
Potassium: 4.1 mmol/L (ref 3.5–5.1)
Sodium: 133 mmol/L — ABNORMAL LOW (ref 135–145)

## 2019-09-20 LAB — SARS CORONAVIRUS 2 BY RT PCR (HOSPITAL ORDER, PERFORMED IN ~~LOC~~ HOSPITAL LAB): SARS Coronavirus 2: NEGATIVE

## 2019-09-20 LAB — GLUCOSE, CAPILLARY
Glucose-Capillary: 167 mg/dL — ABNORMAL HIGH (ref 70–99)
Glucose-Capillary: 233 mg/dL — ABNORMAL HIGH (ref 70–99)
Glucose-Capillary: 196 mg/dL — ABNORMAL HIGH (ref 70–99)
Glucose-Capillary: 170 mg/dL — ABNORMAL HIGH (ref 70–99)

## 2019-09-20 LAB — TROPONIN I (HIGH SENSITIVITY)
Troponin I (High Sensitivity): 14 ng/L (ref <18)
Troponin I (High Sensitivity): 13 ng/L (ref <18)

## 2019-09-20 LAB — PROCALCITONIN: Procalcitonin: <0.1 ng/mL

## 2019-09-20 LAB — MRSA PCR SCREENING: MRSA by PCR: NEGATIVE

## 2019-09-20 LAB — LACTIC ACID, PLASMA
Lactic Acid, Venous: 2.7 mmol/L (ref 0.5–1.9)
Lactic Acid, Venous: 2.3 mmol/L (ref 0.5–1.9)

## 2019-09-20 MED ORDER — ENSURE ENLIVE PO LIQD
237.0000 mL | Freq: Two times a day (BID) | ORAL | Status: DC
  Administered 2019-09-20 – 2019-09-24 (×9): 237 mL via ORAL

## 2019-09-20 MED ORDER — LACTATED RINGERS IV SOLN: INTRAVENOUS | Status: DC

## 2019-09-20 MED ORDER — ACETAMINOPHEN 325 MG PO TABS
650.0000 mg | ORAL_TABLET | Freq: Four times a day (QID) | ORAL | Status: DC | PRN
  Administered 2019-09-20 – 2019-09-23 (×6): 650 mg via ORAL
  Filled 2019-09-20 (×6): qty 2

## 2019-09-20 MED ORDER — ACETAMINOPHEN 650 MG RE SUPP: 650.0000 mg | Freq: Four times a day (QID) | RECTAL | Status: DC | PRN

## 2019-09-20 MED ORDER — SENNOSIDES-DOCUSATE SODIUM 8.6-50 MG PO TABS
1.0000 | ORAL_TABLET | Freq: Every evening | ORAL | Status: DC | PRN
  Administered 2019-09-20: 1 via ORAL
  Filled 2019-09-20: qty 1

## 2019-09-20 MED ORDER — OXYBUTYNIN CHLORIDE 5 MG PO TABS
5.0000 mg | ORAL_TABLET | Freq: Two times a day (BID) | ORAL | Status: DC
  Administered 2019-09-20 – 2019-09-24 (×9): 5 mg via ORAL
  Filled 2019-09-20 (×10): qty 1

## 2019-09-20 MED ORDER — INSULIN ASPART 100 UNIT/ML ~~LOC~~ SOLN: 0.0000 [IU] | Freq: Every day | SUBCUTANEOUS | Status: DC

## 2019-09-20 MED ORDER — SODIUM CHLORIDE 0.9 % IV SOLN
1.0000 g | Freq: Once | INTRAVENOUS | Status: AC
  Administered 2019-09-20: 1 g via INTRAVENOUS
  Filled 2019-09-20: qty 10

## 2019-09-20 MED ORDER — INSULIN ASPART 100 UNIT/ML ~~LOC~~ SOLN
0.0000 [IU] | Freq: Three times a day (TID) | SUBCUTANEOUS | Status: DC
  Administered 2019-09-20 (×2): 2 [IU] via SUBCUTANEOUS
  Administered 2019-09-20: 3 [IU] via SUBCUTANEOUS
  Administered 2019-09-21: 1 [IU] via SUBCUTANEOUS
  Administered 2019-09-21: 2 [IU] via SUBCUTANEOUS
  Administered 2019-09-21: 3 [IU] via SUBCUTANEOUS
  Administered 2019-09-22: 2 [IU] via SUBCUTANEOUS
  Administered 2019-09-22: 7 [IU] via SUBCUTANEOUS
  Administered 2019-09-23: 5 [IU] via SUBCUTANEOUS
  Administered 2019-09-23: 3 [IU] via SUBCUTANEOUS
  Administered 2019-09-23: 2 [IU] via SUBCUTANEOUS
  Administered 2019-09-24: 1 [IU] via SUBCUTANEOUS
  Administered 2019-09-24: 3 [IU] via SUBCUTANEOUS
  Administered 2019-09-24: 2 [IU] via SUBCUTANEOUS
  Filled 2019-09-20 (×14): qty 1

## 2019-09-20 MED ORDER — SODIUM CHLORIDE 0.9 % IV SOLN
1.0000 g | INTRAVENOUS | Status: DC
  Administered 2019-09-21: 1 g via INTRAVENOUS
  Filled 2019-09-20: qty 1

## 2019-09-20 MED ORDER — ASPIRIN EC 81 MG PO TBEC
81.0000 mg | DELAYED_RELEASE_TABLET | Freq: Every day | ORAL | Status: DC
  Administered 2019-09-20 – 2019-09-24 (×5): 81 mg via ORAL
  Filled 2019-09-20 (×5): qty 1

## 2019-09-20 MED ORDER — LACTATED RINGERS IV BOLUS
1000.0000 mL | Freq: Once | INTRAVENOUS | Status: AC
  Administered 2019-09-20: 1000 mL via INTRAVENOUS

## 2019-09-20 MED ORDER — ATENOLOL 25 MG PO TABS
25.0000 mg | ORAL_TABLET | Freq: Every day | ORAL | Status: DC
  Administered 2019-09-20 – 2019-09-24 (×5): 25 mg via ORAL
  Filled 2019-09-20 (×5): qty 1

## 2019-09-20 MED ORDER — ORAL CARE MOUTH RINSE
15.0000 mL | Freq: Two times a day (BID) | OROMUCOSAL | Status: DC
  Administered 2019-09-20 – 2019-09-24 (×8): 15 mL via OROMUCOSAL

## 2019-09-20 MED ORDER — ENOXAPARIN SODIUM 40 MG/0.4ML ~~LOC~~ SOLN
40.0000 mg | SUBCUTANEOUS | Status: DC
  Administered 2019-09-20 – 2019-09-24 (×5): 40 mg via SUBCUTANEOUS
  Filled 2019-09-20 (×5): qty 0.4

## 2019-09-20 NOTE — NC FL2 (Signed)
[place] MEDICAID FL2 LEVEL OF CARE SCREENING TOOL     IDENTIFICATION  Patient Name: Jenna Robinson Birthdate: April 27, 1925 Sex: female Admission Date (Current Location): 09/19/2019  [place] and [place] Number:  Chiropodist and Address:  [facility], [address], [place], [place] [address]      Provider Number: [id number]  Attending Physician Name and Address:  Merlene Laughter, DO  Relative Name and Phone Number:       Current Level of Care: Hospital Recommended Level of Care: Skilled Nursing Facility Prior Approval Number:    Date Approved/Denied:   PASRR Number: [id number]  Discharge Plan: SNF    Current Diagnoses: Patient Active Problem List   Diagnosis Date Noted  . Sepsis due to gram-negative UTI (HCC) 09/20/2019  . UTI (urinary tract infection) 09/20/2019  . Fall 09/20/2019  . Goals of care, counseling/discussion   . Palliative care by specialist   . DNR (do not resuscitate)   . Chest tube in place   . Pneumothorax, closed, traumatic 09/12/2019  . Protein-calorie malnutrition, severe 08/23/2019  . Lactic acidosis 08/22/2019  . Sepsis secondary to UTI (HCC) 08/22/2019  . Acute metabolic encephalopathy 08/22/2019  . Inadequate oral intake 08/22/2019  . Dementia without behavioral disturbance (HCC) 08/22/2019  . Diabetes mellitus without complication (HCC) 08/22/2019  . Closed right hip fracture (HCC) 04/08/2019  . Hypercalcemia 05/22/2015  . Elevated troponin 01/02/2015  . Rhabdomyolysis 01/02/2015    Orientation RESPIRATION BLADDER Height & Weight     Self  Normal Incontinent Weight: 112 lb 7 oz (51 kg) Height:  5\' 9"  (175.3 cm)  BEHAVIORAL SYMPTOMS/MOOD NEUROLOGICAL BOWEL NUTRITION STATUS   (None)  (Dementia) Continent Diet (DYS 3)  AMBULATORY STATUS COMMUNICATION OF NEEDS Skin   Limited Assist Verbally Skin abrasions, Bruising, Other (Comment) (Catheter entry/exit. Skin tears on left lower anterior  arm, right posterior elbow (x3), left posterior hand, and left lower posterior proximal arm. Scabbed/bruised area on right posterior hand.)                       Personal Care Assistance Level of Assistance  Bathing, Feeding, Dressing Bathing Assistance: Limited assistance Feeding assistance: Limited assistance Dressing Assistance: Limited assistance     Functional Limitations Info  Sight, Hearing, Speech Sight Info: Adequate Hearing Info: Adequate Speech Info: Adequate    SPECIAL CARE FACTORS FREQUENCY  PT (By licensed PT), OT (By licensed OT)     PT Frequency: 5 x week OT Frequency: 5 x week            Contractures Contractures Info: Not present    Additional Factors Info  Code Status, Allergies Code Status Info: DNR Allergies Info: Penicillins           Current Medications (09/20/2019):  This is the current hospital active medication list Current Facility-Administered Medications  Medication Dose Route Frequency Provider Last Rate Last Admin  . acetaminophen (TYLENOL) tablet 650 mg  650 mg Oral Q6H PRN Chotiner, 11/20/2019, MD       Or  . acetaminophen (TYLENOL) suppository 650 mg  650 mg Rectal Q6H PRN Chotiner, Claudean Severance, MD      . aspirin EC tablet 81 mg  81 mg Oral Daily Chotiner, Claudean Severance, MD   81 mg at 09/20/19 1011  . atenolol (TENORMIN) tablet 25 mg  25 mg Oral Daily Chotiner, 11/20/19, MD   25 mg at 09/20/19 1011  . cefTRIAXone (ROCEPHIN) 1 g  in sodium chloride 0.9 % 100 mL IVPB  1 g Intravenous Q24H Chotiner, Claudean Severance, MD      . enoxaparin (LOVENOX) injection 40 mg  40 mg Subcutaneous Q24H Chotiner, Claudean Severance, MD   40 mg at 09/20/19 0630  . feeding supplement (ENSURE ENLIVE) (ENSURE ENLIVE) liquid 237 mL  237 mL Oral BID BM Sheikh, Omair Latif, DO      . insulin aspart (novoLOG) injection 0-5 Units  0-5 Units Subcutaneous QHS Chotiner, Claudean Severance, MD      . insulin aspart (novoLOG) injection 0-9 Units  0-9 Units Subcutaneous TID WC Chotiner, Claudean Severance, MD   3 Units at 09/20/19 1225  . lactated ringers infusion   Intravenous Continuous Chotiner, Claudean Severance, MD 75 mL/hr at 09/20/19 0600 New Bag at 09/20/19 0600  . oxybutynin (DITROPAN) tablet 5 mg  5 mg Oral BID Chotiner, Claudean Severance, MD   5 mg at 09/20/19 1011  . senna-docusate (Senokot-S) tablet 1 tablet  1 tablet Oral QHS PRN Chotiner, Claudean Severance, MD         Discharge Medications: Please see discharge summary for a list of discharge medications.  Relevant Imaging Results:  Relevant Lab Results:   Additional Information SS#: [id number]  Margarito Liner, LCSW

## 2019-09-20 NOTE — Progress Notes (Addendum)
Initial Nutrition Assessment  DOCUMENTATION CODES:   Severe malnutrition in context of chronic illness  INTERVENTION:   Ensure Enlive po BID, each supplement provides 350 kcal and 20 grams of protein  Magic cup TID with meals, each supplement provides 290 kcal and 9 grams of protein  Dysphagia 3 diet   Pt at high refeed risk; recommend monitor K, Mg and P labs daily as oral intake improves.   NUTRITION DIAGNOSIS:   Severe Malnutrition related to chronic illness (dementia) as evidenced by severe fat depletion, severe muscle depletion.  GOAL:   Patient will meet greater than or equal to 90% of their needs  MONITOR:   PO intake, Supplement acceptance, Labs, Weight trends, I & O's, Skin  REASON FOR ASSESSMENT:   Malnutrition Screening Tool    ASSESSMENT:   86 y.o. female with medical history significant of Alzheimer's dementia, hard of hearing, diabetes mellitus, hypertension, hypothyroidism, hip fx in March s/p repair, frequent falls ( most recent one with right pneumothorax, multiple displaced right rib (third, fourth and fifth) fractures and small right scalp hematoma) who is now admitted after another fall and found to have UTI and sepsis   Pt is a poor historian. Pt is known to nutrition department and this RD from multiple previous admits. Pt with poor appetite and oral intake at baseline r/t dementia. Pt reports that she does enjoy drinking Ensure supplements. Pt with chronic severe malnutrition. Per chart, pt is down 57lbs(34%) from her UBW; RD unsure how recently weight loss occurred. RD will add supplements and mechanical soft diet to help pt meet her estimated needs. Pt is likely at refeed risk.   Medications reviewed and include: aspirin, lovenox, insulin, ceftriaxone, LRS @75ml /hr  Labs reviewed: Na 133(L) Wbc- 12.0(H), Hgb 9.4(L), Hct 28.3(L) AIC 6.0(H)- 8/10  NUTRITION - FOCUSED PHYSICAL EXAM:    Most Recent Value  Orbital Region Severe depletion  Upper Arm  Region Severe depletion  Thoracic and Lumbar Region Severe depletion  Buccal Region Severe depletion  Temple Region Severe depletion  Clavicle Bone Region Severe depletion  Clavicle and Acromion Bone Region Severe depletion  Scapular Bone Region Severe depletion  Dorsal Hand Severe depletion  Patellar Region Severe depletion  Anterior Thigh Region Severe depletion  Posterior Calf Region Severe depletion  Edema (RD Assessment) None  Hair Reviewed  Eyes Reviewed  Mouth Reviewed  Skin Reviewed  Nails Reviewed     Diet Order:   Diet Order            DIET DYS 3 Room service appropriate? No; Fluid consistency: Thin  Diet effective now                EDUCATION NEEDS:   No education needs have been identified at this time  Skin:  Skin Assessment: Reviewed RN Assessment (ecchymosis, laceration L forearm)  Last BM:  pta  Height:   Ht Readings from Last 1 Encounters:  09/19/19 5\' 9"  (1.753 m)    Weight:   Wt Readings from Last 1 Encounters:  09/19/19 51 kg    Ideal Body Weight:  65.9 kg  BMI:  Body mass index is 16.6 kg/m.  Estimated Nutritional Needs:   Kcal:  1400-1600kcal/day  Protein:  70-80g/day  Fluid:  >1.3L/day  MS, RD, LDN Please refer to [facility] for RD and/or RD on-call/weekend/after hours pager

## 2019-09-20 NOTE — Evaluation (Signed)
Occupational Therapy Evaluation Patient Details Name: Jenna Robinson MRN: [id number] DOB: 05/13/1925 Today's Date: 09/20/2019    History of Present Illness 86 yo Female presents to hospital after sustaining a fall at her facility. She presents with elevated WBC and lactic acid indicating possible UTI with sepsis. Patient has PMH significant for HTN, DMx2, Alzheimers, Dementia, malnutrition; Recent CT (-) for acute abnormality;   Clinical Impression   Jenna Robinson was seen for OT evaluation this date. Prior to hospital admission, pt was at [employer]. Pt unable to report PLOF, but expect she required assist for I/ADLs. Pt presents to acute OT demonstrating impaired ADL performance, functional cognition, and functional mobility 2/2 decreased insight into deficits, decreased safety awareness, functional strength/balance deficits, and decreased activity tolerance. Pt currently requires SBA doff socks seated EOC, MIN A don - VCs for sequencing. MIN A for ADL t/f. VCs for self-feeding seated in chair. Pt would benefit from skilled OT to address noted impairments and functional limitations (see below for any additional details) in order to maximize safety and independence while minimizing falls risk and caregiver burden. Upon hospital discharge, recommend STR to maximize pt safety and return to PLOF.     Follow Up Recommendations  SNF (SUPERVISION for OOB/MOBILITY)    Equipment Recommendations   (TBD)    Recommendations for Other Services       Precautions / Restrictions Precautions Precautions: Fall Restrictions Weight Bearing Restrictions: No      Mobility Bed Mobility Overal bed mobility: Needs Assistance Bed Mobility: Sit to Supine     Sit to supine: Supervision   General bed mobility comments: Increased time + VCs to return to sup at flat bed.   Transfers Overall transfer level: Needs assistance Equipment used: 1 person hand held assist Transfers: Sit to/from  [employer] Sit to Stand: Min assist Stand pivot transfers: Min assist       General transfer comment: MIN A + HHA for SPT chair>bed    Balance Overall balance assessment: Needs assistance Sitting-balance support: Single extremity supported;Feet supported Sitting balance-Leahy Scale: Fair     Standing balance support: Single extremity supported;During functional activity Standing balance-Leahy Scale: Poor Standing balance comment: requires min A for standing balance;      ADL either performed or assessed with clinical judgement   ADL Overall ADL's : Needs assistance/impaired        General ADL Comments: SBA doff socks seated EOC, MIN A don - VCs for sequencing. MIN A for ADL t/f. VCs for self-feeding seated in chair      Pertinent Vitals/Pain Pain Assessment: No/denies pain Pain Location: reports RUE pain but unable to rate pain 0-10, unable to describe pain or reports severity with face scale; Pain Intervention(s): Limited activity within patient's tolerance     Hand Dominance Right   Extremity/Trunk Assessment Upper Extremity Assessment Upper Extremity Assessment: Generalized weakness (L grip 3+/5, R grip 4/5. 5 finger opposition intact)   Lower Extremity Assessment Lower Extremity Assessment: Generalized weakness RLE Deficits / Details: ROM is WFL, strength grosssly 4-/5 LLE Deficits / Details: ROM is WFL, strength grosssly 4-/5   Cervical / Trunk Assessment Cervical / Trunk Assessment: Kyphotic   Communication Communication Communication: No difficulties (Repetitive statements)   Cognition Arousal/Alertness: Awake/alert Behavior During Therapy: Flat affect Overall Cognitive Status: No family/caregiver present to determine baseline cognitive functioning    General Comments: pt has history of dementia/Alzheimers; unable to determine baseline;   General Comments  Multiple skin tears to BUE noted, bruises  noted to forehead    Exercises  Exercises: Other exercises Other Exercises Other Exercises: Pt educated re: OT role, falls prevention, safety in room, call bell orientation Other Exercises: Self-feeding, LBD, bed mobility, SPT, sitting/standing balance/tolerance   Shoulder Instructions      Home Living Family/patient expects to be discharged to:: Skilled nursing facility            Additional Comments: [facility]      Prior Functioning/Environment Level of Independence: Needs assistance  Gait / Transfers Assistance Needed: pt reports using RW for walking prn; ADL's / Homemaking Assistance Needed: unsure- patient states she was independent in bathing and dressing and did her own cooking; however patient is from SNF and likely required assistance for all ADLs.   Comments: Pt poor historian, no family present therefore unable to confirm PLOF. She presents to hospital from SNF and likely required assistance for most ADLs. She has a high fall history;        OT Problem List: Decreased strength;Decreased range of motion;Decreased activity tolerance;Impaired balance (sitting and/or standing);Decreased safety awareness;Decreased cognition      OT Treatment/Interventions: Self-care/ADL training;Therapeutic exercise;Energy conservation;DME and/or AE instruction;Therapeutic activities;Patient/family education;Balance training    OT Goals(Current goals can be found in the care plan section) Acute Rehab OT Goals Patient Stated Goal: to get better; OT Goal Formulation: With patient Time For Goal Achievement: 10/04/19 Potential to Achieve Goals: Good ADL Goals Pt Will Perform Grooming: standing;with min guard assist (c LRAD PRN) Pt Will Transfer to Toilet: with supervision;ambulating;bedside commode (c LRAD PRN)  OT Frequency: Min 1X/week   Barriers to D/C: Decreased caregiver support          AM-PAC OT "6 Clicks" Daily Activity     Outcome Measure Help from another person eating meals?: None Help from  another person taking care of personal grooming?: A Little Help from another person toileting, which includes using toliet, bedpan, or urinal?: A Little Help from another person bathing (including washing, rinsing, drying)?: A Lot Help from another person to put on and taking off regular upper body clothing?: A Little Help from another person to put on and taking off regular lower body clothing?: A Little 6 Click Score: 18   End of Session    Activity Tolerance: Patient limited by fatigue Patient left: in bed;with call bell/phone within reach;with bed alarm set  OT Visit Diagnosis: Other abnormalities of gait and mobility (R26.89);History of falling (Z91.81)                Time: 3557-3220 OT Time Calculation (min): 13 min Charges:  OT General Charges $OT Visit: 1 Visit OT Evaluation $OT Eval Low Complexity: 1 Low OT Treatments $Self Care/Home Management : 8-22 mins  Kathie Dike, M.S. OTR/L  09/20/19, 4:40 PM  ascom [phone]

## 2019-09-20 NOTE — Evaluation (Signed)
Physical Therapy Evaluation Patient Details Name: Jenna Robinson MRN: [id number] DOB: [birth year] Today's Date: 09/20/2019   History of Present Illness  86 yo Female presents to hospital after sustaining a fall at her facility. She presents with elevated WBC and lactic acid indicating possible UTI with sepsis. Patient has PMH significant for HTN, DMx2, Alzheimers, Dementia, malnutrition; Recent CT (-) for acute abnormality;  Clinical Impression  86 yo pleasant female presents to hospital after recent fall at her facility. She has Alzheimer's/dementia and is therefore a poor historian. When asked about PLOF patient reports she was able to shower/dress herself, did her own cooking and would walk without assistive device or use a walker at times. Patient does have a significant fall history indicating poor safety awareness and balance. She does require min A for bed mobility, transfers and ambulation. She walked in her room x15 feet with IV pole requiring min A for direction and safety with pole placement as well as min A for trunk control for balance. She gets confused stating one minute she needs to use the restroom and then upon standing requests to sit down stating, "I don't need to go, I just want to sit here and rest." She would benefit from skilled PT Intervention to improve strength, balance and mobility; Recommend SNF rehab upon discharge for strengthening and mobility safety;     Follow Up Recommendations SNF;Supervision/Assistance - 24 hour    Equipment Recommendations       Recommendations for Other Services       Precautions / Restrictions Precautions Precautions: Fall Restrictions Weight Bearing Restrictions: No      Mobility  Bed Mobility Overal bed mobility: Needs Assistance Bed Mobility: Supine to Sit     Supine to sit: Min assist     General bed mobility comments: with elevated head of bed, using bed rails, increased effort required;  Transfers Overall  transfer level: Needs assistance Equipment used: None Transfers: Sit to/from Stand Sit to Stand: Min assist         General transfer comment: with cues for hand placement; patient had a harder time standing from lower seat (recliner chair) requiring cues for forward weight shift for better transfer ability;  Ambulation/Gait Ambulation/Gait assistance: Min Chemical engineer (Feet): 15 Feet Assistive device: IV Pole Gait Pattern/deviations: Step-through pattern;Decreased step length - right;Decreased step length - left;Trunk flexed;Narrow base of support Gait velocity: decreased   General Gait Details: ambulates pushing IV pole, requires min A for safety and controlling IV pole positioning, ambulates with slower gait speed with slight shuffled steps;  Stairs            Wheelchair Mobility    Modified Rankin (Stroke Patients Only)       Balance Overall balance assessment: Needs assistance Sitting-balance support: Bilateral upper extremity supported;Feet supported Sitting balance-Leahy Scale: Fair     Standing balance support: Bilateral upper extremity supported;During functional activity Standing balance-Leahy Scale: Poor Standing balance comment: requires min A for standing balance;                             Pertinent Vitals/Pain Pain Assessment: 0-10 Pain Location: reports RUE pain but unable to rate pain 0-10, unable to describe pain or reports severity with face scale; Pain Intervention(s): Limited activity within patient's tolerance    Home Living Family/patient expects to be discharged to:: Skilled nursing facility  Additional Comments: Designer, television/film set    Prior Function Level of Independence: Needs assistance   Gait / Transfers Assistance Needed: pt reports using RW for walking prn;  ADL's / Homemaking Assistance Needed: unsure- patient states she was independent in bathing and dressing and did her own cooking;  however patient is from SNF and likely required assistance for all ADLs.  Comments: Pt poor historian, no family present therefore unable to confirm PLOF. She presents to hospital from SNF and likely required assistance for most ADLs. She has a high fall history;     Hand Dominance        Extremity/Trunk Assessment   Upper Extremity Assessment Upper Extremity Assessment: Generalized weakness    Lower Extremity Assessment Lower Extremity Assessment: RLE deficits/detail;LLE deficits/detail RLE Deficits / Details: ROM is WFL, strength grosssly 4-/5 LLE Deficits / Details: ROM is WFL, strength grosssly 4-/5    Cervical / Trunk Assessment Cervical / Trunk Assessment: Kyphotic  Communication   Communication: No difficulties  Cognition Arousal/Alertness: Awake/alert Behavior During Therapy: WFL for tasks assessed/performed Overall Cognitive Status: Difficult to assess                                 General Comments: pt has history of dementia/Alzheimers; unable to determine baseline;      General Comments General comments (skin integrity, edema, etc.): exhibits large bruise on forehead as well as multiple bruises on BUE arms; no swelling in legs noted;    Exercises     Assessment/Plan    PT Assessment Patient needs continued PT services  PT Problem List Decreased strength;Decreased mobility;Decreased safety awareness;Decreased activity tolerance;Decreased balance       PT Treatment Interventions DME instruction;Therapeutic exercise;Gait training;Balance training;Neuromuscular re-education;Functional mobility training;Therapeutic activities;Patient/family education    PT Goals (Current goals can be found in the Care Plan section)  Acute Rehab PT Goals Patient Stated Goal: to get better; PT Goal Formulation: With patient Time For Goal Achievement: 10/04/19 Potential to Achieve Goals: Good    Frequency Min 2X/week   Barriers to discharge   none     Co-evaluation               AM-PAC PT "6 Clicks" Mobility  Outcome Measure Help needed turning from your back to your side while in a flat bed without using bedrails?: A Little Help needed moving from lying on your back to sitting on the side of a flat bed without using bedrails?: A Little Help needed moving to and from a bed to a chair (including a wheelchair)?: A Little Help needed standing up from a chair using your arms (e.g., wheelchair or bedside chair)?: A Little Help needed to walk in hospital room?: A Little Help needed climbing 3-5 steps with a railing? : A Lot 6 Click Score: 17    End of Session Equipment Utilized During Treatment: Gait belt Activity Tolerance: Patient tolerated treatment well;No increased pain Patient left: in chair;with call bell/phone within reach;with chair alarm set Nurse Communication: Mobility status PT Visit Diagnosis: Unsteadiness on feet (R26.81);History of falling (Z91.81);Difficulty in walking, not elsewhere classified (R26.2);Muscle weakness (generalized) (M62.81)    Time: 7412-8786 PT Time Calculation (min) (ACUTE ONLY): 17 min   Charges:   PT Evaluation $PT Eval Low Complexity: 1 Low           Bryony Kaman PT, DPT 09/20/2019, 1:38 PM

## 2019-09-20 NOTE — Progress Notes (Signed)
CODE SEPSIS - PHARMACY COMMUNICATION  **Broad Spectrum Antibiotics should be administered within 1 hour of Sepsis diagnosis**  Time Code Sepsis Called/Page Received: 0215  Antibiotics Ordered: ceftriaxone  Time of 1st antibiotic administration: 0038  Additional action taken by pharmacy:   If necessary, Name of Provider/Nurse Contacted:     Thomasene Ripple ,PharmD Clinical Pharmacist  09/20/2019  2:22 AM

## 2019-09-20 NOTE — ED Notes (Signed)
Blood cultures and lactic obtained prior to IV abx administered

## 2019-09-20 NOTE — TOC Initial Note (Signed)
Transition of Care [facility]) - Initial/Assessment Note    Patient Details  Name: Jenna Robinson MRN: [id number] Date of Birth: 1925/12/03  Transition of Care [facility]) CM/SW Contact:    Margarito Liner, LCSW Phone Number: 09/20/2019, 2:48 PM  Clinical Narrative:  Patient only oriented to self. CSW called patient's legal guardian, Cristal Generous (who is NOT her niece), introduced role, and explained that discharge planning would be discussed. Ms. Luetta Nutting confirmed that patient was at [facility] SNF getting rehab prior to admission and plan is for her to return once discharged. No further concerns. CSW encouraged patient's guardian to contact CSW as needed. CSW will continue to follow patient and her guardian for support and facilitate return to SNF once medically stable. CSW spoke with [facility] admissions coordinator earlier today and she confirmed they can accept patient back whenever she is stable.                Expected Discharge Plan: Skilled Nursing Facility Barriers to Discharge: Continued Medical Work up   Patient Goals and CMS Choice Patient states their goals for this hospitalization and ongoing recovery are:: Patient not fully oriented.   Choice offered to / list presented to : NA  Expected Discharge Plan and Services Expected Discharge Plan: Skilled Nursing Facility     Post Acute Care Choice: Resumption of Svcs/PTA Provider Living arrangements for the past 2 months: Assisted Living Facility, Skilled Nursing Facility                                      Prior Living Arrangements/Services Living arrangements for the past 2 months: Assisted Living Facility, Skilled Nursing Facility Lives with:: Facility Resident Patient language and need for interpreter reviewed:: Yes Do you feel safe going back to the place where you live?: Yes      Need for Family Participation in Patient Care: Yes (Comment) Care giver support system in place?: Yes (comment)    Criminal Activity/Legal Involvement Pertinent to Current Situation/Hospitalization: No - Comment as needed  Activities of Daily Living Home Assistive Devices/Equipment: Wheelchair ADL Screening (condition at time of admission) Patient's cognitive ability adequate to safely complete daily activities?: No Is the patient deaf or have difficulty hearing?: No Does the patient have difficulty seeing, even when wearing glasses/contacts?: No Does the patient have difficulty concentrating, remembering, or making decisions?: Yes Patient able to express need for assistance with ADLs?: No Does the patient have difficulty dressing or bathing?: Yes Independently performs ADLs?: No Communication: Independent Dressing (OT): Needs assistance Is this a change from baseline?: Pre-admission baseline Grooming: Needs assistance Is this a change from baseline?: Pre-admission baseline Feeding: Needs assistance Is this a change from baseline?: Pre-admission baseline Bathing: Needs assistance Is this a change from baseline?: Pre-admission baseline Toileting: Needs assistance Is this a change from baseline?: Pre-admission baseline In/Out Bed: Needs assistance Is this a change from baseline?: Pre-admission baseline Walks in Home: Needs assistance Is this a change from baseline?: Pre-admission baseline Does the patient have difficulty walking or climbing stairs?: Yes Weakness of Legs: Both Weakness of Arms/Hands: Both  Permission Sought/Granted Permission sought to share information with : Facility Medical sales representative, Guardian    Share Information with NAME: Cristal Generous  Permission granted to share info w AGENCY: [facility]  Permission granted to share info w Relationship: Legal guardian  Permission granted to share info w Contact Information: [phone]  Emotional Assessment Appearance:: Appears stated age Attitude/Demeanor/Rapport: Unable to Assess Affect (typically observed):  Unable to Assess Orientation: : Oriented to Self Alcohol / Substance Use: Not Applicable Psych Involvement: No (comment)  Admission diagnosis:  Sepsis due to urinary tract infection (HCC) [A41.9, N39.0] Fall, initial encounter [W19.XXXA] Sepsis due to gram-negative UTI (HCC) [A41.50, N39.0] Patient Active Problem List   Diagnosis Date Noted  . Sepsis due to gram-negative UTI (HCC) 09/20/2019  . UTI (urinary tract infection) 09/20/2019  . Fall 09/20/2019  . Goals of care, counseling/discussion   . Palliative care by specialist   . DNR (do not resuscitate)   . Chest tube in place   . Pneumothorax, closed, traumatic 09/12/2019  . Protein-calorie malnutrition, severe 08/23/2019  . Lactic acidosis 08/22/2019  . Sepsis secondary to UTI (HCC) 08/22/2019  . Acute metabolic encephalopathy 08/22/2019  . Inadequate oral intake 08/22/2019  . Dementia without behavioral disturbance (HCC) 08/22/2019  . Diabetes mellitus without complication (HCC) 08/22/2019  . Closed right hip fracture (HCC) 04/08/2019  . Hypercalcemia 05/22/2015  . Elevated troponin 01/02/2015  . Rhabdomyolysis 01/02/2015   PCP:  Mickel Fuchs, MD Pharmacy:   [facility] - [place], [place] - [address] [place] [address] Phone: [phone] Fax: [phone]  [facility] - [place], [place] - [address] [place] [address] Phone: [phone] Fax: [phone]     Social Determinants of Health (SDOH) Interventions    Readmission Risk Interventions No flowsheet data found.

## 2019-09-20 NOTE — Progress Notes (Signed)
Care started prior to midnight in the emergency room and patient was admitted early this morning after midnight and I am in current agreement with assessment plan done by Dr. Harrold Donath.  Additional changes have been made to the plan of care have been made accordingly.  The patient is a elderly 86 year old Caucasian female with a past medical history significant for but not limited to diabetes mellitus type 2, hypertension, Alzheimer's dementia as well as other comorbidities including hypercholesterolemia, hypertension, hypothyroidism who was found on the ground and had a unwitnessed fall at her facility.  She did not complain of any headache, neck pain or back pain in her extremities but she did report to have some abdominal pain when questioned examined.  She does have a history of UTIs in the past and has had a fall with remote fracture last month with a small apical pneumothorax on chest x-ray.  In the ED she is found to have elevated elevated lactic acid level, elevated WBC of 17,000 and UA consistent with UTI.  She is empirically started on IV ceftriaxone and her small apical pneumothorax on chest x-ray was unchanged.  The dietitian was consulted and have severe malnutrition in the context of chronic illness.  PT OT will also be evaluating.  Currently she is being admitted for and treated for the following but not limited to:  Acute Suspected UTI ([place]); Sepsis ruled out -Ms. Pry was admitted to medical surgical floor with what originally was thought to be sepsis secondary to urinary tract infection.  -On admission She has elevated white blood cell count and lactic acid level but was afebrile, had No tachycardia, or tachypenia; LA elevation could have been in the setting of ? Dehydration; She only met 1 of the SIRS Criteria -urinalysis showed hazy appearance with yellow color urine, greater than 500 glucose, large hemoglobin, trace leukocytes, negative nitrites, rare bacteria, greater than 50 RBCs  per high-power field, and 6-10 WBCs with urine culture pending -PCT was <0.10 -She has been given IV fluids in the emergency room with LR 1 Liter bolus and was given IVF with LR at 150 mL/hr but will change to 75 mL/hr -We will recheck lactic acid level in a few hours and is improving  -Patient started on Rocephin for antibiotic coverage.  Blood and urine cultures ordered the emergency room and will be monitored. -IV fluid hydration provided. -WBC is improving and went from 17.1 -> 12.0 -Continue to Monitor Temperature Curve and WBC and follow up on Cx's   Acute Fall -In the setting of UTI -CT Head showed "Atrophy, chronic microvascular disease. No acute intracranial abnormality" -CT Cervical Spine showed "Degenerative changes throughout the cervical spine. Reversal of the usual cervical lordosis with slight anterior subluxation of C3 on C4. No acute displaced fractures identified. Small right apical pneumothorax is again demonstrated, possibly increased since previous study."   Diabetes mellitus without complication (HCC) -Monitor blood sugars before every meal and nightly.   -Sensitive Novolog SSI AC/HS provided for glycemic control.  -Hemoglobin A1c was done last month so would not be repeated at this time.  Was less than 7 -Last CBG Check wa [id number]  Protein-calorie malnutrition, severe -Chronic.  Encourage p.o. intake.   -Dietary consult in the morning -Continue with Ensure Enlive p.o. twice daily as well as Magic cup 3 times daily with meals and a dysphagia 3 diet  Dementia without behavioral disturbance (HCC) -Chronic and stable  Pneumothorax, closed, traumatic -Stable from previous chest x-ray from a fall  last month. -CTA shows Possibly increased size since last study -C/w Serial CXR and Monitoring of Respiratory Status   HTN -C/w Atenolol 25 mg po Daily   Hyponatremia -Mild -Na+ went from 134 -> 133 -Currently getting LR; May change to NS if continues to  [place] -Repeat CMP in the AM  Normocytic Anemia -Hgb/Hct went from 11.1/33.0 -> 9.4/28.3 -Check Anemia Panel in the AM -Continue to Monitor for S/Sx of Bleeding; No overt bleeding currently noted -Repeat CBC in the AM   GOC: DNR, poA  We will continue to monitor the patient's clinical response to intervention and repeat blood work in the a.m.  Status is: Inpatient  Remains inpatient appropriate because:Hemodynamically unstable, Unsafe d/c plan, IV treatments appropriate due to intensity of illness or inability to take PO and Inpatient level of care appropriate due to severity of illness   Dispo: The patient is from: SNF              Anticipated d/c is to: SNF              Anticipated d/c date is:1-2 days              Patient currently is medically stable to d/c.

## 2019-09-20 NOTE — ED Provider Notes (Addendum)
UA positive for UTI, elevated WBC and lactic acid concerning for urosepsis. HD stable. Patient given IVF, IV rocephin. Culture sent. Will admit to Hospitalist   I have personally reviewed the images performed during this visit and I agree with the Radiologist's read.   Interpretation by Radiologist:  CT Head Wo Contrast  Result Date: 09/19/2019 CLINICAL DATA:  Fall EXAM: CT HEAD WITHOUT CONTRAST TECHNIQUE: Contiguous axial images were obtained from the base of the skull through the vertex without intravenous contrast. COMPARISON:  09/12/2019 FINDINGS: Brain: There is atrophy and chronic small vessel disease changes. No acute intracranial abnormality. Specifically, no hemorrhage, hydrocephalus, mass lesion, acute infarction, or significant intracranial injury. Vascular: No hyperdense vessel or unexpected calcification. Skull: No acute calvarial abnormality. Sinuses/Orbits: Visualized paranasal sinuses and mastoids clear. Orbital soft tissues unremarkable. Other: None IMPRESSION: Atrophy, chronic microvascular disease. No acute intracranial abnormality. Electronically Signed   By: Charlett Nose M.D.   On: 09/19/2019 20:14   CT Cervical Spine Wo Contrast  Result Date: 09/19/2019 CLINICAL DATA:  Right-sided neck pain after unwitnessed fall earlier today. EXAM: CT CERVICAL SPINE WITHOUT CONTRAST TECHNIQUE: Multidetector CT imaging of the cervical spine was performed without intravenous contrast. Multiplanar CT image reconstructions were also generated. COMPARISON:  09/12/2019 FINDINGS: Alignment: There is reversal of the usual cervical lordosis with slight anterior subluxation of C3 on C4. This appearance is unchanged since the previous study and likely represents degenerative change. C1-2 articulation appears intact. Skull base and vertebrae: No vertebral compression deformities. No focal bone lesion or bone destruction. Bone cortex appears intact. Posterior elements and skull base appear intact. Soft tissues  and spinal canal: No prevertebral soft tissue swelling. No abnormal paraspinal soft tissue mass or infiltration. Disc levels: Degenerative changes throughout the cervical spine with narrowed interspaces and endplate hypertrophic change. Prominent degenerative changes in the posterior elements. Ligamentous calcification at C1-2. Upper chest: A small right apical pneumothorax is again demonstrated, possibly increased since the previous study. Other: None. IMPRESSION: 1. Degenerative changes throughout the cervical spine. Reversal of the usual cervical lordosis with slight anterior subluxation of C3 on C4. No acute displaced fractures identified. 2. Small right apical pneumothorax is again demonstrated, possibly increased since previous study. Electronically Signed   By: Burman Nieves M.D.   On: 09/19/2019 23:20   DG Chest Portable 1 View  Result Date: 09/19/2019 CLINICAL DATA:  Chest pain after fall EXAM: PORTABLE CHEST 1 VIEW COMPARISON:  09/14/2019 FINDINGS: Single frontal view of the chest demonstrates a stable cardiac silhouette. No airspace disease or effusion. The trace right apical pneumothorax seen on prior examination is unchanged. Multiple displaced right posterolateral rib fractures are again noted, stable. Resolution of the subcutaneous gas seen previously. No new bony abnormalities. IMPRESSION: 1. Stable right posterolateral rib fractures and trace right apical pneumothorax. No new process. Electronically Signed   By: Sharlet Salina M.D.   On: 09/19/2019 22:41      Nita Sickle, MD 09/20/19 [id number]    Nita Sickle, MD 09/20/19 [phone]

## 2019-09-20 NOTE — ED Notes (Signed)
Pt taken to room 7 to be placed on bedpan. Pt voided . Brief dry. Pt brought back to hallway and continued eating meal. Given more ice water.

## 2019-09-20 NOTE — H&P (Signed)
History and Physical    Shanicqua Coldren UYQ:[id number] DOB: [birth year] DOA: 09/19/2019  PCP: Mickel Fuchs, MD   Patient coming from: SNF  Chief Complaint: Fall at the facility  HPI: Jenna Robinson is a 86 y.o. female with medical history significant for diabetes mellitus type 2, hypertension, Alzheimer's dementia.  She presents by EMS after having a fall at the facility.  She was found on the ground and the fall was unwitnessed.  She does not complain of any headache, neck pain, back pain or pain in her extremities.  She does report some lower abdominal pain when questioned and examined.  When asked if she has any burning with urination she states yes.  Is noted in her chart that she has history of UTIs in the past and she had a fall with some rib fractures last month with a small apical pneumothorax on chest x-ray.  There is no report of any fever, vomiting or diarrhea.  She has had the COVID-19 vaccine at her SNF.  ED Course: In the emergency room Ms. Hitt is found to have elevated lactic acid level, elevated white blood cell count with UTI on urinalysis.  Started on Rocephin for antibiotic coverage.  Patient has a small apical pneumothorax on chest x-ray that is unchanged from previous x-rays a few weeks ago.  Review of Systems:  Cannot obtain accurate review of systems secondary to dementia  Past Medical History:  Diagnosis Date  . Alzheimer disease (HCC)   . Diabetes mellitus without complication (HCC)   . Hypercholesteremia   . Hypertension   . Hyperthyroidism     Past Surgical History:  Procedure Laterality Date  . ABDOMINAL HYSTERECTOMY    . DILATION AND CURETTAGE, DIAGNOSTIC / THERAPEUTIC    . INTRAMEDULLARY (IM) NAIL INTERTROCHANTERIC Right 04/08/2019   Procedure: INTRAMEDULLARY (IM) NAIL INTERTROCHANTRIC;  Surgeon: Signa Kell, MD;  Location: [facility] ORS;  Service: Orthopedics;  Laterality: Right;    Social History  reports that she has never smoked. She  has never used smokeless tobacco. She reports that she does not drink alcohol. No history on file for drug use.  Allergies  Allergen Reactions  . Penicillins Itching    Did it involve swelling of the face/tongue/throat, SOB, or low BP? No Did it involve sudden or severe rash/hives, skin peeling, or any reaction on the inside of your mouth or nose? Yes Did you need to seek medical attention at a hospital or doctor's office? No When did it last happen? If all above answers are "NO", may proceed with cephalosporin use.    Family History  Problem Relation Age of Onset  . Cancer Other        multiple family members including all siblings     Prior to Admission medications   Medication Sig Start Date End Date Taking? Authorizing Provider  acetaminophen (TYLENOL) 325 MG tablet Take 650 mg by mouth every 6 (six) hours as needed for mild pain, moderate pain, fever or headache.    [provider]  alendronate (FOSAMAX) 70 MG tablet Take 1 tablet by mouth once a week. With 8 oz of water at least 30 minutes before food/meds. Do not lie down for 30 minutes after dose. 11/11/16   [provider]  alum & mag hydroxide-simeth (MYLANTA) 200-200-20 MG/5ML suspension Take 30 mLs by mouth every 6 (six) hours as needed for indigestion or heartburn.    [provider]  atenolol (TENORMIN) 25 MG tablet Take 25 mg by mouth  daily.     [provider]  bismuth subsalicylate (PEPTO-BISMOL) 262 MG/15ML suspension Take 15 mLs by mouth every 6 (six) hours as needed.    [provider]  feeding supplement (BOOST HIGH PROTEIN) LIQD Take 1 Container by mouth 2 (two) times daily between meals.    [provider]  morphine 10 MG/5ML solution Take 10 mg by mouth every 2 (two) hours as needed for moderate pain or severe pain.    [provider]  oxybutynin (DITROPAN) 5 MG tablet Take 5 mg by mouth 2 (two) times daily.    [provider]     Physical Exam: Vitals:   09/19/19 1910 09/19/19 1920 09/20/19 0100  BP: 113/85  (!) 171/83  Pulse: 72  80  Resp: 18  20  Temp: 97.7 F (36.5 C)    TempSrc: Oral    SpO2: 100%  97%  Weight:  51 kg   Height:  5\' 9"  (1.753 m)     Constitutional: NAD, calm, comfortable Vitals:   09/19/19 1910 09/19/19 1920 09/20/19 0100  BP: 113/85  (!) 171/83  Pulse: 72  80  Resp: 18  20  Temp: 97.7 F (36.5 C)    TempSrc: Oral    SpO2: 100%  97%  Weight:  51 kg   Height:  5\' 9"  (1.753 m)    General: WDWN, Alert and oriented to person and place.  Eyes: EOMI, PERRL, lids and conjunctivae normal.  Sclera nonicteric HENT:  [place]/AT, external ears normal.  Nares patent without epistasis.  Mucous membranes are dry. Posterior pharynx clear of any exudate or lesions. Normal dentition.  Neck: Soft, normal range of motion, supple, no masses, no thyromegaly.  Trachea midline Respiratory: clear to auscultation bilaterally, no wheezing, no crackles. Normal respiratory effort. No accessory muscle use.  Cardiovascular: Regular rate and rhythm, no murmurs / rubs / gallops. No extremity edema. 2+ pedal pulses.   Abdomen: Soft, suprapubic tenderness to palpation.  Nondistended, no rebound or guarding.  No masses palpated. No hepatosplenomegaly. Bowel sounds normoactive Musculoskeletal: FROM. no clubbing / cyanosis. Normal muscle tone.  Skin: Warm, dry, intact no rashes, lesions, ulcers. No induration Neurologic: CN 2-12 grossly intact.  Normal speech.  patella DTR +1 bilaterally. Strength 4/5 in all extremities.      Labs on Admission: I have personally reviewed following labs and imaging studies  CBC: Recent Labs  Lab 09/13/19 0409 09/14/19 0428 09/15/19 0548 09/19/19 2326  WBC 14.3* 11.8* 12.4* 17.1*  NEUTROABS  --  9.1* 8.7* 14.2*  HGB 11.6* 11.9* 11.2* 11.1*  HCT 34.5* 35.1* 32.9* 33.0*  MCV 89.6 88.9 90.1 91.4  PLT 257 255 251 341    Basic Metabolic Panel: Recent Labs  Lab  09/13/19 0409 09/13/19 0449 09/14/19 0428 09/15/19 0548 09/19/19 2326  NA 136  --  137 134* 134*  K 3.5  --  3.4* 4.4 4.2  CL 98  --  98 97* 99  CO2 28  --  30 29 28   GLUCOSE 161*  --  105* 124* 216*  BUN 26*  --  24* 29* 21  CREATININE 0.88  --  0.75 0.86 0.74  CALCIUM 11.1*  --  11.1* 10.6* 10.1  MG  --  1.9  --  1.9  --     GFR: Estimated Creatinine Clearance: 36.9 mL/min (by C-G formula based on SCr of 0.74 mg/dL).  Liver Function Tests: Recent Labs  Lab 09/13/19 0409  AST 47*  ALT 38  ALKPHOS 82  BILITOT 1.1  PROT 7.1  ALBUMIN 3.9    Urine analysis:    Component Value Date/Time   COLORURINE YELLOW (A) 09/19/2019 1947   APPEARANCEUR HAZY (A) 09/19/2019 1947   APPEARANCEUR Clear 01/09/2013 1900   LABSPEC 1.017 09/19/2019 1947   LABSPEC 1.016 01/09/2013 1900   PHURINE 6.0 09/19/2019 1947   GLUCOSEU >=500 (A) 09/19/2019 1947   GLUCOSEU >=500 01/09/2013 1900   HGBUR LARGE (A) 09/19/2019 1947   BILIRUBINUR NEGATIVE 09/19/2019 1947   BILIRUBINUR Negative 01/09/2013 1900   KETONESUR NEGATIVE 09/19/2019 1947   PROTEINUR NEGATIVE 09/19/2019 1947   NITRITE NEGATIVE 09/19/2019 1947   LEUKOCYTESUR TRACE (A) 09/19/2019 1947   LEUKOCYTESUR 1+ 01/09/2013 1900    Radiological Exams on Admission: CT Head Wo Contrast  Result Date: 09/19/2019 CLINICAL DATA:  Fall EXAM: CT HEAD WITHOUT CONTRAST TECHNIQUE: Contiguous axial images were obtained from the base of the skull through the vertex without intravenous contrast. COMPARISON:  09/12/2019 FINDINGS: Brain: There is atrophy and chronic small vessel disease changes. No acute intracranial abnormality. Specifically, no hemorrhage, hydrocephalus, mass lesion, acute infarction, or significant intracranial injury. Vascular: No hyperdense vessel or unexpected calcification. Skull: No acute calvarial abnormality. Sinuses/Orbits: Visualized paranasal sinuses and mastoids clear. Orbital soft tissues unremarkable. Other: None IMPRESSION:  Atrophy, chronic microvascular disease. No acute intracranial abnormality. Electronically Signed   By: Charlett Nose M.D.   On: 09/19/2019 20:14   CT Cervical Spine Wo Contrast  Result Date: 09/19/2019 CLINICAL DATA:  Right-sided neck pain after unwitnessed fall earlier today. EXAM: CT CERVICAL SPINE WITHOUT CONTRAST TECHNIQUE: Multidetector CT imaging of the cervical spine was performed without intravenous contrast. Multiplanar CT image reconstructions were also generated. COMPARISON:  09/12/2019 FINDINGS: Alignment: There is reversal of the usual cervical lordosis with slight anterior subluxation of C3 on C4. This appearance is unchanged since the previous study and likely represents degenerative change. C1-2 articulation appears intact. Skull base and vertebrae: No vertebral compression deformities. No focal bone lesion or bone destruction. Bone cortex appears intact. Posterior elements and skull base appear intact. Soft tissues and spinal canal: No prevertebral soft tissue swelling. No abnormal paraspinal soft tissue mass or infiltration. Disc levels: Degenerative changes throughout the cervical spine with narrowed interspaces and endplate hypertrophic change. Prominent degenerative changes in the posterior elements. Ligamentous calcification at C1-2. Upper chest: A small right apical pneumothorax is again demonstrated, possibly increased since the previous study. Other: None. IMPRESSION: 1. Degenerative changes throughout the cervical spine. Reversal of the usual cervical lordosis with slight anterior subluxation of C3 on C4. No acute displaced fractures identified. 2. Small right apical pneumothorax is again demonstrated, possibly increased since previous study. Electronically Signed   By: Burman Nieves M.D.   On: 09/19/2019 23:20   DG Chest Portable 1 View  Result Date: 09/19/2019 CLINICAL DATA:  Chest pain after fall EXAM: PORTABLE CHEST 1 VIEW COMPARISON:  09/14/2019 FINDINGS: Single frontal view of  the chest demonstrates a stable cardiac silhouette. No airspace disease or effusion. The trace right apical pneumothorax seen on prior examination is unchanged. Multiple displaced right posterolateral rib fractures are again noted, stable. Resolution of the subcutaneous gas seen previously. No new bony abnormalities. IMPRESSION: 1. Stable right posterolateral rib fractures and trace right apical pneumothorax. No new process. Electronically Signed   By: Sharlet Salina M.D.   On: 09/19/2019 22:41    EKG: Independently reviewed.  EKG shows normal sinus rhythm with no acute ST elevation or depression.  QTc 408  Assessment/Plan Principal Problem:   Sepsis secondary to UTI [facility](HCC) Ms. [name] is admitted to medical surgical floor with sepsis secondary to urinary tract infection.  She has elevated white blood cell count and lactic acid level.  She has been given IV fluids in the emergency room. We will recheck lactic acid level in a few hours Patient started on Rocephin for antibiotic coverage.  Blood and urine cultures ordered the emergency room and will be monitored. IV fluid hydration provided.  Active Problems:   Diabetes mellitus without complication (HCC) Monitor blood sugars before every meal and nightly.  SSI provided for glycemic control.  Hemoglobin C was done last month so would not be repeated at this time.  Was less than 7    Protein-calorie malnutrition, severe Chronic.  Encourage p.o. intake.  Dietary consult in the morning    Dementia without behavioral disturbance (HCC) Chronic and stable    Pneumothorax, closed, traumatic Stable from previous chest x-ray from a fall last month.    DNR (do not resuscitate) Plan:      DVT prophylaxis: Padua score elevated.  Lovenox for DVT prophylaxis Code Status:   DNR.  Patient verbalizes that she does not wish to be resuscitated and is made DNR paperwork that is at the SNF Family Communication:  No family at bedside Disposition Plan:    Patient is from:  SNF  Anticipated DC to:  SNF  Anticipated DC date:  Anticipate 2 midnight stay to treat acute medical condition  Anticipated DC barriers: No barriers to discharge identified at this time   Admission status:  Inpatient  Severity of Illness: The appropriate patient status for this patient is INPATIENT. Inpatient status is judged to be reasonable and necessary in order to provide the required intensity of service to ensure the patient's safety. The patient's presenting symptoms, physical exam findings, and initial radiographic and laboratory data in the context of their chronic comorbidities is felt to place them at high risk for further clinical deterioration. Furthermore, it is not anticipated that the patient will be medically stable for discharge from the hospital within 2 midnights of admission. The following factors support the patient status of inpatient.  Rhett BannisterGravida   * I certify that at the point of admission it is my clinical judgment that the patient will require inpatient hospital care spanning beyond 2 midnights from the point of admission due to high intensity of service, high risk for further deterioration and high frequency of surveillance required.*  You know I saw  Carlton AdamBradley S Jalene Lacko MD [facility]  How to contact the [facility] Attending or Consulting provider 7A - 7P or covering provider during after hours 7P -7A, for this patient?   1. Check the care team in [facility] and look for a) attending/consulting [facility] provider listed and b) the [facility] team listed 2. Log into [email or web address] and use [place]'s universal password to access. If you do not have the password, please contact the hospital operator. 3. Locate the [facility] provider you are looking for under [facility] and page to a number that you can be directly reached. 4. If you still have difficulty reaching the provider, please page the [facility] (Director on Call) for the Hospitalists listed on amion for  assistance.  09/20/2019, 3:36 AM

## 2019-09-21 ENCOUNTER — Inpatient Hospital Stay: Payer: Medicare Other

## 2019-09-21 ENCOUNTER — Other Ambulatory Visit: Payer: Self-pay

## 2019-09-21 DIAGNOSIS — W19XXXA Unspecified fall, initial encounter: Secondary | ICD-10-CM

## 2019-09-21 DIAGNOSIS — R945 Abnormal results of liver function studies: Secondary | ICD-10-CM

## 2019-09-21 LAB — CBC WITH DIFFERENTIAL/PLATELET
Abs Immature Granulocytes: 0.06 10*3/uL (ref 0.00–0.07)
Basophils Absolute: 0 10*3/uL (ref 0.0–0.1)
Basophils Relative: 0 %
Eosinophils Absolute: 0.4 10*3/uL (ref 0.0–0.5)
Eosinophils Relative: 4 %
HCT: 29.7 % — ABNORMAL LOW (ref 36.0–46.0)
Hemoglobin: 10 g/dL — ABNORMAL LOW (ref 12.0–15.0)
Immature Granulocytes: 1 %
Lymphocytes Relative: 15 %
Lymphs Abs: 1.4 10*3/uL (ref 0.7–4.0)
MCH: 30.9 pg (ref 26.0–34.0)
MCHC: 33.7 g/dL (ref 30.0–36.0)
MCV: 91.7 fL (ref 80.0–100.0)
Monocytes Absolute: 0.8 10*3/uL (ref 0.1–1.0)
Monocytes Relative: 9 %
Neutro Abs: 6.6 10*3/uL (ref 1.7–7.7)
Neutrophils Relative %: 71 %
Platelets: 282 10*3/uL (ref 150–400)
RBC: 3.24 MIL/uL — ABNORMAL LOW (ref 3.87–5.11)
RDW: 16.2 % — ABNORMAL HIGH (ref 11.5–15.5)
WBC: 9.3 10*3/uL (ref 4.0–10.5)
nRBC: 0 % (ref 0.0–0.2)

## 2019-09-21 LAB — COMPREHENSIVE METABOLIC PANEL WITH GFR
ALT: 67 U/L — ABNORMAL HIGH (ref 0–44)
AST: 56 U/L — ABNORMAL HIGH (ref 15–41)
Albumin: 3 g/dL — ABNORMAL LOW (ref 3.5–5.0)
Alkaline Phosphatase: 156 U/L — ABNORMAL HIGH (ref 38–126)
Anion gap: 9 (ref 5–15)
BUN: 16 mg/dL (ref 8–23)
CO2: 27 mmol/L (ref 22–32)
Calcium: 9.3 mg/dL (ref 8.9–10.3)
Chloride: 97 mmol/L — ABNORMAL LOW (ref 98–111)
Creatinine, Ser: 0.59 mg/dL (ref 0.44–1.00)
GFR calc Af Amer: >60 mL/min
GFR calc non Af Amer: >60 mL/min
Glucose, Bld: 160 mg/dL — ABNORMAL HIGH (ref 70–99)
Potassium: 3.8 mmol/L (ref 3.5–5.1)
Sodium: 133 mmol/L — ABNORMAL LOW (ref 135–145)
Total Bilirubin: 0.8 mg/dL (ref 0.3–1.2)
Total Protein: 5.8 g/dL — ABNORMAL LOW (ref 6.5–8.1)

## 2019-09-21 LAB — URINE CULTURE: Culture: >=100000 — AB

## 2019-09-21 LAB — GLUCOSE, CAPILLARY
Glucose-Capillary: 132 mg/dL — ABNORMAL HIGH (ref 70–99)
Glucose-Capillary: 250 mg/dL — ABNORMAL HIGH (ref 70–99)
Glucose-Capillary: 189 mg/dL — ABNORMAL HIGH (ref 70–99)
Glucose-Capillary: 193 mg/dL — ABNORMAL HIGH (ref 70–99)

## 2019-09-21 LAB — MAGNESIUM: Magnesium: 1.5 mg/dL — ABNORMAL LOW (ref 1.7–2.4)

## 2019-09-21 LAB — LACTIC ACID, PLASMA: Lactic Acid, Venous: 2.4 mmol/L (ref 0.5–1.9)

## 2019-09-21 LAB — PHOSPHORUS: Phosphorus: 2.3 mg/dL — ABNORMAL LOW (ref 2.5–4.6)

## 2019-09-21 MED ORDER — BISACODYL 10 MG RE SUPP: 10.0000 mg | Freq: Every day | RECTAL | Status: DC | PRN

## 2019-09-21 MED ORDER — SODIUM PHOSPHATES 45 MMOLE/15ML IV SOLN
10.0000 mmol | Freq: Once | INTRAVENOUS | Status: AC
  Administered 2019-09-21: 10 mmol via INTRAVENOUS
  Filled 2019-09-21: qty 3.33

## 2019-09-21 MED ORDER — MORPHINE SULFATE 10 MG/5ML PO SOLN: 10.0000 mg | ORAL | Status: DC | PRN

## 2019-09-21 MED ORDER — POLYETHYLENE GLYCOL 3350 17 G PO PACK
17.0000 g | PACK | Freq: Two times a day (BID) | ORAL | Status: DC
  Administered 2019-09-21 – 2019-09-24 (×7): 17 g via ORAL
  Filled 2019-09-21 (×7): qty 1

## 2019-09-21 MED ORDER — MAGNESIUM SULFATE 2 GM/50ML IV SOLN
2.0000 g | Freq: Once | INTRAVENOUS | Status: AC
  Administered 2019-09-21: 2 g via INTRAVENOUS
  Filled 2019-09-21: qty 50

## 2019-09-21 MED ORDER — SENNOSIDES-DOCUSATE SODIUM 8.6-50 MG PO TABS
1.0000 | ORAL_TABLET | Freq: Two times a day (BID) | ORAL | Status: DC
  Administered 2019-09-21 – 2019-09-24 (×7): 1 via ORAL
  Filled 2019-09-21 (×7): qty 1

## 2019-09-21 MED ORDER — SODIUM CHLORIDE 0.9 % IV BOLUS
500.0000 mL | Freq: Once | INTRAVENOUS | Status: AC
  Administered 2019-09-21: 500 mL via INTRAVENOUS

## 2019-09-21 MED ORDER — SODIUM CHLORIDE 0.9 % IV SOLN: INTRAVENOUS | Status: DC

## 2019-09-21 MED ORDER — VANCOMYCIN HCL IN DEXTROSE 1-5 GM/200ML-% IV SOLN
1000.0000 mg | Freq: Once | INTRAVENOUS | Status: AC
  Administered 2019-09-21: 1000 mg via INTRAVENOUS
  Filled 2019-09-21: qty 200

## 2019-09-21 MED ORDER — VANCOMYCIN HCL 500 MG/100ML IV SOLN
500.0000 mg | INTRAVENOUS | Status: DC
  Administered 2019-09-22 – 2019-09-24 (×3): 500 mg via INTRAVENOUS
  Filled 2019-09-21 (×4): qty 100

## 2019-09-21 NOTE — Progress Notes (Signed)
Attempt made to call [name] the legal guardian and patients nephew Renae Fickle. Neither answered the phone.

## 2019-09-21 NOTE — Progress Notes (Signed)
Physical Therapy Treatment Patient Details Name: Jenna Robinson MRN: [id number] DOB: 1925-03-07 Today's Date: 09/21/2019    History of Present Illness 86 yo Female presents to hospital after sustaining a fall at her facility. She presents with elevated WBC and lactic acid indicating possible UTI with sepsis. Patient has PMH significant for HTN, DMx2, Alzheimers, Dementia, malnutrition; Recent CT (-) for acute abnormality;    PT Comments    Pt ready for session.  OOB with min/mod a x 1.  Stood with min/mod a x 1 to IV pole.  PT notes stated yesterday she was able to walk 15' with min assist.  She is unsteady today falling backwards onto bed.  RW given and she is able to walk 10' to commode at end of bed.  Gait quality generally poor and min/mod a x 1.  Voided and she is able to walk to recliner with somewhat improved gait but quality remains poor.  Remained in recliner after session.  Pt asking frequently during session for food and that she is hungry.  Checked with RN and pt NPO for testing today.  Reviewed several times with pt during session.   Follow Up Recommendations  SNF;Supervision/Assistance - 24 hour     Equipment Recommendations       Recommendations for Other Services       Precautions / Restrictions Precautions Precautions: Fall Restrictions Weight Bearing Restrictions: No    Mobility  Bed Mobility Overal bed mobility: Needs Assistance Bed Mobility: Sit to Supine     Supine to sit: Min assist        Transfers Overall transfer level: Needs assistance Equipment used: Rolling walker (2 wheeled) Transfers: Sit to/from Stand Sit to Stand: Min assist;Mod assist            Ambulation/Gait Ambulation/Gait assistance: Min assist;Mod assist Gait Distance (Feet): 8 Feet Assistive device: Rolling walker (2 wheeled)   Gait velocity: decreased   General Gait Details: decreased balance today, unsafe pushing IV pole, trialed of RW but gait generally poor  to and from commode at end of bed.   Stairs             Wheelchair Mobility    Modified Rankin (Stroke Patients Only)       Balance Overall balance assessment: Needs assistance Sitting-balance support: Single extremity supported;Feet supported Sitting balance-Leahy Scale: Fair     Standing balance support: Bilateral upper extremity supported Standing balance-Leahy Scale: Poor Standing balance comment: requires min A for standing balance;                            Cognition Arousal/Alertness: Awake/alert Behavior During Therapy: Flat affect Overall Cognitive Status: No family/caregiver present to determine baseline cognitive functioning                                 General Comments: pt has history of dementia/Alzheimers; unable to determine baseline;      Exercises Other Exercises Other Exercises: to commode to void    General Comments        Pertinent Vitals/Pain Pain Assessment: Faces Faces Pain Scale: Hurts a little bit Pain Location: R UE pain Pain Descriptors / Indicators: Sore Pain Intervention(s): Limited activity within patient's tolerance;Monitored during session    Home Living  Prior Function            PT Goals (current goals can now be found in the care plan section) Progress towards PT goals: Progressing toward goals    Frequency    Min 2X/week      PT Plan Current plan remains appropriate    Co-evaluation              AM-PAC PT "6 Clicks" Mobility   Outcome Measure  Help needed turning from your back to your side while in a flat bed without using bedrails?: A Little Help needed moving from lying on your back to sitting on the side of a flat bed without using bedrails?: A Little Help needed moving to and from a bed to a chair (including a wheelchair)?: A Lot Help needed standing up from a chair using your arms (e.g., wheelchair or bedside chair)?: A Lot Help  needed to walk in hospital room?: A Lot Help needed climbing 3-5 steps with a railing? : Total 6 Click Score: 13    End of Session Equipment Utilized During Treatment: Gait belt Activity Tolerance: Patient tolerated treatment well;Patient limited by fatigue Patient left: in chair;with call bell/phone within reach;with chair alarm set Nurse Communication: Mobility status       Time: 3790-2409 PT Time Calculation (min) (ACUTE ONLY): 15 min  Charges:  $Gait Training: 8-22 mins                    Danielle Dess, PTA 09/21/19, 3:51 PM

## 2019-09-21 NOTE — Consult Note (Signed)
Pharmacy Antibiotic Note  Jenna Robinson is a 86 y.o. female admitted on 09/19/2019 with sepsis due to UTI.  Pharmacy has been consulted for vancomycin dosing. She was originally started on ceftriaxone but has since grown out Enterococcus in her urine. She has a noted allergy to penicillin with a reaction of rash and I was unable to determine the severity due to her dementia and lack of information available regarding the reaction.  Plan: start vancomycin 1000 mg IV x 1 then 500 mg IV Q 24 hrs  Ke: 0.035 h-1, T1/2: 19.8 h  Css (calculated): 24.0/10.5 mcg/mL  Daily SCr while on vancomycin to assess renal function  vancomycin levels as clinically needed   Height: 5\' 9"  (175.3 cm) Weight: 51 kg (112 lb 7 oz) IBW/kg (Calculated) : 66.2  Temp (24hrs), Avg:98 F (36.7 C), Min:97.3 F (36.3 C), Max:98.6 F (37 C)  Recent Labs  Lab 09/15/19 0548 09/19/19 2326 09/20/19 0042 09/20/19 0518 09/20/19 0521 09/21/19 0635 09/21/19 1012  WBC 12.4* 17.1*  --   --  12.0* 9.3  --   CREATININE 0.86 0.74  --   --  0.73 0.59  --   LATICACIDVEN  --   --  2.7* 2.3*  --   --  2.4*    Estimated Creatinine Clearance: 36.9 mL/min (by C-G formula based on SCr of 0.59 mg/dL).    Allergies  Allergen Reactions  . Penicillins Itching    Did it involve swelling of the face/tongue/throat, SOB, or low BP? No Did it involve sudden or severe rash/hives, skin peeling, or any reaction on the inside of your mouth or nose? Yes Did you need to seek medical attention at a hospital or doctor's office? No When did it last happen? If all above answers are "NO", may proceed with cephalosporin use.    Antimicrobials this admission: 9/8 ceftriaxone >> 9/9 vancomycin 9/9 >>   Microbiology results: 9/8 BCx: NG x 1 day 9/7 UCx: E faecalis (pan-sensitive)  9/8 SARS CoV-2: negative  9/8 MRSA PCR: negative  Thank you for allowing pharmacy to be a part of this patient's care.  11/8 09/21/2019  2:04 PM

## 2019-09-21 NOTE — Progress Notes (Signed)
PROGRESS NOTE    Jolleen Seman  HBZ:[id number] DOB: 17-Nov-1925 DOA: 09/19/2019 PCP: Elba Barman, MD   Brief Narrative:  The patient is a elderly 86 year old Caucasian female with a past medical history significant for but not limited to diabetes mellitus type 2, hypertension, Alzheimer's dementia as well as other comorbidities including hypercholesterolemia, hypertension, hypothyroidism who was found on the ground and had a unwitnessed fall at her facility.  She did not complain of any headache, neck pain or back pain in her extremities but she did report to have some abdominal pain when questioned examined.  She does have a history of UTIs in the past and has had a fall with remote fracture last month with a small apical pneumothorax on chest x-ray.  In the ED she is found to have elevated elevated lactic acid level, elevated WBC of 17,000 and UA consistent with UTI.  She is empirically started on IV ceftriaxone and her small apical pneumothorax on chest x-ray was unchanged.  The dietitian was consulted and have severe malnutrition in the context of chronic illness.  PT OT will also be evaluating  **Interim History Patient does have UTI and grew out pansensitive Enterococcus faecalis so antibiotics were switched to IV vancomycin even her penicillin allergy.  BBC is improved and went from 12.0 is now 9.3   Assessment & Plan:   Principal Problem:   UTI (urinary tract infection) Active Problems:   Dementia without behavioral disturbance (HCC)   Diabetes mellitus without complication (HCC)   Protein-calorie malnutrition, severe   Pneumothorax, closed, traumatic   DNR (do not resuscitate)   Fall  Acute Enterococcus faecalis UTI ([place]); Sepsis ruled out -Ms. Fetsch was admitted to medical surgical floor with what originally was thought to be sepsis secondary to urinary tract infection.  -On admission She has elevated white blood cell count of 17,100 and lactic acid level but was  afebrile, had No tachycardia, or tachypenia; LA elevation could have been in the setting of ? Dehydration; She only met 1 of the SIRS Criteria -urinalysis showed hazy appearance with yellow color urine, greater than 500 glucose, large hemoglobin, trace leukocytes, negative nitrites, rare bacteria, greater than 50 RBCs per high-power field, and 6-10 WBCs with urine culture showing pansensitive Enterococcus faecalis to nitrofurantoin, ampicillin, and vancomycin -PCT was <0.10 -She has been given IV fluids in the emergency room with LR 1 Liter bolus and was given IVF with LR at 150 mL/hr but will change to 75 mL/hr and IV fluid hydration is now stopped -We will recheck lactic acid level i and it is slightly bumped to 2.4 so she will be given another 500 mL bolus and started on IV fluid hydration again with normal saline at 100 MLS per hour -Patient started on Rocephin for antibiotic coverage. Blood and urine cultures ordered the emergency room and will be monitored. -IV fluid hydration provided. -WBC is improving and went from 17.1 -> 12.0 -> 8.3 -Continue to Monitor Temperature Curve and WBC and follow up on Cx's   Acute Fall -In the setting of UTI -CT Head showed "Atrophy, chronic microvascular disease. No acute intracranial abnormality" -CT Cervical Spine showed "Degenerative changes throughout the cervical spine. Reversal of the usual cervical lordosis with slight anterior subluxation of C3 on C4. No acute displaced fractures identified. Small right apical pneumothorax is again demonstrated, possibly increased since previous study." -PT OT recommending SNF -Check a right shoulder x-ray given that she is complaining of right shoulder pain; shoulder x-ray showed  degenerative changes with changes consistent with chronic rotator cuff injury and there is multiple rib fractures noted on the right side; chest x-ray this morning showed stable right rib fractures with small right apical pneumothorax    Diabetes mellitus without complication (HCC) -Monitor blood sugars before every meal and nightly.  -Sensitive Novolog SSI AC/HS provided for glycemic control.  -Hemoglobin A1c was done last month so would not be repeated at this time. Was less than 7 -CBGs ranging from 170-250  Protein-calorie malnutrition, severe -Chronic. Encourage p.o. intake.  -Dietary consult in the morning -Continue with Ensure Enlive p.o. twice daily as well as Magic cup 3 times daily with meals and a dysphagia 3 diet  Dementia without behavioral disturbance (HCC) -Chronic and stable  Pneumothorax, closed, traumatic -Stable from previous chest x-ray from a fall last month. -CTA shows Possibly increased size since last study -C/w Serial CXR and Monitoring of Respiratory Status; repeat chest x-ray this morning showed stability  HTN -C/w Atenolol 25 mg po Daily   Hyponatremia -Mild -Na+ went from 134 -> 133 and is 133 again -Currently getting LR; May change to NS if continues to [place] -We will replete with IV sodium phosphate 10 mmol -Repeat CMP in the AM  Normocytic Anemia -Hgb/Hct went from 11.1/33.0 -> 9.4/28.3 today is 10.0/29.7 -Check Anemia Panel in the AM -Continue to Monitor for S/Sx of Bleeding; No overt bleeding currently noted -Repeat CBC in the AM   Hypomagnesemia -Patient magnesium levels 1.5 -Replete with IV mag sulfate 2 g  -Continue to monitor and replete as necessary -Repeat magnesium level in a.m.  Hypophosphatemia -Patient's phosphorus level this morning was 2.3 -Replete with IV sodium phosphate 10 mmol -Continue monitor replete as necessary -Repeat phosphorus level in a.m.  Abnormal LFTs -Mild with an AST of 56 and ALT of 60 7 -Resume IV fluid hydration -Check right upper quadrant abdominal ultrasound and this is currently pending -Continue monitor and trend hepatic function panel repeat CMP in a.m. as well as an acute hepatitis panel  GOC: DNR, poA  DVT  prophylaxis: Enoxaparin 40 mg subcu every 24 Code Status: DO NOT RESUSCITATE Family Communication: No family present at bedside  Disposition Plan: Pending further clinical improvement and anticipating discharging to SNF within the next 48 hours  Status is: Inpatient  Remains inpatient appropriate because:Ongoing diagnostic testing needed not appropriate for outpatient work up, IV treatments appropriate due to intensity of illness or inability to take PO and Inpatient level of care appropriate due to severity of illness   Dispo: The patient is from: SNF              Anticipated d/c is to: SNF              Anticipated d/c date is: 2 days              Patient currently is not medically stable to d/c.  Consultants:   None   Procedures:  Right upper quadrant ultrasound  Antimicrobials:  Anti-infectives (From admission, onward)   Start     Dose/Rate Route Frequency Ordered Stop   09/22/19 1200  vancomycin (VANCOREADY) IVPB 500 mg/100 mL        500 mg 100 mL/hr over 60 Minutes Intravenous Every 24 hours 09/21/19 1411     09/21/19 1600  vancomycin (VANCOCIN) IVPB 1000 mg/200 mL premix        1,000 mg 200 mL/hr over 60 Minutes Intravenous  Once 09/21/19 1411     09/20/19 0530  cefTRIAXone (ROCEPHIN) 1 g in sodium chloride 0.9 % 100 mL IVPB  Status:  Discontinued        1 g 200 mL/hr over 30 Minutes Intravenous Every 24 hours 09/20/19 0519 09/21/19 1411   09/20/19 0015  cefTRIAXone (ROCEPHIN) 1 g in sodium chloride 0.9 % 100 mL IVPB        1 g 200 mL/hr over 30 Minutes Intravenous  Once 09/20/19 0001 09/20/19 0110        Subjective: Seen and examined at bedside and she is resting and I woke her from sleep.  She had no complaints but was hungry.  No nausea or vomiting.  Earlier she was complaining about shoulder pain to the nurse behind the knee.  States that she has some pain on the right side occasionally.  No other concerns at this time.  Objective: Vitals:   09/20/19 1939  09/21/19 0507 09/21/19 0824 09/21/19 1146  BP: 128/77 120/68  121/62  Pulse: 80 62 74 68  Resp: 18 18  15   Temp: 98.6 F (37 C) 98.1 F (36.7 C)  (!) 97.3 F (36.3 C)  TempSrc: Oral Oral  Oral  SpO2: 96% 98%  100%  Weight:      Height:        Intake/Output Summary (Last 24 hours) at 09/21/2019 1836 Last data filed at 09/21/2019 1500 Gross per 24 hour  Intake 1706.3 ml  Output 375 ml  Net 1331.3 ml   Filed Weights   09/19/19 1920  Weight: 51 kg   Examination: Physical Exam:  Constitutional: Thin chronically ill-appearing elderly Caucasian female currently in NAD and appears calm and comfortable Eyes: Lids and conjunctivae normal, sclerae anicteric  ENMT: External Ears, Nose appear normal. Grossly normal hearing.  Has bruising on her forehead and over her left eye Neck: Appears normal, supple, no cervical masses, normal ROM, no appreciable thyromegaly, no JVD Respiratory: Diminished to auscultation bilaterally, no wheezing, rales, rhonchi or crackles. Normal respiratory effort and patient is not tachypenic. No accessory muscle use.  Unlabored breathing Cardiovascular: RRR, no murmurs / rubs / gallops. 2+ pedal pulses. No carotid bruits.  Abdomen: Soft, mildly tender, non-distended.  Bowel sounds positive.  GU: Deferred. Musculoskeletal: No clubbing / cyanosis of digits/nails. No joint deformity upper and lower extremities.  Skin: Has bruising on her forehead as well as on her left leg. No induration; Warm and dry.  Neurologic: CN 2-12 grossly intact with no focal deficits. Romberg sign cerebellar reflexes not assessed.  Psychiatric: Impaired judgment and insight.  She is slightly somnolent and drowsy. Normal mood and appropriate affect.   Data Reviewed: I have personally reviewed following labs and imaging studies  CBC: Recent Labs  Lab 09/15/19 0548 09/19/19 2326 09/20/19 0521 09/21/19 0635  WBC 12.4* 17.1* 12.0* 9.3  NEUTROABS 8.7* 14.2*  --  6.6  HGB 11.2* 11.1* 9.4*  10.0*  HCT 32.9* 33.0* 28.3* 29.7*  MCV 90.1 91.4 92.2 91.7  PLT 251 341 231 536   Basic Metabolic Panel: Recent Labs  Lab 09/15/19 0548 09/19/19 2326 09/20/19 0521 09/21/19 0635  NA 134* 134* 133* 133*  K 4.4 4.2 4.1 3.8  CL 97* 99 98 97*  CO2 29 28 24 27   GLUCOSE 124* 216* 221* 160*  BUN 29* 21 20 16   CREATININE 0.86 0.74 0.73 0.59  CALCIUM 10.6* 10.1 9.5 9.3  MG 1.9  --   --  1.5*  PHOS  --   --   --  2.3*  GFR: Estimated Creatinine Clearance: 36.9 mL/min (by C-G formula based on SCr of 0.59 mg/dL). Liver Function Tests: Recent Labs  Lab 09/21/19 0635  AST 56*  ALT 67*  ALKPHOS 156*  BILITOT 0.8  PROT 5.8*  ALBUMIN 3.0*   No results for input(s): LIPASE, AMYLASE in the last 168 hours. No results for input(s): AMMONIA in the last 168 hours. Coagulation Profile: Recent Labs  Lab 09/20/19 0521  INR 1.1   Cardiac Enzymes: No results for input(s): CKTOTAL, CKMB, CKMBINDEX, TROPONINI in the last 168 hours. BNP (last 3 results) No results for input(s): PROBNP in the last 8760 hours. HbA1C: No results for input(s): HGBA1C in the last 72 hours. CBG: Recent Labs  Lab 09/20/19 2114 09/21/19 0732 09/21/19 1157 09/21/19 1550 09/21/19 1644  GLUCAP 170* 132* 250* 189* 193*   Lipid Profile: No results for input(s): CHOL, HDL, LDLCALC, TRIG, CHOLHDL, LDLDIRECT in the last 72 hours. Thyroid Function Tests: No results for input(s): TSH, T4TOTAL, FREET4, T3FREE, THYROIDAB in the last 72 hours. Anemia Panel: No results for input(s): VITAMINB12, FOLATE, FERRITIN, TIBC, IRON, RETICCTPCT in the last 72 hours. Sepsis Labs: Recent Labs  Lab 09/20/19 0042 09/20/19 0518 09/20/19 0521 09/21/19 1012  PROCALCITON  --   --  <0.10  --   LATICACIDVEN 2.7* 2.3*  --  2.4*    Recent Results (from the past 240 hour(s))  SARS Coronavirus 2 by RT PCR (hospital order, performed in [facility] hospital lab) Nasopharyngeal Nasopharyngeal Swab     Status: None   Collection  Time: 09/12/19  4:47 PM   Specimen: Nasopharyngeal Swab  Result Value Ref Range Status   SARS Coronavirus 2 NEGATIVE NEGATIVE Final    Comment: (NOTE) SARS-CoV-2 target nucleic acids are NOT DETECTED.  The SARS-CoV-2 RNA is generally detectable in upper and lower respiratory specimens during the acute phase of infection. The lowest concentration of SARS-CoV-2 viral copies this assay can detect is 250 copies / mL. A negative result does not preclude SARS-CoV-2 infection and should not be used as the sole basis for treatment or other patient management decisions.  A negative result may occur with improper specimen collection / handling, submission of specimen other than nasopharyngeal swab, presence of viral mutation(s) within the areas targeted by this assay, and inadequate number of viral copies (<250 copies / mL). A negative result must be combined with clinical observations, patient history, and epidemiological information.  Fact Sheet for Patients:   StrictlyIdeas.no  Fact Sheet for Healthcare Providers: BankingDealers.co.za  This test is not yet approved or  cleared by the [place] FDA and has been authorized for detection and/or diagnosis of SARS-CoV-2 by FDA under an Emergency Use Authorization (EUA).  This EUA will remain in effect (meaning this test can be used) for the duration of the COVID-19 declaration under Section 564(b)(1) of the Act, 21 U.S.C. section 360bbb-3(b)(1), unless the authorization is terminated or revoked sooner.  Performed at [facility], [address] [place], [place] [address]   Urine culture     Status: Abnormal   Collection Time: 09/12/19  6:18 PM   Specimen: Urine, Random  Result Value Ref Range Status   Specimen Description   Final    URINE, RANDOM Performed at [facility], [address] [place], [place] [address]    Special Requests   Final    NONE Performed at  [facility], [address] [place], [place] [address]    Culture MULTIPLE SPECIES PRESENT, SUGGEST RECOLLECTION (  A)  Final   Report Status 09/13/2019 FINAL  Final  Urine Culture     Status: Abnormal   Collection Time: 09/19/19  7:47 PM   Specimen: Urine, Random  Result Value Ref Range Status   Specimen Description   Final    URINE, RANDOM Performed at [facility], [address] [place], [place] [address]    Special Requests   Final    NONE Performed at [facility], [address] [place], [place] [address]    Culture >=100,000 COLONIES/mL ENTEROCOCCUS FAECALIS (A)  Final   Report Status 09/21/2019 FINAL  Final   Organism ID, Bacteria ENTEROCOCCUS FAECALIS (A)  Final      Susceptibility   Enterococcus faecalis - MIC*    AMPICILLIN <=2 SENSITIVE Sensitive     NITROFURANTOIN <=16 SENSITIVE Sensitive     VANCOMYCIN 1 SENSITIVE Sensitive     * >=100,000 COLONIES/mL ENTEROCOCCUS FAECALIS  Blood culture (routine x 2)     Status: None (Preliminary result)   Collection Time: 09/20/19 12:42 AM   Specimen: BLOOD  Result Value Ref Range Status   Specimen Description BLOOD RIGHT ARM  Final   Special Requests   Final    BOTTLES DRAWN AEROBIC AND ANAEROBIC Blood Culture results may not be optimal due to an inadequate volume of blood received in culture bottles   Culture   Final    NO GROWTH 1 DAY Performed at [facility], [address], [place], [place] [address]    Report Status PENDING  Incomplete  Blood culture (routine x 2)     Status: None (Preliminary result)   Collection Time: 09/20/19 12:42 AM   Specimen: BLOOD  Result Value Ref Range Status   Specimen Description BLOOD RIGHT ARM  Final   Special Requests   Final    BOTTLES DRAWN AEROBIC AND ANAEROBIC Blood Culture adequate volume   Culture   Final    NO GROWTH 1 DAY Performed at [facility], [address], [place], [place] [address]    Report Status PENDING   Incomplete  SARS Coronavirus 2 by RT PCR (hospital order, performed in [place] hospital lab) Nasopharyngeal Nasopharyngeal Swab     Status: None   Collection Time: 09/20/19  6:19 AM   Specimen: Nasopharyngeal Swab  Result Value Ref Range Status   SARS Coronavirus 2 NEGATIVE NEGATIVE Final    Comment: (NOTE) SARS-CoV-2 target nucleic acids are NOT DETECTED.  The SARS-CoV-2 RNA is generally detectable in upper and lower respiratory specimens during the acute phase of infection. The lowest concentration of SARS-CoV-2 viral copies this assay can detect is 250 copies / mL. A negative result does not preclude SARS-CoV-2 infection and should not be used as the sole basis for treatment or other patient management decisions.  A negative result may occur with improper specimen collection / handling, submission of specimen other than nasopharyngeal swab, presence of viral mutation(s) within the areas targeted by this assay, and inadequate number of viral copies (<250 copies / mL). A negative result must be combined with clinical observations, patient history, and epidemiological information.  Fact Sheet for Patients:   StrictlyIdeas.no  Fact Sheet for Healthcare Providers: BankingDealers.co.za  This test is not yet approved or  cleared by the [place] FDA and has been authorized for detection and/or diagnosis of SARS-CoV-2 by FDA under an Emergency Use Authorization (EUA).  This EUA will remain in effect (meaning this test can be used) for the duration of  the COVID-19 declaration under Section 564(b)(1) of the Act, 21 U.S.C. section 360bbb-3(b)(1), unless the authorization is terminated or revoked sooner.  Performed at [facility], [address] [place], [place] [address]   MRSA PCR Screening     Status: None   Collection Time: 09/20/19  2:51 PM   Specimen: Nasopharyngeal  Result Value Ref Range Status   MRSA by PCR  NEGATIVE NEGATIVE Final    Comment:        The GeneXpert MRSA Assay (FDA approved for NASAL specimens only), is one component of a comprehensive MRSA colonization surveillance program. It is not intended to diagnose MRSA infection nor to guide or monitor treatment for MRSA infections. Performed at [facility], [address] [place], [place] [address]      RN Pressure Injury Documentation:     Estimated body mass index is 16.6 kg/m as calculated from the following:   Height as of this encounter: 5' 9"  (1.753 m).   Weight as of this encounter: 51 kg.  Malnutrition Type:  Nutrition Problem: Severe Malnutrition Etiology: chronic illness (dementia)   Malnutrition Characteristics:  Signs/Symptoms: severe fat depletion, severe muscle depletion   Nutrition Interventions:  Interventions: Ensure Enlive (each supplement provides 350kcal and 20 grams of protein), Liberalize Diet     Radiology Studies: DG Chest 1 View  Result Date: 09/21/2019 CLINICAL DATA:  Follow-up pneumothorax EXAM: CHEST  1 VIEW COMPARISON:  09/19/2019 FINDINGS: Cardiac shadow is stable. Aortic calcifications are again seen. Multiple right-sided rib fractures are again identified and stable. Minimal right apical pneumothorax is noted stable from the prior exam given some change in the patient positioning. Small right-sided pleural effusion is noted. No focal infiltrate is seen. No other focal abnormality is noted. IMPRESSION: Stable right rib fractures with small right apical pneumothorax. Electronically Signed   By: Inez Catalina M.D.   On: 09/21/2019 09:38   DG Shoulder Right  Result Date: 09/21/2019 CLINICAL DATA:  Recent fall with right shoulder pain, initial encounter EXAM: RIGHT SHOULDER - 2+ VIEW COMPARISON:  None. FINDINGS: Multiple right-sided rib fractures are again identified. The apical pneumothorax is not as well appreciated on this exam. There are degenerative changes of the glenohumeral  joint as well as high-riding humeral head consistent with underlying chronic rotator cuff injury. No other focal abnormality is noted. IMPRESSION: Degenerative changes with changes consistent with chronic rotator cuff injury. Multiple right rib fractures. Electronically Signed   By: Inez Catalina M.D.   On: 09/21/2019 09:39   CT Head Wo Contrast  Result Date: 09/19/2019 CLINICAL DATA:  Fall EXAM: CT HEAD WITHOUT CONTRAST TECHNIQUE: Contiguous axial images were obtained from the base of the skull through the vertex without intravenous contrast. COMPARISON:  09/12/2019 FINDINGS: Brain: There is atrophy and chronic small vessel disease changes. No acute intracranial abnormality. Specifically, no hemorrhage, hydrocephalus, mass lesion, acute infarction, or significant intracranial injury. Vascular: No hyperdense vessel or unexpected calcification. Skull: No acute calvarial abnormality. Sinuses/Orbits: Visualized paranasal sinuses and mastoids clear. Orbital soft tissues unremarkable. Other: None IMPRESSION: Atrophy, chronic microvascular disease. No acute intracranial abnormality. Electronically Signed   By: Rolm Baptise M.D.   On: 09/19/2019 20:14   CT Cervical Spine Wo Contrast  Result Date: 09/19/2019 CLINICAL DATA:  Right-sided neck pain after unwitnessed fall earlier today. EXAM: CT CERVICAL SPINE WITHOUT CONTRAST TECHNIQUE: Multidetector CT imaging of the cervical spine was performed without intravenous contrast. Multiplanar CT image reconstructions were also generated. COMPARISON:  09/12/2019 FINDINGS: Alignment: There is  reversal of the usual cervical lordosis with slight anterior subluxation of C3 on C4. This appearance is unchanged since the previous study and likely represents degenerative change. C1-2 articulation appears intact. Skull base and vertebrae: No vertebral compression deformities. No focal bone lesion or bone destruction. Bone cortex appears intact. Posterior elements and skull base appear  intact. Soft tissues and spinal canal: No prevertebral soft tissue swelling. No abnormal paraspinal soft tissue mass or infiltration. Disc levels: Degenerative changes throughout the cervical spine with narrowed interspaces and endplate hypertrophic change. Prominent degenerative changes in the posterior elements. Ligamentous calcification at C1-2. Upper chest: A small right apical pneumothorax is again demonstrated, possibly increased since the previous study. Other: None. IMPRESSION: 1. Degenerative changes throughout the cervical spine. Reversal of the usual cervical lordosis with slight anterior subluxation of C3 on C4. No acute displaced fractures identified. 2. Small right apical pneumothorax is again demonstrated, possibly increased since previous study. Electronically Signed   By: Lucienne Capers M.D.   On: 09/19/2019 23:20   DG Chest Portable 1 View  Result Date: 09/19/2019 CLINICAL DATA:  Chest pain after fall EXAM: PORTABLE CHEST 1 VIEW COMPARISON:  09/14/2019 FINDINGS: Single frontal view of the chest demonstrates a stable cardiac silhouette. No airspace disease or effusion. The trace right apical pneumothorax seen on prior examination is unchanged. Multiple displaced right posterolateral rib fractures are again noted, stable. Resolution of the subcutaneous gas seen previously. No new bony abnormalities. IMPRESSION: 1. Stable right posterolateral rib fractures and trace right apical pneumothorax. No new process. Electronically Signed   By: Randa Ngo M.D.   On: 09/19/2019 22:41   Scheduled Meds:  aspirin EC  81 mg Oral Daily   atenolol  25 mg Oral Daily   enoxaparin (LOVENOX) injection  40 mg Subcutaneous Q24H   feeding supplement (ENSURE ENLIVE)  237 mL Oral BID BM   insulin aspart  0-5 Units Subcutaneous QHS   insulin aspart  0-9 Units Subcutaneous TID WC   mouth rinse  15 mL Mouth Rinse BID   oxybutynin  5 mg Oral BID   polyethylene glycol  17 g Oral BID   senna-docusate   1 tablet Oral BID   Continuous Infusions:  lactated ringers Stopped (09/21/19 1134)   vancomycin 1,000 mg (09/21/19 1818)   [START ON 09/22/2019] vancomycin      LOS: 1 day   Kerney Elbe, DO [facility] PAGER is on AMION  If 7PM-7AM, please contact night-coverage [email or web address]

## 2019-09-22 DIAGNOSIS — B952 Enterococcus as the cause of diseases classified elsewhere: Secondary | ICD-10-CM

## 2019-09-22 LAB — GLUCOSE, CAPILLARY
Glucose-Capillary: 120 mg/dL — ABNORMAL HIGH (ref 70–99)
Glucose-Capillary: 155 mg/dL — ABNORMAL HIGH (ref 70–99)
Glucose-Capillary: 303 mg/dL — ABNORMAL HIGH (ref 70–99)
Glucose-Capillary: 114 mg/dL — ABNORMAL HIGH (ref 70–99)

## 2019-09-22 LAB — COMPREHENSIVE METABOLIC PANEL
ALT: 78 U/L — ABNORMAL HIGH (ref 0–44)
AST: 71 U/L — ABNORMAL HIGH (ref 15–41)
Albumin: 2.7 g/dL — ABNORMAL LOW (ref 3.5–5.0)
Alkaline Phosphatase: 166 U/L — ABNORMAL HIGH (ref 38–126)
Anion gap: 7 (ref 5–15)
BUN: 16 mg/dL (ref 8–23)
CO2: 26 mmol/L (ref 22–32)
Calcium: 8.7 mg/dL — ABNORMAL LOW (ref 8.9–10.3)
Chloride: 101 mmol/L (ref 98–111)
Creatinine, Ser: 0.58 mg/dL (ref 0.44–1.00)
GFR calc Af Amer: >60 mL/min (ref >60)
GFR calc non Af Amer: >60 mL/min (ref >60)
Glucose, Bld: 170 mg/dL — ABNORMAL HIGH (ref 70–99)
Potassium: 3.9 mmol/L (ref 3.5–5.1)
Sodium: 134 mmol/L — ABNORMAL LOW (ref 135–145)
Total Bilirubin: 0.7 mg/dL (ref 0.3–1.2)
Total Protein: 5.3 g/dL — ABNORMAL LOW (ref 6.5–8.1)

## 2019-09-22 LAB — CBC WITH DIFFERENTIAL/PLATELET
Abs Immature Granulocytes: 0.08 10*3/uL — ABNORMAL HIGH (ref 0.00–0.07)
Basophils Absolute: 0 10*3/uL (ref 0.0–0.1)
Basophils Relative: 0 %
Eosinophils Absolute: 0.4 10*3/uL (ref 0.0–0.5)
Eosinophils Relative: 4 %
HCT: 26.8 % — ABNORMAL LOW (ref 36.0–46.0)
Hemoglobin: 9 g/dL — ABNORMAL LOW (ref 12.0–15.0)
Immature Granulocytes: 1 %
Lymphocytes Relative: 16 %
Lymphs Abs: 1.5 10*3/uL (ref 0.7–4.0)
MCH: 30.6 pg (ref 26.0–34.0)
MCHC: 33.6 g/dL (ref 30.0–36.0)
MCV: 91.2 fL (ref 80.0–100.0)
Monocytes Absolute: 0.9 10*3/uL (ref 0.1–1.0)
Monocytes Relative: 9 %
Neutro Abs: 6.7 10*3/uL (ref 1.7–7.7)
Neutrophils Relative %: 70 %
Platelets: 264 10*3/uL (ref 150–400)
RBC: 2.94 MIL/uL — ABNORMAL LOW (ref 3.87–5.11)
RDW: 16.4 % — ABNORMAL HIGH (ref 11.5–15.5)
WBC: 9.6 10*3/uL (ref 4.0–10.5)
nRBC: 0 % (ref 0.0–0.2)

## 2019-09-22 LAB — LACTIC ACID, PLASMA
Lactic Acid, Venous: 1.7 mmol/L (ref 0.5–1.9)
Lactic Acid, Venous: 1.9 mmol/L (ref 0.5–1.9)

## 2019-09-22 LAB — MAGNESIUM: Magnesium: 1.7 mg/dL (ref 1.7–2.4)

## 2019-09-22 LAB — PHOSPHORUS: Phosphorus: 2.5 mg/dL (ref 2.5–4.6)

## 2019-09-22 MED ORDER — MAGNESIUM SULFATE 2 GM/50ML IV SOLN
2.0000 g | Freq: Once | INTRAVENOUS | Status: AC
  Administered 2019-09-22: 2 g via INTRAVENOUS
  Filled 2019-09-22: qty 50

## 2019-09-22 NOTE — Plan of Care (Signed)
Continuing with plan of care. 

## 2019-09-22 NOTE — Progress Notes (Signed)
Physical Therapy Treatment Patient Details Name: Jenna Robinson MRN: [id number] DOB: 04-29-1925 Today's Date: 09/22/2019    History of Present Illness 86 yo Female presents to hospital after sustaining a fall at her facility. She presents with elevated WBC and lactic acid indicating possible UTI with sepsis. Patient has PMH significant for HTN, DMx2, Alzheimers, Dementia, malnutrition; Recent CT (-) for acute abnormality;    PT Comments    Pt was supine in bed with HOB elevated ~ 30 degrees upon arriving. She has baseline cognition deficits however was able to follow commands throughout. Upon entering room, pt request to use BR. She was able to progress from supine > sit without physical assistance however is impulsive and has poor insight of her deficits. Required lots of VCs for slowing down and safety overall. She stood and took several steps to [facility]. Urinated <100 ml and then returned to bed. Pt unwilling to ambulate further distances or perform exercises 2/2 " I want to take a nap now." she was repositioned in bed with bed alarm in place, call bell in reach and BLEs elevated. Acute PT recommends DC to SNF to address balance, strength, and overall safety deficits.     Follow Up Recommendations  SNF;Supervision/Assistance - 24 hour     Equipment Recommendations  Other (comment) (defer to next level of care)    Recommendations for Other Services       Precautions / Restrictions Precautions Precautions: Fall Restrictions Weight Bearing Restrictions: No    Mobility  Bed Mobility Overal bed mobility: Needs Assistance Bed Mobility: Supine to Sit;Sit to Supine     Supine to sit: Supervision Sit to supine: Supervision   General bed mobility comments: With bed flat, pt was able to exit L side of bed without physical assistance or vcs. Supervision for safety.  Transfers Overall transfer level: Needs assistance Equipment used: None Transfers: Sit to/from Stand Sit to  Stand: Min guard Stand pivot transfers: Min guard       General transfer comment: CGA for safety. pt did not require physical assistance however was cued throughout for safety and slowing down. pt has poor insight of deficits and impulsive. High fall risk.  A  General Gait Details: pt unwilling, did not want to ambulate after using BR. requested to take a nap.       Balance Overall balance assessment: Needs assistance Sitting-balance support: Feet supported Sitting balance-Leahy Scale: Fair Sitting balance - Comments: no LOB however is high fall risk 2/2 to cognition deficits   Standing balance support: Bilateral upper extremity supported Standing balance-Leahy Scale: Fair Standing balance comment: CGA for safety only.             Cognition Arousal/Alertness: Awake/alert Behavior During Therapy: Impulsive;Restless;Anxious Overall Cognitive Status: History of cognitive impairments - at baseline                  Pertinent Vitals/Pain Pain Assessment: No/denies pain Faces Pain Scale: No hurt Pain Intervention(s): Limited activity within patient's tolerance;Monitored during session;Repositioned           PT Goals (current goals can now be found in the care plan section) Acute Rehab PT Goals Patient Stated Goal: none stated Progress towards PT goals: Progressing toward goals    Frequency    Min 2X/week      PT Plan Current plan remains appropriate       AM-PAC PT "6 Clicks" Mobility   Outcome Measure  Help needed turning from your back to your side  while in a flat bed without using bedrails?: A Little Help needed moving from lying on your back to sitting on the side of a flat bed without using bedrails?: A Little Help needed moving to and from a bed to a chair (including a wheelchair)?: A Little Help needed standing up from a chair using your arms (e.g., wheelchair or bedside chair)?: A Little Help needed to walk in hospital room?: A Little Help needed  climbing 3-5 steps with a railing? : A Little 6 Click Score: 18    End of Session Equipment Utilized During Treatment: Gait belt Activity Tolerance: Patient tolerated treatment well;Other (comment) (limited by cognition and willing to cooperate) Patient left: in bed;with call bell/phone within reach;with bed alarm set Nurse Communication: Mobility status PT Visit Diagnosis: Unsteadiness on feet (R26.81);History of falling (Z91.81);Difficulty in walking, not elsewhere classified (R26.2);Muscle weakness (generalized) (M62.81)     Time: 3086-5784 PT Time Calculation (min) (ACUTE ONLY): 12 min  Charges:  $Therapeutic Activity: 8-22 mins                     Jetta Lout PTA 09/22/19, 3:31 PM

## 2019-09-22 NOTE — Progress Notes (Signed)
OT Cancellation Note  Patient Details Name: Jenna Robinson MRN: [id number] DOB: 02-06-1925   Cancelled Treatment:    Reason Eval/Treat Not Completed: Fatigue/lethargy limiting ability to participate. Upon arrival pt sleeping soundly. Per nursing, pt slept poorly and requesting OT to hold this AM. Will re-attempt at later date/time as able.   Kathie Dike, M.S. OTR/L  09/22/19, 9:47 AM  ascom [phone]

## 2019-09-22 NOTE — Consult Note (Signed)
Pharmacy Antibiotic Note  Jenna Robinson is a 86 y.o. female admitted on 09/19/2019 with sepsis due to UTI.  Pharmacy has been consulted for vancomycin dosing. She was originally started on ceftriaxone but has since grown out Enterococcus in her urine. She has a noted allergy to penicillin with a reaction of rash and I was unable to determine the severity due to her dementia and lack of information available regarding the reaction. Since admission her renal function has been stable  Plan: continue vancomycin 500 mg IV Q 24 hrs  Ke: 0.035 h-1, T1/2: 19.8 h  Css (calculated): 24.0/10.5 mcg/mL  Daily SCr while on vancomycin to assess renal function  vancomycin levels as clinically needed   Height: 5\' 9"  (175.3 cm) Weight: 51 kg (112 lb 7 oz) IBW/kg (Calculated) : 66.2  Temp (24hrs), Avg:97.8 F (36.6 C), Min:97.3 F (36.3 C), Max:98.4 F (36.9 C)  Recent Labs  Lab 09/19/19 2326 09/20/19 0042 09/20/19 0518 09/20/19 0521 09/21/19 0635 09/21/19 1012 09/22/19 0408  WBC 17.1*  --   --  12.0* 9.3  --  9.6  CREATININE 0.74  --   --  0.73 0.59  --  0.58  LATICACIDVEN  --  2.7* 2.3*  --   --  2.4*  --     Estimated Creatinine Clearance: 36.9 mL/min (by C-G formula based on SCr of 0.58 mg/dL).    Allergies  Allergen Reactions  . Penicillins Itching    Did it involve swelling of the face/tongue/throat, SOB, or low BP? No Did it involve sudden or severe rash/hives, skin peeling, or any reaction on the inside of your mouth or nose? Yes Did you need to seek medical attention at a hospital or doctor's office? No When did it last happen? If all above answers are "NO", may proceed with cephalosporin use.    Antimicrobials this admission: 9/8 ceftriaxone >> 9/9 vancomycin 9/9 >>   Microbiology results: 9/8 BCx: NG x 2 days 9/7 UCx: E faecalis (pan-sensitive)  9/8 SARS CoV-2: negative  9/8 MRSA PCR: negative  Thank you for allowing pharmacy to be a part of this  patient's care.  11/8 09/22/2019 6:58 AM

## 2019-09-22 NOTE — Progress Notes (Signed)
PROGRESS NOTE    Jenna Robinson  ZOX:[id number] DOB: Jun 21, 1925 DOA: 09/19/2019 PCP: Elba Barman, MD   Brief Narrative:  The patient is a elderly 86 year old Caucasian female with a past medical history significant for but not limited to diabetes mellitus type 2, hypertension, Alzheimer's dementia as well as other comorbidities including hypercholesterolemia, hypertension, hypothyroidism who was found on the ground and had a unwitnessed fall at her facility.  She did not complain of any headache, neck pain or back pain in her extremities but she did report to have some abdominal pain when questioned examined.  She does have a history of UTIs in the past and has had a fall with remote fracture last month with a small apical pneumothorax on chest x-ray.  In the ED she is found to have elevated elevated lactic acid level, elevated WBC of 17,000 and UA consistent with UTI.  She is empirically started on IV ceftriaxone and her small apical pneumothorax on chest x-ray was unchanged.  The dietitian was consulted and have severe malnutrition in the context of chronic illness.  PT OT will also be evaluating  **Interim History Patient does have UTI and grew out pansensitive Enterococcus faecalis so antibiotics were switched to IV vancomycin even her penicillin allergy. WBC is improved and went from 12.0 is now 9.6.   Assessment & Plan:   Principal Problem:   UTI (urinary tract infection) Active Problems:   Dementia without behavioral disturbance (HCC)   Diabetes mellitus without complication (HCC)   Protein-calorie malnutrition, severe   Pneumothorax, closed, traumatic   DNR (do not resuscitate)   Fall  Acute Enterococcus faecalis UTI ([place]); Sepsis ruled out -[name] was admitted to medical surgical floor with what originally was thought to be sepsis secondary to urinary tract infection.  -On admission She has elevated white blood cell count of 17,100 and lactic acid level but was  afebrile, had No tachycardia, or tachypenia; LA elevation could have been in the setting of ? Dehydration; She only met 1 of the SIRS Criteria -urinalysis showed hazy appearance with yellow color urine, greater than 500 glucose, large hemoglobin, trace leukocytes, negative nitrites, rare bacteria, greater than 50 RBCs per high-power field, and 6-10 WBCs with urine culture showing pansensitive Enterococcus faecalis to nitrofurantoin, ampicillin, and vancomycin; Was switched to IV Vanc and will continue; Will discuss about giving Fosfomycin to the patient after tomorrow's Vanc Dose -PCT was <0.10 -We will recheck lactic acid level i and it is slightly bumped to 2.4 so shwe was given another 500 mL bolus yesterday and started on IV fluid hydration again with normal saline at 100 MLS per hour but will stop today; Repeat LA was 1.7 -> 1.9 -Patient started on Rocephin for antibiotic coverage. Blood and urine cultures ordered the emergency room and will be monitored. -IV fluid hydration provided. -WBC is improving and went from 17.1 -> 12.0 -> 9.3 -> 9.6 -Continue to Monitor Temperature Curve and WBC and follow up on Cx's   Acute Fall -In the setting of UTI -CT Head showed "Atrophy, chronic microvascular disease. No acute intracranial abnormality" -CT Cervical Spine showed "Degenerative changes throughout the cervical spine. Reversal of the usual cervical lordosis with slight anterior subluxation of C3 on C4. No acute displaced fractures identified. Small right apical pneumothorax is again demonstrated, possibly increased since previous study." -PT OT recommending SNF -Check a right shoulder x-ray given that she is complaining of right shoulder pain; shoulder x-ray showed degenerative changes with changes consistent with  chronic rotator cuff injury and there is multiple rib fractures noted on the right side; chest x-ray this morning showed stable right rib fractures with small right apical pneumothorax    Diabetes mellitus without complication (HCC) -Monitor blood sugars before every meal and nightly.  -Sensitive Novolog SSI AC/HS provided for glycemic control.  -Hemoglobin A1c was done last month so would not be repeated at this time. Was less than 7 -CBGs ranging from 120-155  Protein-calorie malnutrition, severe -Chronic. Encourage p.o. intake.  -Dietary consult in the morning -Continue with Ensure Enlive p.o. twice daily as well as Magic cup 3 times daily with meals and a dysphagia 3 diet  Dementia without behavioral disturbance (HCC) -Chronic and stable  Pneumothorax, closed, traumatic -Stable from previous chest x-ray from a fall last month. -CTA shows Possibly increased size since last study -C/w Serial CXR and Monitoring of Respiratory Status; repeat chest x-ray yesterday morning showed stability  HTN -C/w Atenolol 25 mg po Daily   Hyponatremia -Mild -Na+ went from 134 -> 133 and is 133 again -Currently getting LR; May change to NS if continues to [place] -We will replete with IV sodium phosphate 10 mmol -Repeat CMP in the AM  Normocytic Anemia -Hgb/Hct went from 11.1/33.0 -> 9.4/28.3 today is 9.0/26.8 -Check Anemia Panel in the AM -Continue to Monitor for S/Sx of Bleeding; No overt bleeding currently noted -Repeat CBC in the AM   Hypomagnesemia -Patient magnesium levels 1.7 -Replete with IV mag sulfate 2 g  -Continue to monitor and replete as necessary -Repeat magnesium level in a.m.  Hypophosphatemia -Patient's phosphorus level this morning was 2.5 -Replete with IV sodium phosphate 10 mmol -Continue monitor replete as necessary -Repeat phosphorus level in a.m.  Abnormal LFTs -Mild with an AST of 56 and ALT of 67; patients ALT is now 71 and ALT is now 78 -Resume IV fluid hydration -Check right upper quadrant abdominal ultrasound and this is currently pending -Continue monitor and trend hepatic function panel repeat CMP in a.m. as well as an acute  hepatitis panel  GOC: DNR, poA  DVT prophylaxis: Enoxaparin 40 mg subcu every 24 Code Status: DO NOT RESUSCITATE Family Communication: No family present at bedside  Disposition Plan: Pending further clinical improvement and anticipating discharging to SNF within the next 24 hours  Status is: Inpatient  Remains inpatient appropriate because:Ongoing diagnostic testing needed not appropriate for outpatient work up, IV treatments appropriate due to intensity of illness or inability to take PO and Inpatient level of care appropriate due to severity of illness   Dispo: The patient is from: SNF              Anticipated d/c is to: SNF              Anticipated d/c date is: 1 day              Patient currently is not medically stable to d/c.  Consultants:   None   Procedures:  Right upper quadrant ultrasound  Antimicrobials:  Anti-infectives (From admission, onward)   Start     Dose/Rate Route Frequency Ordered Stop   09/22/19 1200  vancomycin (VANCOREADY) IVPB 500 mg/100 mL        500 mg 100 mL/hr over 60 Minutes Intravenous Every 24 hours 09/21/19 1411     09/21/19 1600  vancomycin (VANCOCIN) IVPB 1000 mg/200 mL premix        1,000 mg 200 mL/hr over 60 Minutes Intravenous  Once 09/21/19 1411 09/21/19 1918  09/20/19 0530  cefTRIAXone (ROCEPHIN) 1 g in sodium chloride 0.9 % 100 mL IVPB  Status:  Discontinued        1 g 200 mL/hr over 30 Minutes Intravenous Every 24 hours 09/20/19 0519 09/21/19 1411   09/20/19 0015  cefTRIAXone (ROCEPHIN) 1 g in sodium chloride 0.9 % 100 mL IVPB        1 g 200 mL/hr over 30 Minutes Intravenous  Once 09/20/19 0001 09/20/19 0110        Subjective: Seen and examined at bedside and she was again resting in the bed with no complaints.  Was little bit hungry.  No nausea or vomiting.  States that she does have a little bit of pain but felt okay.  No other concerns or complaints at this time.  Objective: Vitals:   09/21/19 1146 09/21/19 2010  09/22/19 0443 09/22/19 1141  BP: 121/62 (!) 143/78 137/69 (!) 115/58  Pulse: 68 83 74 64  Resp: 15 20 16    Temp: (!) 97.3 F (36.3 C) 98.4 F (36.9 C) 97.8 F (36.6 C) 97.9 F (36.6 C)  TempSrc: Oral Oral Oral   SpO2: 100% 97% 96% 96%  Weight:      Height:        Intake/Output Summary (Last 24 hours) at 09/22/2019 1459 Last data filed at 09/22/2019 0400 Gross per 24 hour  Intake 825.68 ml  Output --  Net 825.68 ml   Filed Weights   09/19/19 1920  Weight: 51 kg   Examination: Physical Exam:  Constitutional: The patient is a thin chronically ill-appearing Caucasian female currently no acute distress appears calm and is resting Eyes: Lids and conjunctivae normal, sclerae anicteric  ENMT: External Ears, Nose appear normal. Grossly normal hearing.  Has a large bruise on her forehead covering her right side of her forehead and into her nasal bridge.  Neck: Appears normal, supple, no cervical masses, normal ROM, no appreciable thyromegaly; no JVD Respiratory: Diminished to auscultation bilaterally, no wheezing, rales, rhonchi or crackles. Normal respiratory effort and patient is not tachypenic. No accessory muscle use.  Unlabored breathing Cardiovascular: RRR, no murmurs / rubs / gallops. S1 and S2 auscultated. No extremity edema.  Abdomen: Soft, non-tender, non-distended.  Bowel sounds positive.  GU: Deferred. Musculoskeletal: No clubbing / cyanosis of digits/nails. No joint deformity upper and lower extremities.  Skin: Has significant bruising on her forehead and some bruising on the right arm and her left leg. No induration; Warm and dry.  Neurologic: CN 2-12 grossly intact with no focal deficits.  Romberg sign and cerebellar reflexes not assessed.  Psychiatric: Slightly impaired judgment and insight. Alert and oriented x 1. Normal mood and appropriate affect.   Data Reviewed: I have personally reviewed following labs and imaging studies  CBC: Recent Labs  Lab 09/19/19 2326  09/20/19 0521 09/21/19 0635 09/22/19 0408  WBC 17.1* 12.0* 9.3 9.6  NEUTROABS 14.2*  --  6.6 6.7  HGB 11.1* 9.4* 10.0* 9.0*  HCT 33.0* 28.3* 29.7* 26.8*  MCV 91.4 92.2 91.7 91.2  PLT 341 231 282 672   Basic Metabolic Panel: Recent Labs  Lab 09/19/19 2326 09/20/19 0521 09/21/19 0635 09/22/19 0408  NA 134* 133* 133* 134*  K 4.2 4.1 3.8 3.9  CL 99 98 97* 101  CO2 28 24 27 26   GLUCOSE 216* 221* 160* 170*  BUN 21 20 16 16   CREATININE 0.74 0.73 0.59 0.58  CALCIUM 10.1 9.5 9.3 8.7*  MG  --   --  1.5*  1.7  PHOS  --   --  2.3* 2.5   GFR: Estimated Creatinine Clearance: 36.9 mL/min (by C-G formula based on SCr of 0.58 mg/dL). Liver Function Tests: Recent Labs  Lab 09/21/19 0635 09/22/19 0408  AST 56* 71*  ALT 67* 78*  ALKPHOS 156* 166*  BILITOT 0.8 0.7  PROT 5.8* 5.3*  ALBUMIN 3.0* 2.7*   No results for input(s): LIPASE, AMYLASE in the last 168 hours. No results for input(s): AMMONIA in the last 168 hours. Coagulation Profile: Recent Labs  Lab 09/20/19 0521  INR 1.1   Cardiac Enzymes: No results for input(s): CKTOTAL, CKMB, CKMBINDEX, TROPONINI in the last 168 hours. BNP (last 3 results) No results for input(s): PROBNP in the last 8760 hours. HbA1C: No results for input(s): HGBA1C in the last 72 hours. CBG: Recent Labs  Lab 09/21/19 1157 09/21/19 1550 09/21/19 1644 09/22/19 0746 09/22/19 1138  GLUCAP 250* 189* 193* 120* 155*   Lipid Profile: No results for input(s): CHOL, HDL, LDLCALC, TRIG, CHOLHDL, LDLDIRECT in the last 72 hours. Thyroid Function Tests: No results for input(s): TSH, T4TOTAL, FREET4, T3FREE, THYROIDAB in the last 72 hours. Anemia Panel: No results for input(s): VITAMINB12, FOLATE, FERRITIN, TIBC, IRON, RETICCTPCT in the last 72 hours. Sepsis Labs: Recent Labs  Lab 09/20/19 0518 09/20/19 0521 09/21/19 1012 09/22/19 0914 09/22/19 1223  PROCALCITON  --  <0.10  --   --   --   LATICACIDVEN 2.3*  --  2.4* 1.7 1.9    Recent Results  (from the past 240 hour(s))  SARS Coronavirus 2 by RT PCR (hospital order, performed in [facility] hospital lab) Nasopharyngeal Nasopharyngeal Swab     Status: None   Collection Time: 09/12/19  4:47 PM   Specimen: Nasopharyngeal Swab  Result Value Ref Range Status   SARS Coronavirus 2 NEGATIVE NEGATIVE Final    Comment: (NOTE) SARS-CoV-2 target nucleic acids are NOT DETECTED.  The SARS-CoV-2 RNA is generally detectable in upper and lower respiratory specimens during the acute phase of infection. The lowest concentration of SARS-CoV-2 viral copies this assay can detect is 250 copies / mL. A negative result does not preclude SARS-CoV-2 infection and should not be used as the sole basis for treatment or other patient management decisions.  A negative result may occur with improper specimen collection / handling, submission of specimen other than nasopharyngeal swab, presence of viral mutation(s) within the areas targeted by this assay, and inadequate number of viral copies (<250 copies / mL). A negative result must be combined with clinical observations, patient history, and epidemiological information.  Fact Sheet for Patients:   StrictlyIdeas.no  Fact Sheet for Healthcare Providers: BankingDealers.co.za  This test is not yet approved or  cleared by the [place] FDA and has been authorized for detection and/or diagnosis of SARS-CoV-2 by FDA under an Emergency Use Authorization (EUA).  This EUA will remain in effect (meaning this test can be used) for the duration of the COVID-19 declaration under Section 564(b)(1) of the Act, 21 U.S.C. section 360bbb-3(b)(1), unless the authorization is terminated or revoked sooner.  Performed at [facility], [address], [place], [place] [address]   Urine culture     Status: Abnormal   Collection Time: 09/12/19  6:18 PM   Specimen: Urine, Random  Result Value Ref Range Status    Specimen Description   Final    URINE, RANDOM Performed at [facility], [address] [place],  [address]    Special Requests  Final    NONE Performed at [facility], [address] [place], [place] [address]    Culture MULTIPLE SPECIES PRESENT, SUGGEST RECOLLECTION (A)  Final   Report Status 09/13/2019 FINAL  Final  Urine Culture     Status: Abnormal   Collection Time: 09/19/19  7:47 PM   Specimen: Urine, Random  Result Value Ref Range Status   Specimen Description   Final    URINE, RANDOM Performed at [facility], [address] [place], [place] [address]    Special Requests   Final    NONE Performed at [facility], [address] [place], [place] [address]    Culture >=100,000 COLONIES/mL ENTEROCOCCUS FAECALIS (A)  Final   Report Status 09/21/2019 FINAL  Final   Organism ID, Bacteria ENTEROCOCCUS FAECALIS (A)  Final      Susceptibility   Enterococcus faecalis - MIC*    AMPICILLIN <=2 SENSITIVE Sensitive     NITROFURANTOIN <=16 SENSITIVE Sensitive     VANCOMYCIN 1 SENSITIVE Sensitive     * >=100,000 COLONIES/mL ENTEROCOCCUS FAECALIS  Blood culture (routine x 2)     Status: None (Preliminary result)   Collection Time: 09/20/19 12:42 AM   Specimen: BLOOD  Result Value Ref Range Status   Specimen Description BLOOD RIGHT ARM  Final   Special Requests   Final    BOTTLES DRAWN AEROBIC AND ANAEROBIC Blood Culture results may not be optimal due to an inadequate volume of blood received in culture bottles   Culture   Final    NO GROWTH 2 DAYS Performed at [facility], [address], [place], [place] [address]    Report Status PENDING  Incomplete  Blood culture (routine x 2)     Status: None (Preliminary result)   Collection Time: 09/20/19 12:42 AM   Specimen: BLOOD  Result Value Ref Range Status   Specimen Description BLOOD RIGHT ARM  Final   Special Requests   Final    BOTTLES DRAWN AEROBIC AND ANAEROBIC  Blood Culture adequate volume   Culture   Final    NO GROWTH 2 DAYS Performed at [facility], [address], [place], [place] [address]    Report Status PENDING  Incomplete  SARS Coronavirus 2 by RT PCR (hospital order, performed in [place] hospital lab) Nasopharyngeal Nasopharyngeal Swab     Status: None   Collection Time: 09/20/19  6:19 AM   Specimen: Nasopharyngeal Swab  Result Value Ref Range Status   SARS Coronavirus 2 NEGATIVE NEGATIVE Final    Comment: (NOTE) SARS-CoV-2 target nucleic acids are NOT DETECTED.  The SARS-CoV-2 RNA is generally detectable in upper and lower respiratory specimens during the acute phase of infection. The lowest concentration of SARS-CoV-2 viral copies this assay can detect is 250 copies / mL. A negative result does not preclude SARS-CoV-2 infection and should not be used as the sole basis for treatment or other patient management decisions.  A negative result may occur with improper specimen collection / handling, submission of specimen other than nasopharyngeal swab, presence of viral mutation(s) within the areas targeted by this assay, and inadequate number of viral copies (<250 copies / mL). A negative result must be combined with clinical observations, patient history, and epidemiological information.  Fact Sheet for Patients:   StrictlyIdeas.no  Fact Sheet for Healthcare Providers: BankingDealers.co.za  This test is not yet approved or  cleared by the [place] FDA and has been authorized for detection and/or diagnosis of  SARS-CoV-2 by FDA under an Emergency Use Authorization (EUA).  This EUA will remain in effect (meaning this test can be used) for the duration of the COVID-19 declaration under Section 564(b)(1) of the Act, 21 U.S.C. section 360bbb-3(b)(1), unless the authorization is terminated or revoked sooner.  Performed at [facility], [address] [place], [place] [address]   MRSA PCR Screening     Status: None   Collection Time: 09/20/19  2:51 PM   Specimen: Nasopharyngeal  Result Value Ref Range Status   MRSA by PCR NEGATIVE NEGATIVE Final    Comment:        The GeneXpert MRSA Assay (FDA approved for NASAL specimens only), is one component of a comprehensive MRSA colonization surveillance program. It is not intended to diagnose MRSA infection nor to guide or monitor treatment for MRSA infections. Performed at [facility], [address] [place], [place] [address]      RN Pressure Injury Documentation:     Estimated body mass index is 16.6 kg/m as calculated from the following:   Height as of this encounter: 5' 9"  (1.753 m).   Weight as of this encounter: 51 kg.  Malnutrition Type:  Nutrition Problem: Severe Malnutrition Etiology: chronic illness (dementia)   Malnutrition Characteristics:  Signs/Symptoms: severe fat depletion, severe muscle depletion   Nutrition Interventions:  Interventions: Ensure Enlive (each supplement provides 350kcal and 20 grams of protein), Liberalize Diet     Radiology Studies: DG Chest 1 View  Result Date: 09/21/2019 CLINICAL DATA:  Follow-up pneumothorax EXAM: CHEST  1 VIEW COMPARISON:  09/19/2019 FINDINGS: Cardiac shadow is stable. Aortic calcifications are again seen. Multiple right-sided rib fractures are again identified and stable. Minimal right apical pneumothorax is noted stable from the prior exam given some change in the patient positioning. Small right-sided pleural effusion is noted. No focal infiltrate is seen. No other focal abnormality is noted. IMPRESSION: Stable right rib fractures with small right apical pneumothorax. Electronically Signed   By: Inez Catalina M.D.   On: 09/21/2019 09:38   DG Shoulder Right  Result Date: 09/21/2019 CLINICAL DATA:  Recent fall with right shoulder pain, initial encounter EXAM: RIGHT SHOULDER - 2+ VIEW COMPARISON:  None.  FINDINGS: Multiple right-sided rib fractures are again identified. The apical pneumothorax is not as well appreciated on this exam. There are degenerative changes of the glenohumeral joint as well as high-riding humeral head consistent with underlying chronic rotator cuff injury. No other focal abnormality is noted. IMPRESSION: Degenerative changes with changes consistent with chronic rotator cuff injury. Multiple right rib fractures. Electronically Signed   By: Inez Catalina M.D.   On: 09/21/2019 09:39   US Abdomen Limited RUQ  Result Date: 09/21/2019 CLINICAL DATA:  Abnormal LFTs EXAM: ULTRASOUND ABDOMEN LIMITED RIGHT UPPER QUADRANT COMPARISON:  None. FINDINGS: Gallbladder: No gallstones or wall thickening visualized. No sonographic Murphy sign noted by sonographer. Common bile duct: Diameter: 5 mm Liver: The liver parenchyma is coarsened and heterogeneous without evidence for discrete hepatic mass. Portal vein is patent on color Doppler imaging with normal direction of blood flow towards the liver. Other: Incidentally noted is a right-sided pleural effusion. IMPRESSION: 1. No evidence for cholelithiasis or acute cholecystitis. 2. Coarsened and heterogeneous appearance of the liver which may be secondary to underlying hepatocellular disease. 3. Incidentally noted right-sided pleural effusion. Electronically Signed   By: Constance Holster M.D.   On: 09/21/2019 18:56   Scheduled Meds: . aspirin EC  81 mg Oral Daily  .  atenolol  25 mg Oral Daily  . enoxaparin (LOVENOX) injection  40 mg Subcutaneous Q24H  . feeding supplement (ENSURE ENLIVE)  237 mL Oral BID BM  . insulin aspart  0-5 Units Subcutaneous QHS  . insulin aspart  0-9 Units Subcutaneous TID WC  . mouth rinse  15 mL Mouth Rinse BID  . oxybutynin  5 mg Oral BID  . polyethylene glycol  17 g Oral BID  . senna-docusate  1 tablet Oral BID   Continuous Infusions: . sodium chloride 100 mL/hr at 09/22/19 0400  . lactated ringers Stopped (09/21/19  1134)  . vancomycin 500 mg (09/22/19 1150)    LOS: 2 days   Kerney Elbe, DO [facility] PAGER is on [place]  If 7PM-7AM, please contact night-coverage [email or web address]

## 2019-09-22 NOTE — Care Management Important Message (Signed)
Important Message  Patient Details  Name: Jenna Robinson MRN: [id number] Date of Birth: Jun 14, 1925   Medicare Important Message Given:  Yes     [name] 09/22/2019, 1:39 PM

## 2019-09-22 NOTE — Progress Notes (Signed)
Mobility Specialist - Progress Note   09/22/19 1141  Mobility  Activity Transferred to/from [facility]  Level of Assistance Moderate assist, patient does 50-74%  Assistive Device BSC  Distance Ambulated (ft) 0 ft  Mobility Response Tolerated well  Mobility performed by Mobility specialist  $Mobility charge 1 Mobility    Pre-mobility: 63 HR, 124/66 BP, 96% SpO2 Post-mobility: 66 HR, 132/63 BP, 98% SpO2   Pt was sleeping in bed upon arrival. Pt was easily awakened and agreed to session. Pt initially denied any pain, dizziness, or fatigue. Pt appeared confused and was AOx1 to identity. Pt requested to get to [facility], but then stated that she "just wants to lay down". Pt performed straight leg raises on L LE with minA before requesting to use the [facility] again. This attempt, pt was successful in getting to [facility]. Pt was modA in both sit-to-stand and while pivoting towards BSC. Pt had an incontinent episode while standing from EOB. Pt had a small BM once getting to [facility]. Mobility tech assisted in hygiene. Upon getting back to bed, pt c/o chest pain. Session was concluded and RN was immediately notified. Overall, pt tolerated session well.   Filiberto Pinks Mobility Specialist 09/22/19, 11:51 AM

## 2019-09-23 LAB — COMPREHENSIVE METABOLIC PANEL
ALT: 78 U/L — ABNORMAL HIGH (ref 0–44)
AST: 69 U/L — ABNORMAL HIGH (ref 15–41)
Albumin: 2.9 g/dL — ABNORMAL LOW (ref 3.5–5.0)
Alkaline Phosphatase: 218 U/L — ABNORMAL HIGH (ref 38–126)
Anion gap: 8 (ref 5–15)
BUN: 16 mg/dL (ref 8–23)
CO2: 24 mmol/L (ref 22–32)
Calcium: 9.4 mg/dL (ref 8.9–10.3)
Chloride: 101 mmol/L (ref 98–111)
Creatinine, Ser: 0.57 mg/dL (ref 0.44–1.00)
GFR calc Af Amer: >60 mL/min (ref >60)
GFR calc non Af Amer: >60 mL/min (ref >60)
Glucose, Bld: 155 mg/dL — ABNORMAL HIGH (ref 70–99)
Potassium: 4.4 mmol/L (ref 3.5–5.1)
Sodium: 133 mmol/L — ABNORMAL LOW (ref 135–145)
Total Bilirubin: 0.7 mg/dL (ref 0.3–1.2)
Total Protein: 5.8 g/dL — ABNORMAL LOW (ref 6.5–8.1)

## 2019-09-23 LAB — CBC WITH DIFFERENTIAL/PLATELET
Abs Immature Granulocytes: 0.06 10*3/uL (ref 0.00–0.07)
Basophils Absolute: 0.1 10*3/uL (ref 0.0–0.1)
Basophils Relative: 1 %
Eosinophils Absolute: 0.4 10*3/uL (ref 0.0–0.5)
Eosinophils Relative: 5 %
HCT: 29.3 % — ABNORMAL LOW (ref 36.0–46.0)
Hemoglobin: 9.8 g/dL — ABNORMAL LOW (ref 12.0–15.0)
Immature Granulocytes: 1 %
Lymphocytes Relative: 19 %
Lymphs Abs: 1.7 10*3/uL (ref 0.7–4.0)
MCH: 30.6 pg (ref 26.0–34.0)
MCHC: 33.4 g/dL (ref 30.0–36.0)
MCV: 91.6 fL (ref 80.0–100.0)
Monocytes Absolute: 0.8 10*3/uL (ref 0.1–1.0)
Monocytes Relative: 9 %
Neutro Abs: 5.9 10*3/uL (ref 1.7–7.7)
Neutrophils Relative %: 65 %
Platelets: 306 10*3/uL (ref 150–400)
RBC: 3.2 MIL/uL — ABNORMAL LOW (ref 3.87–5.11)
RDW: 16.7 % — ABNORMAL HIGH (ref 11.5–15.5)
WBC: 8.9 10*3/uL (ref 4.0–10.5)
nRBC: 0 % (ref 0.0–0.2)

## 2019-09-23 LAB — GLUCOSE, CAPILLARY
Glucose-Capillary: 152 mg/dL — ABNORMAL HIGH (ref 70–99)
Glucose-Capillary: 279 mg/dL — ABNORMAL HIGH (ref 70–99)
Glucose-Capillary: 214 mg/dL — ABNORMAL HIGH (ref 70–99)
Glucose-Capillary: 132 mg/dL — ABNORMAL HIGH (ref 70–99)
Glucose-Capillary: 179 mg/dL — ABNORMAL HIGH (ref 70–99)

## 2019-09-23 LAB — SARS CORONAVIRUS 2 BY RT PCR (HOSPITAL ORDER, PERFORMED IN ~~LOC~~ HOSPITAL LAB): SARS Coronavirus 2: NEGATIVE

## 2019-09-23 LAB — PHOSPHORUS: Phosphorus: 2.2 mg/dL — ABNORMAL LOW (ref 2.5–4.6)

## 2019-09-23 LAB — MAGNESIUM: Magnesium: 2 mg/dL (ref 1.7–2.4)

## 2019-09-23 MED ORDER — LORAZEPAM 2 MG/ML IJ SOLN
0.5000 mg | Freq: Four times a day (QID) | INTRAMUSCULAR | Status: DC | PRN
  Administered 2019-09-23: 0.5 mg via INTRAVENOUS
  Filled 2019-09-23: qty 1

## 2019-09-23 MED ORDER — SODIUM PHOSPHATES 45 MMOLE/15ML IV SOLN
20.0000 mmol | Freq: Once | INTRAVENOUS | Status: AC
  Administered 2019-09-23: 20 mmol via INTRAVENOUS
  Filled 2019-09-23: qty 6.67

## 2019-09-23 NOTE — Progress Notes (Signed)
Mobility Specialist - Progress Note   09/23/19 1154  Mobility  Activity Transferred to/from [facility];Ambulated in room  Level of Assistance Contact guard assist, steadying assist  Assistive Device Front wheel walker  Distance Ambulated (ft) 15 ft  Mobility Response Tolerated well  Mobility performed by Mobility specialist  $Mobility charge 1 Mobility    Pre-mobility: 68 HR, 124/71 BP, 98% SpO2 Post-mobility: 74 HR, 148/66 BP, 98% SpO2   Pt asleep in bed upon arrival. Pt easily awakens. Pt requested to use the bathroom. Pt mod. Independent getting to EOB. Pt CGA w/ transfer from EOB to [facility]. Noted pt is cognitively deficit at baseline. However, she is able to follow verbal cues and commands t/o session. Pt constantly saying "I need to pee" while on BSC, and that she's "hungry". NT entered room mid-session and assisted w/ hygiene after pt finished urinating. Pt agreed to ambulate from [facility] to recliner. Pt needed CGA to ambulate ~10' to recliner using a RW. Overall, pt tolerated session well. Pt left sitting on recliner w/ nurse present in room at the end of session.     Paticia Moster Mobility Specialist  09/23/19, 12:03 PM

## 2019-09-23 NOTE — Plan of Care (Signed)
Continuing with plan of care. 

## 2019-09-23 NOTE — Progress Notes (Signed)
PROGRESS NOTE    Jenna Robinson  VZD:[id number] DOB: 03/25/1925 DOA: 09/19/2019 PCP: Elba Barman, MD   Brief Narrative:  The patient is a elderly 86 year old Caucasian female with a past medical history significant for but not limited to diabetes mellitus type 2, hypertension, Alzheimer's dementia as well as other comorbidities including hypercholesterolemia, hypertension, hypothyroidism who was found on the ground and had a unwitnessed fall at her facility.  She did not complain of any headache, neck pain or back pain in her extremities but she did report to have some abdominal pain when questioned examined.  She does have a history of UTIs in the past and has had a fall with remote fracture last month with a small apical pneumothorax on chest x-ray.  In the ED she is found to have elevated elevated lactic acid level, elevated WBC of 17,000 and UA consistent with UTI.  She is empirically started on IV ceftriaxone and her small apical pneumothorax on chest x-ray was unchanged.  The dietitian was consulted and have severe malnutrition in the context of chronic illness.  PT OT will also be evaluating  **Interim History Patient does have UTI and grew out pansensitive Enterococcus faecalis so antibiotics were switched to IV vancomycin even her penicillin allergy. WBC is improved and went from 12.0 is now 8.9   Assessment & Plan:   Principal Problem:   UTI (urinary tract infection) Active Problems:   Dementia without behavioral disturbance (HCC)   Diabetes mellitus without complication (HCC)   Protein-calorie malnutrition, severe   Pneumothorax, closed, traumatic   DNR (do not resuscitate)   Fall  Acute Enterococcus faecalis UTI ([place]); Sepsis ruled out -[name] was admitted to medical surgical floor with what originally was thought to be sepsis secondary to urinary tract infection.  -On admission She has elevated white blood cell count of 17,100 and lactic acid level but was  afebrile, had No tachycardia, or tachypenia; LA elevation could have been in the setting of ? Dehydration; She only met 1 of the SIRS Criteria -urinalysis showed hazy appearance with yellow color urine, greater than 500 glucose, large hemoglobin, trace leukocytes, negative nitrites, rare bacteria, greater than 50 RBCs per high-power field, and 6-10 WBCs with urine culture showing pansensitive Enterococcus faecalis to nitrofurantoin, ampicillin, and vancomycin; Was switched to IV Vanc and will continue; Will discuss about giving Fosfomycin prior to D/C (Has received 3 Days of IV Vanc) -PCT was <0.10 -IV fluid hydration is now stopped and lactic acid level has normalized -Patient started on Rocephin for antibiotic coverage. Blood and urine cultures ordered the emergency room and will be monitored. -IV fluid hydration provided. -WBC is improving and went from 17.1 -> 12.0 -> 9.3 -> 9.6 -> 8.9 -Continue to Monitor Temperature Curve and WBC and follow up on Cx's  -Will check COVID prior to D/C to SNF; She appears medically stable and has been improving   Acute Fall -In the setting of UTI -CT Head showed "Atrophy, chronic microvascular disease. No acute intracranial abnormality" -CT Cervical Spine showed "Degenerative changes throughout the cervical spine. Reversal of the usual cervical lordosis with slight anterior subluxation of C3 on C4. No acute displaced fractures identified. Small right apical pneumothorax is again demonstrated, possibly increased since previous study." -PT OT recommending SNF -Check a right shoulder x-ray given that she is complaining of right shoulder pain; shoulder x-ray showed degenerative changes with changes consistent with chronic rotator cuff injury and there is multiple rib fractures noted on the right  side; chest x-ray on the morning of 09/21/19 showed stable right rib fractures with small right apical pneumothorax   Diabetes mellitus without complication  (HCC) -Monitor blood sugars before every meal and nightly.  -Sensitive Novolog SSI AC/HS provided for glycemic control.  -Hemoglobin A1c was done last month so would not be repeated at this time. Was less than 7 -CBGs ranging from 114-279  Protein-calorie malnutrition, severe -Chronic. Encourage p.o. intake.  -Dietary consult in the morning -Continue with Ensure Enlive p.o. twice daily as well as Magic cup 3 times daily with meals and a dysphagia 3 diet  Dementia without behavioral disturbance (HCC) -Chronic and stable -She is A and Ox1  Pneumothorax, closed, traumatic -Stable from previous chest x-ray from a fall last month. -CTA shows Possibly increased size since last study -C/w Serial CXR and Monitoring of Respiratory Status; repeat chest x-ray yesterday morning showed stability  HTN -C/w Atenolol 25 mg po Daily   Hyponatremia -Mild -Na+ went from 134 -> 133 today  -IVF now stopped;  -We will replete with IV sodium phosphate 20 mmol today  -Repeat CMP in the AM  Normocytic Anemia -Hgb/Hct went from 11.1/33.0 -> and is now 9.8/29.3 -Check Anemia Panel in the AM -Continue to Monitor for S/Sx of Bleeding; No overt bleeding currently noted -Repeat CBC in the AM   Hypomagnesemia -Patient magnesium level is now 2.0 -Continue to monitor and replete as necessary -Repeat magnesium level in a.m.  Hypophosphatemia -Patient's phosphorus level this morning was 2.3 -Replete with IV sodium phosphate 20 mmol -Continue monitor replete as necessary -Repeat phosphorus level in a.m.  Abnormal LFTs; remain elevated but relatively stable -Mild. Remain elevated but relatively stable. -Has been getting Acetaminophen but may need to hold (1300 mg/day) -AST of 56 -> 71 -> 69 and ALT went from 67 -> 78 -> 78 -IVF now stopped  -Check right upper quadrant abdominal ultrasound and this showed "No evidence for cholelithiasis or acute cholecystitis. Coarsened and heterogeneous  appearance of the liver which may be secondary to underlying hepatocellular disease. Incidentally noted right-sided pleural effusion. -Continue monitor and trend hepatic function panel repeat CMP in a.m. as well as an acute hepatitis panel  GOC: DNR, poA  DVT prophylaxis: Enoxaparin 40 mg subcu every 24 Code Status: DO NOT RESUSCITATE Family Communication: No family present at bedside  Disposition Plan: Pending further clinical improvement and anticipating discharging to SNF when bed is available.   Status is: Inpatient  Remains inpatient appropriate because:Ongoing diagnostic testing needed not appropriate for outpatient work up, IV treatments appropriate due to intensity of illness or inability to take PO and Inpatient level of care appropriate due to severity of illness   Dispo: The patient is from: SNF              Anticipated d/c is to: SNF              Anticipated d/c date is: 1 day              Patient currently is medically stable to d/c.  Consultants:   None   Procedures:  Right upper quadrant ultrasound  Antimicrobials:  Anti-infectives (From admission, onward)   Start     Dose/Rate Route Frequency Ordered Stop   09/22/19 1200  vancomycin (VANCOREADY) IVPB 500 mg/100 mL        500 mg 100 mL/hr over 60 Minutes Intravenous Every 24 hours 09/21/19 1411     09/21/19 1600  vancomycin (VANCOCIN) IVPB 1000 mg/200 mL  premix        1,000 mg 200 mL/hr over 60 Minutes Intravenous  Once 09/21/19 1411 09/21/19 1918   09/20/19 0530  cefTRIAXone (ROCEPHIN) 1 g in sodium chloride 0.9 % 100 mL IVPB  Status:  Discontinued        1 g 200 mL/hr over 30 Minutes Intravenous Every 24 hours 09/20/19 0519 09/21/19 1411   09/20/19 0015  cefTRIAXone (ROCEPHIN) 1 g in sodium chloride 0.9 % 100 mL IVPB        1 g 200 mL/hr over 30 Minutes Intravenous  Once 09/20/19 0001 09/20/19 0110        Subjective: Seen and examined at bedside she was resting with no complaints.  Feels okay and was  hungry.  No nausea or vomiting.  Denied any other concerns or complaints at this time.  Objective: Vitals:   09/23/19 0438 09/23/19 0610 09/23/19 0749 09/23/19 1141  BP: (!) 135/96 (!) 155/61 (!) 169/91 (!) 148/66  Pulse: 60 (!) 58 62 74  Resp: _0 Temp: 98 F (36.7 C) (!) 97.5 F (36.4 C) 97.7 F (36.5 C) 97.9 F (36.6 C)  TempSrc: Oral Oral  Oral  SpO2: (!) 84% 97% 98% 98%  Weight:      Height:        Intake/Output Summary (Last 24 hours) at 09/23/2019 1404 Last data filed at 09/22/2019 1808 Gross per 24 hour  Intake 1049.48 ml  Output --  Net 1049.48 ml   Filed Weights   09/19/19 1920  Weight: 51 kg   Examination: Physical Exam:  Constitutional: The patient is a thin chronically ill-appearing Caucasian female currently in NAD and appears calm Eyes: Lids and conjunctivae normal, sclerae anicteric  ENMT: External Ears, Nose appear normal. Grossly normal hearing has a large bruise on her forehead covering the right side of her forehead and onto her nasal bridge Neck: Appears normal, supple, no cervical masses, normal ROM, no appreciable thyromegaly; no JVD Respiratory: Diminished to auscultation bilaterally, no wheezing, rales, rhonchi or crackles. Normal respiratory effort and patient is not tachypenic. No accessory muscle use.  Unlabored breathing Cardiovascular: RRR, no murmurs / rubs / gallops. S1 and S2 auscultated.  Abdomen: Soft, non-tender, non-distended. Bowel sounds positive.  GU: Deferred. Musculoskeletal: No clubbing / cyanosis of digits/nails. No joint deformity upper and lower extremities.  Skin: Significant bruising on her forehead and some bruising on the Right Arm and Left Leg. No induration; Warm and dry.  Neurologic: CN 2-12 grossly intact with no focal deficits. Romberg sign and cerebellar reflexes not assessed.  Psychiatric: Normal judgment and insight. Alert and oriented x 1. Normal mood and appropriate affect.   Data Reviewed: I have  personally reviewed following labs and imaging studies  CBC: Recent Labs  Lab 09/19/19 2326 09/20/19 0521 09/21/19 0635 09/22/19 0408 09/23/19 0452  WBC 17.1* 12.0* 9.3 9.6 8.9  NEUTROABS 14.2*  --  6.6 6.7 5.9  HGB 11.1* 9.4* 10.0* 9.0* 9.8*  HCT 33.0* 28.3* 29.7* 26.8* 29.3*  MCV 91.4 92.2 91.7 91.2 91.6  PLT 341 231 282 264 631   Basic Metabolic Panel: Recent Labs  Lab 09/19/19 2326 09/20/19 0521 09/21/19 0635 09/22/19 0408 09/23/19 0452  NA 134* 133* 133* 134* 133*  K 4.2 4.1 3.8 3.9 4.4  CL 99 98 97* 101 101  CO2 _1 GLUCOSE 216* 221* 160* 170* 155*  BUN _2 CREATININE 0.74 0.73 0.59 0.58 0.57  CALCIUM 10.1 9.5 9.3 8.7* 9.4  MG  --   --  1.5* 1.7 2.0  PHOS  --   --  2.3* 2.5 2.2*   GFR: Estimated Creatinine Clearance: 36.9 mL/min (by C-G formula based on SCr of 0.57 mg/dL). Liver Function Tests: Recent Labs  Lab 09/21/19 0635 09/22/19 0408 09/23/19 0452  AST 56* 71* 69*  ALT 67* 78* 78*  ALKPHOS 156* 166* 218*  BILITOT 0.8 0.7 0.7  PROT 5.8* 5.3* 5.8*  ALBUMIN 3.0* 2.7* 2.9*   No results for input(s): LIPASE, AMYLASE in the last 168 hours. No results for input(s): AMMONIA in the last 168 hours. Coagulation Profile: Recent Labs  Lab 09/20/19 0521  INR 1.1   Cardiac Enzymes: No results for input(s): CKTOTAL, CKMB, CKMBINDEX, TROPONINI in the last 168 hours. BNP (last 3 results) No results for input(s): PROBNP in the last 8760 hours. HbA1C: No results for input(s): HGBA1C in the last 72 hours. CBG: Recent Labs  Lab 09/22/19 1138 09/22/19 1628 09/22/19 2123 09/23/19 0747 09/23/19 1134  GLUCAP 155* 303* 114* 152* 279*   Lipid Profile: No results for input(s): CHOL, HDL, LDLCALC, TRIG, CHOLHDL, LDLDIRECT in the last 72 hours. Thyroid Function Tests: No results for input(s): TSH, T4TOTAL, FREET4, T3FREE, THYROIDAB in the last 72 hours. Anemia Panel: No results for input(s): VITAMINB12, FOLATE, FERRITIN, TIBC, IRON,  RETICCTPCT in the last 72 hours. Sepsis Labs: Recent Labs  Lab 09/20/19 0518 09/20/19 0521 09/21/19 1012 09/22/19 0914 09/22/19 1223  PROCALCITON  --  <0.10  --   --   --   LATICACIDVEN 2.3*  --  2.4* 1.7 1.9    Recent Results (from the past 240 hour(s))  Urine Culture     Status: Abnormal   Collection Time: 09/19/19  7:47 PM   Specimen: Urine, Random  Result Value Ref Range Status   Specimen Description   Final    URINE, RANDOM Performed at [facility], [address] [place], [place] [address]    Special Requests   Final    NONE Performed at [facility], [address] [place], [place] [address]    Culture >=100,000 COLONIES/mL ENTEROCOCCUS FAECALIS (A)  Final   Report Status 09/21/2019 FINAL  Final   Organism ID, Bacteria ENTEROCOCCUS FAECALIS (A)  Final      Susceptibility   Enterococcus faecalis - MIC*    AMPICILLIN <=2 SENSITIVE Sensitive     NITROFURANTOIN <=16 SENSITIVE Sensitive     VANCOMYCIN 1 SENSITIVE Sensitive     * >=100,000 COLONIES/mL ENTEROCOCCUS FAECALIS  Blood culture (routine x 2)     Status: None (Preliminary result)   Collection Time: 09/20/19 12:42 AM   Specimen: BLOOD  Result Value Ref Range Status   Specimen Description BLOOD RIGHT ARM  Final   Special Requests   Final    BOTTLES DRAWN AEROBIC AND ANAEROBIC Blood Culture results may not be optimal due to an inadequate volume of blood received in culture bottles   Culture   Final    NO GROWTH 3 DAYS Performed at [facility], [address] [place], [place] [address]    Report Status PENDING  Incomplete  Blood culture (routine x 2)     Status: None (Preliminary result)   Collection Time: 09/20/19 12:42 AM   Specimen: BLOOD  Result Value Ref Range Status   Specimen Description BLOOD RIGHT ARM  Final   Special Requests   Final    BOTTLES DRAWN AEROBIC AND ANAEROBIC  Blood Culture adequate volume   Culture   Final    NO GROWTH 3 DAYS Performed at [facility], [address] [place], [place] [address]    Report Status PENDING  Incomplete  SARS Coronavirus 2 by RT PCR (hospital order, performed in [facility] hospital lab) Nasopharyngeal Nasopharyngeal Swab     Status: None   Collection Time: 09/20/19  6:19 AM   Specimen: Nasopharyngeal Swab  Result Value Ref Range Status   SARS Coronavirus 2 NEGATIVE NEGATIVE Final    Comment: (NOTE) SARS-CoV-2 target nucleic acids are NOT DETECTED.  The SARS-CoV-2 RNA is generally detectable in upper and lower respiratory specimens during the acute phase of infection. The lowest concentration of SARS-CoV-2 viral copies this assay can detect is 250 copies / mL. A negative result does not preclude SARS-CoV-2 infection and should not be used as the sole basis for treatment or other patient management decisions.  A negative result may occur with improper specimen collection / handling, submission of specimen other than nasopharyngeal swab, presence of viral mutation(s) within the areas targeted by this assay, and inadequate number of viral copies (<250 copies / mL). A negative result must be combined with clinical observations, patient history, and epidemiological information.  Fact Sheet for Patients:   StrictlyIdeas.no  Fact Sheet for Healthcare Providers: BankingDealers.co.za  This test is not yet approved or  cleared by the [place] FDA and has been authorized for detection and/or diagnosis of SARS-CoV-2 by FDA under an Emergency Use Authorization (EUA).  This EUA will remain in effect (meaning this test can be used) for the duration of the COVID-19 declaration under Section 564(b)(1) of the Act, 21 U.S.C. section 360bbb-3(b)(1), unless the authorization is terminated or revoked sooner.  Performed at [facility], [address] [place], [place] [address]   MRSA PCR Screening     Status: None   Collection Time: 09/20/19   2:51 PM   Specimen: Nasopharyngeal  Result Value Ref Range Status   MRSA by PCR NEGATIVE NEGATIVE Final    Comment:        The GeneXpert MRSA Assay (FDA approved for NASAL specimens only), is one component of a comprehensive MRSA colonization surveillance program. It is not intended to diagnose MRSA infection nor to guide or monitor treatment for MRSA infections. Performed at [facility], [address] [place], [place] [address]      RN Pressure Injury Documentation:     Estimated body mass index is 16.6 kg/m as calculated from the following:   Height as of this encounter: _0  (1.753 m).   Weight as of this encounter: 51 kg.  Malnutrition Type:  Nutrition Problem: Severe Malnutrition Etiology: chronic illness (dementia)   Malnutrition Characteristics:  Signs/Symptoms: severe fat depletion, severe muscle depletion   Nutrition Interventions:  Interventions: Ensure Enlive (each supplement provides 350kcal and 20 grams of protein), Liberalize Diet   Radiology Studies: US Abdomen Limited RUQ  Result Date: 09/21/2019 CLINICAL DATA:  Abnormal LFTs EXAM: ULTRASOUND ABDOMEN LIMITED RIGHT UPPER QUADRANT COMPARISON:  None. FINDINGS: Gallbladder: No gallstones or wall thickening visualized. No sonographic Murphy sign noted by sonographer. Common bile duct: Diameter: 5 mm Liver: The liver parenchyma is coarsened and heterogeneous without evidence for discrete hepatic mass. Portal vein is patent on color Doppler imaging with normal direction of blood flow towards the liver. Other: Incidentally noted is a right-sided pleural effusion. IMPRESSION: 1. No evidence for cholelithiasis or acute cholecystitis. 2. Coarsened and heterogeneous appearance  of the liver which may be secondary to underlying hepatocellular disease. 3. Incidentally noted right-sided pleural effusion. Electronically Signed   By: Constance Holster M.D.   On: 09/21/2019 18:56   Scheduled Meds: . aspirin  EC  81 mg Oral Daily  . atenolol  25 mg Oral Daily  . enoxaparin (LOVENOX) injection  40 mg Subcutaneous Q24H  . feeding supplement (ENSURE ENLIVE)  237 mL Oral BID BM  . insulin aspart  0-5 Units Subcutaneous QHS  . insulin aspart  0-9 Units Subcutaneous TID WC  . mouth rinse  15 mL Mouth Rinse BID  . oxybutynin  5 mg Oral BID  . polyethylene glycol  17 g Oral BID  . senna-docusate  1 tablet Oral BID   Continuous Infusions: . sodium phosphate  Dextrose 5% IVPB 20 mmol (09/23/19 1142)  . vancomycin 500 mg (09/23/19 1143)    LOS: 3 days   Kerney Elbe, DO [facility] PAGER is on [place]  If 7PM-7AM, please contact night-coverage [email or web address]

## 2019-09-24 LAB — CBC WITH DIFFERENTIAL/PLATELET
Abs Immature Granulocytes: 0.1 10*3/uL — ABNORMAL HIGH (ref 0.00–0.07)
Abs Immature Granulocytes: 0.07 10*3/uL (ref 0.00–0.07)
Basophils Absolute: 0.1 10*3/uL (ref 0.0–0.1)
Basophils Absolute: 0.1 10*3/uL (ref 0.0–0.1)
Basophils Relative: 1 %
Basophils Relative: 1 %
Eosinophils Absolute: 0.6 10*3/uL — ABNORMAL HIGH (ref 0.0–0.5)
Eosinophils Absolute: 0.5 10*3/uL (ref 0.0–0.5)
Eosinophils Relative: 5 %
Eosinophils Relative: 4 %
HCT: 30.5 % — ABNORMAL LOW (ref 36.0–46.0)
HCT: 29.1 % — ABNORMAL LOW (ref 36.0–46.0)
Hemoglobin: 10.2 g/dL — ABNORMAL LOW (ref 12.0–15.0)
Hemoglobin: 9.7 g/dL — ABNORMAL LOW (ref 12.0–15.0)
Immature Granulocytes: 1 %
Immature Granulocytes: 1 %
Lymphocytes Relative: 15 %
Lymphocytes Relative: 11 %
Lymphs Abs: 1.8 10*3/uL (ref 0.7–4.0)
Lymphs Abs: 1.2 10*3/uL (ref 0.7–4.0)
MCH: 30.7 pg (ref 26.0–34.0)
MCH: 30.7 pg (ref 26.0–34.0)
MCHC: 33.4 g/dL (ref 30.0–36.0)
MCHC: 33.3 g/dL (ref 30.0–36.0)
MCV: 91.9 fL (ref 80.0–100.0)
MCV: 92.1 fL (ref 80.0–100.0)
Monocytes Absolute: 1 10*3/uL (ref 0.1–1.0)
Monocytes Absolute: 0.8 10*3/uL (ref 0.1–1.0)
Monocytes Relative: 9 %
Monocytes Relative: 7 %
Neutro Abs: 8.2 10*3/uL — ABNORMAL HIGH (ref 1.7–7.7)
Neutro Abs: 8.9 10*3/uL — ABNORMAL HIGH (ref 1.7–7.7)
Neutrophils Relative %: 69 %
Neutrophils Relative %: 76 %
Platelets: 394 10*3/uL (ref 150–400)
Platelets: 366 10*3/uL (ref 150–400)
RBC: 3.32 MIL/uL — ABNORMAL LOW (ref 3.87–5.11)
RBC: 3.16 MIL/uL — ABNORMAL LOW (ref 3.87–5.11)
RDW: 16.9 % — ABNORMAL HIGH (ref 11.5–15.5)
RDW: 16.9 % — ABNORMAL HIGH (ref 11.5–15.5)
WBC: 11.7 10*3/uL — ABNORMAL HIGH (ref 4.0–10.5)
WBC: 11.6 10*3/uL — ABNORMAL HIGH (ref 4.0–10.5)
nRBC: 0 % (ref 0.0–0.2)
nRBC: 0 % (ref 0.0–0.2)

## 2019-09-24 LAB — COMPREHENSIVE METABOLIC PANEL
ALT: 76 U/L — ABNORMAL HIGH (ref 0–44)
AST: 61 U/L — ABNORMAL HIGH (ref 15–41)
Albumin: 3.2 g/dL — ABNORMAL LOW (ref 3.5–5.0)
Alkaline Phosphatase: 250 U/L — ABNORMAL HIGH (ref 38–126)
Anion gap: 8 (ref 5–15)
BUN: 16 mg/dL (ref 8–23)
CO2: 26 mmol/L (ref 22–32)
Calcium: 9.8 mg/dL (ref 8.9–10.3)
Chloride: 98 mmol/L (ref 98–111)
Creatinine, Ser: 0.62 mg/dL (ref 0.44–1.00)
GFR calc Af Amer: >60 mL/min (ref >60)
GFR calc non Af Amer: >60 mL/min (ref >60)
Glucose, Bld: 152 mg/dL — ABNORMAL HIGH (ref 70–99)
Potassium: 3.9 mmol/L (ref 3.5–5.1)
Sodium: 132 mmol/L — ABNORMAL LOW (ref 135–145)
Total Bilirubin: 0.8 mg/dL (ref 0.3–1.2)
Total Protein: 6.1 g/dL — ABNORMAL LOW (ref 6.5–8.1)

## 2019-09-24 LAB — GLUCOSE, CAPILLARY
Glucose-Capillary: 131 mg/dL — ABNORMAL HIGH (ref 70–99)
Glucose-Capillary: 242 mg/dL — ABNORMAL HIGH (ref 70–99)
Glucose-Capillary: 172 mg/dL — ABNORMAL HIGH (ref 70–99)

## 2019-09-24 LAB — HEPATITIS PANEL, ACUTE
HCV Ab: NONREACTIVE
Hep A IgM: NONREACTIVE
Hep B C IgM: NONREACTIVE
Hepatitis B Surface Ag: NONREACTIVE

## 2019-09-24 LAB — PHOSPHORUS: Phosphorus: 2.7 mg/dL (ref 2.5–4.6)

## 2019-09-24 LAB — MAGNESIUM: Magnesium: 1.8 mg/dL (ref 1.7–2.4)

## 2019-09-24 MED ORDER — SENNOSIDES-DOCUSATE SODIUM 8.6-50 MG PO TABS: 1.0000 | ORAL_TABLET | Freq: Every day | ORAL | Status: AC

## 2019-09-24 MED ORDER — MORPHINE SULFATE 10 MG/5ML PO SOLN
10.0000 mg | ORAL | 0 refills | Status: AC | PRN
Start: 2019-09-24 — End: ?

## 2019-09-24 MED ORDER — BISACODYL 10 MG RE SUPP: 10.0000 mg | Freq: Every day | RECTAL | 0 refills | Status: AC | PRN

## 2019-09-24 MED ORDER — MAGNESIUM SULFATE 2 GM/50ML IV SOLN
2.0000 g | Freq: Once | INTRAVENOUS | Status: AC
  Administered 2019-09-24: 2 g via INTRAVENOUS
  Filled 2019-09-24: qty 50

## 2019-09-24 MED ORDER — SODIUM PHOSPHATES 45 MMOLE/15ML IV SOLN
10.0000 mmol | Freq: Once | INTRAVENOUS | Status: AC
  Administered 2019-09-24: 10 mmol via INTRAVENOUS
  Filled 2019-09-24: qty 3.33

## 2019-09-24 MED ORDER — POLYETHYLENE GLYCOL 3350 17 G PO PACK: 17.0000 g | PACK | Freq: Two times a day (BID) | ORAL | 0 refills | Status: AC

## 2019-09-24 MED ORDER — ASPIRIN 81 MG PO TBEC: 81.0000 mg | DELAYED_RELEASE_TABLET | Freq: Every day | ORAL | 11 refills | Status: DC

## 2019-09-24 MED ORDER — FOSFOMYCIN TROMETHAMINE 3 G PO PACK
3.0000 g | PACK | Freq: Once | ORAL | Status: AC
  Administered 2019-09-24: 3 g via ORAL
  Filled 2019-09-24: qty 3

## 2019-09-24 NOTE — Progress Notes (Signed)
Mobility Specialist - Progress Note   09/24/19 1146  Mobility  Activity Ambulated to bathroom;Dangled on edge of bed  Range of Motion/Exercises Right leg;Left leg (Seated march, straight leg raises)  Level of Assistance Contact guard assist, steadying assist  Assistive Device Front wheel walker  Distance Ambulated (ft) 20 ft  Mobility Response Tolerated well  Mobility performed by Mobility specialist  $Mobility charge 1 Mobility    Pre-mobility: 74 HR, 118/69 BP, 96% SpO2 Post-mobility: 80 HR, 139/77 BP, 99% SpO2   Pt was sleeping in bed upon arrival. Pt was easily awakened and agreed to session. Pt was able to get to EOB with no physical assistance. Once EOB, pt performed exercises: seated march and straight leg raises (10x/leg) independently. Pt stated that she needed to urinate, and with RW pt was able to ambulate a total of 20' to/from bathroom. Pt was minA in sit-to-stand and CGA while ambulating with RW. While in the middle of  having a BM, pt kept asking if she could "go back to bed now". Mobility tech assisted with hygiene. Pt would constantly state "I'm hungry" throughout session. Overall, pt tolerated session well. Pt was left in bed with alarm set. Nurse was notified.    Filiberto Pinks Mobility Specialist 09/24/19, 11:54 AM

## 2019-09-24 NOTE — Progress Notes (Signed)
Report given to receiving nurse at [facility].  Will call EMS once patient's IV medication is completed.

## 2019-09-24 NOTE — Plan of Care (Signed)
Patient is in stable condition.  EMS called for transport to [facility].

## 2019-09-24 NOTE — Discharge Summary (Signed)
Physician Discharge Summary  Jenna Robinson ZOX:[id number] DOB: 12/11/1925 DOA: 09/19/2019  PCP: Elba Barman, MD  Admit date: 09/19/2019 Discharge date: 09/24/2019  Admitted From: ALF Disposition: SNF with Resumption of Hospice  Recommendations for Outpatient Follow-up:  1. Follow up with PCP in 1-2 weeks 2. Please obtain CMP/CBC, Mag, Phos in one week 3. Repeat chest x-ray in 1 to 2 weeks 4. Please follow up on the following pending results:  Home Health: No  Equipment/Devices: None  Discharge Condition: Stable  CODE STATUS: DO NOT RESUSCITATE  Diet recommendation: Dysphagia 3 Diet  Brief/Interim Summary: The patient is a elderly 86 year old Caucasian female with a past medical history significant for but not limited to diabetes mellitus type 2, hypertension, Alzheimer's dementia as well as other comorbidities including hypercholesterolemia, hypertension, hypothyroidism who was found on the ground and had a unwitnessed fall at her facility. She did not complain of any headache, neck pain or back pain in her extremities but she did report to have some abdominal pain when questioned examined. She does have a history of UTIs in the past and has had a fall with remote fracture last month with a small apical pneumothorax on chest x-ray. In the ED she is found to have elevated elevated lactic acid level, elevated WBC of 17,000 and UA consistent with UTI. She is empirically started on IV ceftriaxone and her small apical pneumothorax on chest x-ray was unchanged. The dietitian was consulted and have severe malnutrition in the context of chronic illness. PT OT will also be evaluating  **Interim History Patient does have UTI and grew out pansensitive Enterococcus faecalis so antibiotics were switched to IV vancomycin even her penicillin allergy. WBC is improved and went from 12.0 is now 8.9 but slightly elevated rate in the setting of reactivity versus pain.  She does not appear to  have any infection and continues to have rib fractures which are causing her pain.  Repeat this morning after was stable and not worsening further.  Currently she is stable to be discharged to skilled nursing facility and will need to follow-up with palliative care and hospice in the outpatient setting and continue rehab at [facility].  She will need to see PCP and repeat a chest x-ray in 2 weeks.  Discharge Diagnoses:  Principal Problem:   UTI (urinary tract infection) Active Problems:   Dementia without behavioral disturbance (HCC)   Diabetes mellitus without complication (HCC)   Protein-calorie malnutrition, severe   Pneumothorax, closed, traumatic   DNR (do not resuscitate)   Fall  Acute Enterococcus faecalisUTI ([place]); Sepsis ruled out -Ms. Cateswasadmitted to medical surgical floor withwhat originally was thought to besepsis secondary to urinary tract infection.  -On admissionShe has elevated white blood cell count of 17,100 and lactic acid levelbut was afebrile, had No tachycardia, or tachypenia; LA elevation could have been in the setting of ? Dehydration; She only met 1 of the SIRS Criteria -urinalysis showed hazy appearance with yellow color urine, greater than 500 glucose, large hemoglobin, trace leukocytes, negative nitrites, rare bacteria, greater than 50 RBCs per high-power field, and 6-10 WBCs with urine culture showing pansensitive Enterococcus faecalis to nitrofurantoin, ampicillin, and vancomycin; Was switched to IV Vanc and will continue; Will give Fosfomycin prior to D/C (Has received 4 Days of IV Vanc) -PCT was <0.10 -IV fluid hydration is now stopped and lactic acid level has normalized -Patient started on Rocephin for antibiotic coverage. Blood and urine cultures ordered the emergency room and will be monitored. -IV fluid  hydration provided. -WBC is improving and went from 17.1 ->12.0 -> 9.3 -> 9.6 -> 8.9 -> 11.7 and repeat today was 11.6; Elevation likely from Pain and  Reactive as she is afebrile  -Continue to Monitor Temperature Curve and WBC and follow up on Cx's  -Checked COVID prior to D/C to SNF and is Negative; She appears medically stable and has been improving   Acute Fall  Rib Fractures -In the setting of UTI -CT Head showed "Atrophy, chronic microvascular disease. No acute intracranial abnormality" -CT Cervical Spine showed "Degenerative changes throughout the cervical spine. Reversal of the usual cervical lordosis with slight anterior subluxation of C3 on C4. No acute displaced fractures identified. Small right apical pneumothorax is again demonstrated, possibly increased since previous study." -PT OT recommending SNF -Check a right shoulder x-ray given that she is complaining of right shoulder pain; shoulder x-ray showed degenerative changes with changes consistent with chronic rotator cuff injury and there is multiple rib fractures noted on the right side; chest x-ray on the morning of 09/21/19 showed stable right rib fractures with small right apical pneumothorax -C/w Pain control with Acetaminophen and Morphine -Incentive Spirometry  -Repeat CXR in 2 weeks   Diabetes mellitus without complication (HCC) -Monitor blood sugars before every meal and nightly. -Sensitive NovologSSIAC/HSprovided for glycemic control.  -HemoglobinA1cwas done last month so would not be repeated at this time. Was less than 7 -CBGs ranging from 179-242  Protein-calorie malnutrition, severe -Chronic. Encourage p.o. intake. -Dietary consult in the morning -Continue with Ensure Enlive p.o. twice daily as well as Magic cup 3 times daily with meals and a dysphagia 3 diet  Dementia without behavioral disturbance (HCC) -Chronic and stable -She is A and Ox1  Pneumothorax, closed, traumatic -Stable from previous chest x-ray from a fall last month. -CTA shows Possibly increased size since last study -C/w Serial CXR and Monitoring of Respiratory Status;  repeat chest x-ray 9/9 morning showed stability  HTN -C/w Atenolol 25 mg po Daily   Hyponatremia -Mild -Na+ went from 134 -> 133 -> 132 today  -IVF now stopped;  -We will replete with IV sodium phosphate 10 mmol today  -Repeat CMP at SNF  Normocytic Anemia -Hgb/Hct went from 11.1/33.0 -> 9.0/26.8 -> 9.8/29.3 -> 10.2/30.5 -> 9.7/29.1 -Check Anemia Panel in the AM -Continue to Monitor for S/Sx of Bleeding; No overt bleeding currently noted -Repeat CBC at SNF  Hypomagnesemia -Patient magnesium level is now 1.8 -Replete with IV Mag Sulfate 2 grams  -Continue to monitor and replete as necessary -Repeat magnesium at SNF  Hypophosphatemia -Patient's phosphorus level this morning was 2.7 -Replete with IV sodium phosphate 10 mmol -Continue monitor replete as necessary -Repeat phosphorus at SNF  Abnormal LFTs; remain elevated but relatively stable -Mild. Remain elevated but relatively stable. -Has been getting Acetaminophen but may need to hold (1300 mg/day) -AST of 56 -> 71 -> 69 -> 61 and ALT went from 67 -> 78 -> 78 -> 76 -IVF now stopped  -Check right upper quadrant abdominal ultrasound and this showed "No evidence for cholelithiasis or acute cholecystitis. Coarsened and heterogeneous appearance of the liver which may be secondary to underlying hepatocellular disease. Incidentally noted right-sided pleural effusion. -Acute Hepatitis Panel Negative -Continue monitor and trend hepatic function panel in the outpatient setting   GOC: DNR, poA -C/w Hospice and Palliative Care in the Outpatient setting  -Was discharged to ALF with Hospice last time  Discharge Instructions  Discharge Instructions    Call MD for:  difficulty breathing,  headache or visual disturbances   Complete by: As directed    Call MD for:  extreme fatigue   Complete by: As directed    Call MD for:  hives   Complete by: As directed    Call MD for:  persistant dizziness or light-headedness   Complete  by: As directed    Call MD for:  persistant nausea and vomiting   Complete by: As directed    Call MD for:  redness, tenderness, or signs of infection (pain, swelling, redness, odor or green/yellow discharge around incision site)   Complete by: As directed    Call MD for:  severe uncontrolled pain   Complete by: As directed    Call MD for:  temperature >100.4   Complete by: As directed    Diet - low sodium heart healthy   Complete by: As directed    Discharge instructions   Complete by: As directed    You were cared for by a hospitalist during your hospital stay. If you have any questions about your discharge medications or the care you received while you were in the hospital after you are discharged, you can call the unit and ask to speak with the hospitalist on call if the hospitalist that took care of you is not available. Once you are discharged, your primary care physician will handle any further medical issues. Please note that NO REFILLS for any discharge medications will be authorized once you are discharged, as it is imperative that you return to your primary care physician (or establish a relationship with a primary care physician if you do not have one) for your aftercare needs so that they can reassess your need for medications and monitor your lab values.  Follow up with PCP and repeat Labs within 1 week. Take all medications as prescribed. If symptoms change or worsen please return to the ED for evaluation   Discharge wound care:   Complete by: As directed    Per [facility]   Increase activity slowly   Complete by: As directed      Allergies as of 09/24/2019      Reactions   Penicillins Itching   Did it involve swelling of the face/tongue/throat, SOB, or low BP? No Did it involve sudden or severe rash/hives, skin peeling, or any reaction on the inside of your mouth or nose? Yes Did you need to seek medical attention at a hospital or doctor's office? No When did it  last happen? If all above answers are "NO", may proceed with cephalosporin use.      Medication List    TAKE these medications   acetaminophen 325 MG tablet Commonly known as: TYLENOL Take 650 mg by mouth every 6 (six) hours as needed for mild pain, moderate pain, fever or headache.   alendronate 70 MG tablet Commonly known as: FOSAMAX Take 1 tablet by mouth once a week. With 8 oz of water at least 30 minutes before food/meds. Do not lie down for 30 minutes after dose.   atenolol 25 MG tablet Commonly known as: TENORMIN Take 25 mg by mouth daily.   bisacodyl 10 MG suppository Commonly known as: DULCOLAX Place 1 suppository (10 mg total) rectally daily as needed for moderate constipation.   feeding supplement Liqd Take 1 Container by mouth 2 (two) times daily between meals.   morphine 10 MG/5ML solution Take 5 mLs (10 mg total) by mouth every 2 (two) hours as needed for moderate pain or severe  pain.   Mylanta 200-200-20 MG/5ML suspension Generic drug: alum & mag hydroxide-simeth Take 30 mLs by mouth every 6 (six) hours as needed for indigestion or heartburn.   oxybutynin 5 MG tablet Commonly known as: DITROPAN Take 5 mg by mouth 2 (two) times daily.   Pepto-Bismol 262 MG/15ML suspension Generic drug: bismuth subsalicylate Take 15 mLs by mouth every 6 (six) hours as needed.   polyethylene glycol 17 g packet Commonly known as: MIRALAX / GLYCOLAX Take 17 g by mouth 2 (two) times daily.   senna-docusate 8.6-50 MG tablet Commonly known as: Senokot-S Take 1 tablet by mouth at bedtime.            Discharge Care Instructions  (From admission, onward)         Start     Ordered   09/24/19 0000  Discharge wound care:       Comments: Per Rafael Hernandez   09/24/19 1229          Contact information for after-discharge care    [facility] .   Service: Skilled Nursing Contact information: [place] [phone]                 Allergies  Allergen Reactions  . Penicillins Itching    Did it involve swelling of the face/tongue/throat, SOB, or low BP? No Did it involve sudden or severe rash/hives, skin peeling, or any reaction on the inside of your mouth or nose? Yes Did you need to seek medical attention at a hospital or doctor's office? No When did it last happen? If all above answers are "NO", may proceed with cephalosporin use.    Consultations: None  Procedures/Studies: DG Chest 1 View  Result Date: 09/21/2019 CLINICAL DATA:  Follow-up pneumothorax EXAM: CHEST  1 VIEW COMPARISON:  09/19/2019 FINDINGS: Cardiac shadow is stable. Aortic calcifications are again seen. Multiple right-sided rib fractures are again identified and stable. Minimal right apical pneumothorax is noted stable from the prior exam given some change in the patient positioning. Small right-sided pleural effusion is noted. No focal infiltrate is seen. No other focal abnormality is noted. IMPRESSION: Stable right rib fractures with small right apical pneumothorax. Electronically Signed   By: Inez Catalina M.D.   On: 09/21/2019 09:38   DG Chest 1 View  Result Date: 09/14/2019 CLINICAL DATA:  Right pneumothorax status post right chest tube placement EXAM: CHEST  1 VIEW COMPARISON:  09/13/2019 FINDINGS: Single frontal view of the chest demonstrates stable position of the right-sided chest tube. There is trace residual right apical pneumothorax, volume estimated far less than 5%. No acute airspace disease or effusion. Multiple right-sided rib fractures are again seen unchanged. Subcutaneous gas within the right lateral chest wall consistent with chest tube placement. IMPRESSION: 1. Trace residual right apical pneumothorax, with stable position of right chest tube. 2. Stable right-sided rib fractures. 3. Subcutaneous emphysema right chest wall, stable. Electronically Signed    By: Randa Ngo M.D.   On: 09/14/2019 02:29   DG Chest 1 View  Result Date: 09/12/2019 CLINICAL DATA:  Status post fall with pneumothorax seen on neck CT. EXAM: CHEST  1 VIEW COMPARISON:  August 21, 2019 FINDINGS: The lungs are hyperinflated. There is no evidence of acute infiltrate or pleural effusion. A moderate size right-sided pneumothorax is seen along the right apex and lateral aspect of the mid and upper right lung. The heart size and mediastinal contours  are within normal limits. There is marked severity calcification of the aortic arch. The visualized skeletal structures are unremarkable. IMPRESSION: 1. Moderate size right-sided pneumothorax. 2. Findings consistent with COPD. 3. Aortic atherosclerosis. 4. No acute infiltrate. Electronically Signed   By: Virgina Norfolk M.D.   On: 09/12/2019 16:35   DG Shoulder Right  Result Date: 09/21/2019 CLINICAL DATA:  Recent fall with right shoulder pain, initial encounter EXAM: RIGHT SHOULDER - 2+ VIEW COMPARISON:  None. FINDINGS: Multiple right-sided rib fractures are again identified. The apical pneumothorax is not as well appreciated on this exam. There are degenerative changes of the glenohumeral joint as well as high-riding humeral head consistent with underlying chronic rotator cuff injury. No other focal abnormality is noted. IMPRESSION: Degenerative changes with changes consistent with chronic rotator cuff injury. Multiple right rib fractures. Electronically Signed   By: Inez Catalina M.D.   On: 09/21/2019 09:39   DG Elbow 2 Views Right  Result Date: 09/12/2019 CLINICAL DATA:  Fall with trauma to the elbow. EXAM: RIGHT ELBOW - 2 VIEW COMPARISON:  None. FINDINGS: There is no evidence of fracture, dislocation, or joint effusion. There is no evidence of arthropathy or other focal bone abnormality. Soft tissues are unremarkable. IMPRESSION: Negative. Electronically Signed   By: Nelson Chimes M.D.   On: 09/12/2019 15:05   CT Head Wo  Contrast  Result Date: 09/19/2019 CLINICAL DATA:  Fall EXAM: CT HEAD WITHOUT CONTRAST TECHNIQUE: Contiguous axial images were obtained from the base of the skull through the vertex without intravenous contrast. COMPARISON:  09/12/2019 FINDINGS: Brain: There is atrophy and chronic small vessel disease changes. No acute intracranial abnormality. Specifically, no hemorrhage, hydrocephalus, mass lesion, acute infarction, or significant intracranial injury. Vascular: No hyperdense vessel or unexpected calcification. Skull: No acute calvarial abnormality. Sinuses/Orbits: Visualized paranasal sinuses and mastoids clear. Orbital soft tissues unremarkable. Other: None IMPRESSION: Atrophy, chronic microvascular disease. No acute intracranial abnormality. Electronically Signed   By: Rolm Baptise M.D.   On: 09/19/2019 20:14   CT Head Wo Contrast  Addendum Date: 09/12/2019   ADDENDUM REPORT: 09/12/2019 15:24 ADDENDUM: These results were called by telephone at the time of interpretation on 09/12/2019 at 3:23 pm to provider Conni Slipper , who verbally acknowledged these results. Electronically Signed   By: Fidela Salisbury MD   On: 09/12/2019 15:24   Result Date: 09/12/2019 CLINICAL DATA:  Multiple falls, head injury, scalp hematoma EXAM: CT HEAD WITHOUT CONTRAST CT CERVICAL SPINE WITHOUT CONTRAST TECHNIQUE: Multidetector CT imaging of the head and cervical spine was performed following the standard protocol without intravenous contrast. Multiplanar CT image reconstructions of the cervical spine were also generated. COMPARISON:  None. FINDINGS: CT HEAD FINDINGS Brain: Moderate parenchymal volume loss is commensurate with the patient's age. Moderate periventricular and subcortical white matter changes are present likely reflecting the sequela of small vessel ischemia. Remote lacunar infarct noted within the left basal ganglia no evidence of acute intracranial hemorrhage or infarct. No abnormal mass effect or midline shift. No  abnormal intra or extra-axial mass lesion. Ventricular size is normal and commensurate with the degree of volume loss. Cerebellum is unremarkable. Vascular: No asymmetric hyperdense vasculature at the skull base. Skull: Intact Sinuses/Orbits: Next small air-fluid level noted within the left sphenoid sinus. Small mucous retention cyst noted within the right sphenoid sinus. Remaining paranasal sinuses are unremarkable. Orbits are unremarkable. Other: Mastoid air cells and middle ear cavities are clear. Small right temporal scalp hematoma is present with associated punctate subcutaneous gas in keeping with  history of local trauma. CT CERVICAL SPINE FINDINGS Alignment: There is reversal of the normal cervical lordosis without evidence of focal angulation or listhesis. Skull base and vertebrae: The craniocervical junction is unremarkable. Atlantodental interval is normal. There is no acute fracture of the cervical spine. No lytic or blastic bone lesions are seen. Soft tissues and spinal canal: No canal hematoma identified. The paraspinal soft tissues are unremarkable. Disc levels: Review of the sagittal reformats demonstrates severe intervertebral disc space narrowing and endplate remodeling at O7-0, C5-6, and C6-7 in keeping with changes of defense degenerative disc disease. Milder degenerative changes are noted at C3-4. Advanced degenerative changes are seen at the C1-2 articulation involving the left lateral mass and atlantodental articulation. Vertebral body height has been preserved. Review of the axial images demonstrates multilevel uncovertebral and facet arthrosis resulting in multilevel neural foraminal narrowing, most severe on the left at C4-5, bilaterally at C5-6, bilaterally at C6-7 and, to a milder extent, on the left at C3-4 and on the right at C4-5. There is moderate central canal stenosis at C4-5, C5-6, and C6-7 secondary to posterior disc osteophyte complexes which result in flattening of the thecal sac  at these levels. Upper chest: Tiny right apical pneumothorax is present Other: None significant IMPRESSION: 1. Small right temporal scalp hematoma with associated punctate subcutaneous gas in keeping with history of local trauma. 2. No acute intracranial abnormality. 3. No acute fracture or malalignment of the cervical spine. 4. Advanced degenerative disc disease and facet arthrosis throughout the cervical spine, most severe at C4-5, C5-6, and C6-7 where there is moderate central canal stenosis and multilevel neural foraminal narrowing. 5. Tiny right apical pneumothorax. Electronically Signed: By: Fidela Salisbury MD On: 09/12/2019 15:14   CT Cervical Spine Wo Contrast  Result Date: 09/19/2019 CLINICAL DATA:  Right-sided neck pain after unwitnessed fall earlier today. EXAM: CT CERVICAL SPINE WITHOUT CONTRAST TECHNIQUE: Multidetector CT imaging of the cervical spine was performed without intravenous contrast. Multiplanar CT image reconstructions were also generated. COMPARISON:  09/12/2019 FINDINGS: Alignment: There is reversal of the usual cervical lordosis with slight anterior subluxation of C3 on C4. This appearance is unchanged since the previous study and likely represents degenerative change. C1-2 articulation appears intact. Skull base and vertebrae: No vertebral compression deformities. No focal bone lesion or bone destruction. Bone cortex appears intact. Posterior elements and skull base appear intact. Soft tissues and spinal canal: No prevertebral soft tissue swelling. No abnormal paraspinal soft tissue mass or infiltration. Disc levels: Degenerative changes throughout the cervical spine with narrowed interspaces and endplate hypertrophic change. Prominent degenerative changes in the posterior elements. Ligamentous calcification at C1-2. Upper chest: A small right apical pneumothorax is again demonstrated, possibly increased since the previous study. Other: None. IMPRESSION: 1. Degenerative changes  throughout the cervical spine. Reversal of the usual cervical lordosis with slight anterior subluxation of C3 on C4. No acute displaced fractures identified. 2. Small right apical pneumothorax is again demonstrated, possibly increased since previous study. Electronically Signed   By: Lucienne Capers M.D.   On: 09/19/2019 23:20   CT Cervical Spine Wo Contrast  Addendum Date: 09/12/2019   ADDENDUM REPORT: 09/12/2019 15:24 ADDENDUM: These results were called by telephone at the time of interpretation on 09/12/2019 at 3:23 pm to provider Conni Slipper , who verbally acknowledged these results. Electronically Signed   By: Fidela Salisbury MD   On: 09/12/2019 15:24   Result Date: 09/12/2019 CLINICAL DATA:  Multiple falls, head injury, scalp hematoma EXAM: CT HEAD WITHOUT  CONTRAST CT CERVICAL SPINE WITHOUT CONTRAST TECHNIQUE: Multidetector CT imaging of the head and cervical spine was performed following the standard protocol without intravenous contrast. Multiplanar CT image reconstructions of the cervical spine were also generated. COMPARISON:  None. FINDINGS: CT HEAD FINDINGS Brain: Moderate parenchymal volume loss is commensurate with the patient's age. Moderate periventricular and subcortical white matter changes are present likely reflecting the sequela of small vessel ischemia. Remote lacunar infarct noted within the left basal ganglia no evidence of acute intracranial hemorrhage or infarct. No abnormal mass effect or midline shift. No abnormal intra or extra-axial mass lesion. Ventricular size is normal and commensurate with the degree of volume loss. Cerebellum is unremarkable. Vascular: No asymmetric hyperdense vasculature at the skull base. Skull: Intact Sinuses/Orbits: Next small air-fluid level noted within the left sphenoid sinus. Small mucous retention cyst noted within the right sphenoid sinus. Remaining paranasal sinuses are unremarkable. Orbits are unremarkable. Other: Mastoid air cells and middle ear  cavities are clear. Small right temporal scalp hematoma is present with associated punctate subcutaneous gas in keeping with history of local trauma. CT CERVICAL SPINE FINDINGS Alignment: There is reversal of the normal cervical lordosis without evidence of focal angulation or listhesis. Skull base and vertebrae: The craniocervical junction is unremarkable. Atlantodental interval is normal. There is no acute fracture of the cervical spine. No lytic or blastic bone lesions are seen. Soft tissues and spinal canal: No canal hematoma identified. The paraspinal soft tissues are unremarkable. Disc levels: Review of the sagittal reformats demonstrates severe intervertebral disc space narrowing and endplate remodeling at N3-6, C5-6, and C6-7 in keeping with changes of defense degenerative disc disease. Milder degenerative changes are noted at C3-4. Advanced degenerative changes are seen at the C1-2 articulation involving the left lateral mass and atlantodental articulation. Vertebral body height has been preserved. Review of the axial images demonstrates multilevel uncovertebral and facet arthrosis resulting in multilevel neural foraminal narrowing, most severe on the left at C4-5, bilaterally at C5-6, bilaterally at C6-7 and, to a milder extent, on the left at C3-4 and on the right at C4-5. There is moderate central canal stenosis at C4-5, C5-6, and C6-7 secondary to posterior disc osteophyte complexes which result in flattening of the thecal sac at these levels. Upper chest: Tiny right apical pneumothorax is present Other: None significant IMPRESSION: 1. Small right temporal scalp hematoma with associated punctate subcutaneous gas in keeping with history of local trauma. 2. No acute intracranial abnormality. 3. No acute fracture or malalignment of the cervical spine. 4. Advanced degenerative disc disease and facet arthrosis throughout the cervical spine, most severe at C4-5, C5-6, and C6-7 where there is moderate central  canal stenosis and multilevel neural foraminal narrowing. 5. Tiny right apical pneumothorax. Electronically Signed: By: Fidela Salisbury MD On: 09/12/2019 15:14   DG Chest Portable 1 View  Result Date: 09/19/2019 CLINICAL DATA:  Chest pain after fall EXAM: PORTABLE CHEST 1 VIEW COMPARISON:  09/14/2019 FINDINGS: Single frontal view of the chest demonstrates a stable cardiac silhouette. No airspace disease or effusion. The trace right apical pneumothorax seen on prior examination is unchanged. Multiple displaced right posterolateral rib fractures are again noted, stable. Resolution of the subcutaneous gas seen previously. No new bony abnormalities. IMPRESSION: 1. Stable right posterolateral rib fractures and trace right apical pneumothorax. No new process. Electronically Signed   By: Randa Ngo M.D.   On: 09/19/2019 22:41   DG Chest Port 1 View  Result Date: 09/14/2019 CLINICAL DATA:  Postoperative pneumonia, diabetes mellitus,  hypertension, Alzheimer's EXAM: PORTABLE CHEST 1 VIEW COMPARISON:  Portable exam 1354 hours compared to 09/14/2019 at 0802 hours FINDINGS: Normal heart size, mediastinal contours, and pulmonary vascularity. Atherosclerotic calcification aorta. Bronchitic changes without infiltrate or pleural effusion. Persistent tiny RIGHT apex pneumothorax. No acute osseous findings. IMPRESSION: Bronchitic changes with persistent tiny RIGHT apex pneumothorax. No new abnormalities. Electronically Signed   By: Lavonia Dana M.D.   On: 09/14/2019 14:09   DG Chest Port 1 View  Result Date: 09/14/2019 CLINICAL DATA:  Pneumothorax. EXAM: PORTABLE CHEST 1 VIEW COMPARISON:  September 14, 2019. FINDINGS: The heart size and mediastinal contours are within normal limits. Left lung is clear. Right-sided chest tube has been removed. Probable stable minimal right apical pneumothorax is noted. Stable subcutaneous emphysema is seen over right lateral chest wall. Multiple mildly displaced right rib fractures are noted.  IMPRESSION: Probable stable minimal right apical pneumothorax status post right-sided chest tube removal. Multiple mildly displaced right rib fractures are noted. Aortic Atherosclerosis (ICD10-I70.0). Electronically Signed   By: Marijo Conception M.D.   On: 09/14/2019 08:22   DG Chest Port 1 View  Result Date: 09/13/2019 CLINICAL DATA:  RIGHT chest tube, RIGHT closed pneumothorax post trauma, diabetes mellitus, hypertension, Alzheimer's, EXAM: PORTABLE CHEST 1 VIEW COMPARISON:  Portable exam 1001 hours compared 09/12/2019 FINDINGS: Small caliber RIGHT thoracostomy tube unchanged. Normal heart size, mediastinal contours, and pulmonary vascularity. Emphysematous and bronchitic changes consistent with COPD. Minimal RIGHT apex pneumothorax, decreased Atherosclerotic calcification aorta. Bones demineralized with multiple lateral RIGHT rib fractures. IMPRESSION: Decreased RIGHT apical pneumothorax. No new abnormalities. Electronically Signed   By: Lavonia Dana M.D.   On: 09/13/2019 10:25   DG Chest Portable 1 View  Result Date: 09/12/2019 CLINICAL DATA:  Follow-up pneumothorax EXAM: PORTABLE CHEST 1 VIEW COMPARISON:  Earlier same day FINDINGS: Right chest tube is in place. Marked reduction in right pneumothorax. Only a tiny amount of persistent pleural air. Fracture of the right lateral third, fourth and fifth ribs. Left chest remains clear. Normal heart size. Aortic atherosclerosis as seen previously. IMPRESSION: Marked reduction in right pneumothorax with only a tiny amount of persistent pleural air. Right lateral third, fourth and fifth rib fractures. Electronically Signed   By: Nelson Chimes M.D.   On: 09/12/2019 19:18   US Abdomen Limited RUQ  Result Date: 09/21/2019 CLINICAL DATA:  Abnormal LFTs EXAM: ULTRASOUND ABDOMEN LIMITED RIGHT UPPER QUADRANT COMPARISON:  None. FINDINGS: Gallbladder: No gallstones or wall thickening visualized. No sonographic Murphy sign noted by sonographer. Common bile duct: Diameter:  5 mm Liver: The liver parenchyma is coarsened and heterogeneous without evidence for discrete hepatic mass. Portal vein is patent on color Doppler imaging with normal direction of blood flow towards the liver. Other: Incidentally noted is a right-sided pleural effusion. IMPRESSION: 1. No evidence for cholelithiasis or acute cholecystitis. 2. Coarsened and heterogeneous appearance of the liver which may be secondary to underlying hepatocellular disease. 3. Incidentally noted right-sided pleural effusion. Electronically Signed   By: Constance Holster M.D.   On: 09/21/2019 18:56    Subjective: And examined at bedside and she had just eaten her breakfast.  Had no complaints.  Had some intermittent pain on the right side.  No nausea or vomiting.  Feels well and she is stable to be discharged to SNF facility for continued rehab and resumption of hospice.  Discharge Exam: Vitals:   09/24/19 0539 09/24/19 1144  BP: (!) 154/88 105/66  Pulse: 66 81  Resp: 15 20  Temp: (!) 97.3  F (36.3 C) 97.6 F (36.4 C)  SpO2: 94% 99%   Vitals:   09/23/19 1141 09/23/19 2039 09/24/19 0539 09/24/19 1144  BP: (!) 148/66 134/78 (!) 154/88 105/66  Pulse: 74 84 66 81  Resp: 18 18 15 20   Temp: 97.9 F (36.6 C) 98.2 F (36.8 C) (!) 97.3 F (36.3 C) 97.6 F (36.4 C)  TempSrc: Oral Oral Oral Oral  SpO2: 98% 95% 94% 99%  Weight:      Height:       General: Pt is alert, awake, not in acute distress Cardiovascular: RRR, S1/S2 +, no rubs, no gallops Respiratory: Diminished bilaterally worse on the Right, no wheezing, no rhonchi Abdominal: Soft, NT, ND, bowel sounds + Extremities: no edema, no cyanosis but has bruising all over her body especially in her face  The results of significant diagnostics from this hospitalization (including imaging, microbiology, ancillary and laboratory) are listed below for reference.    Microbiology: Recent Results (from the past 240 hour(s))  Urine Culture     Status: Abnormal    Collection Time: 09/19/19  7:47 PM   Specimen: Urine, Random  Result Value Ref Range Status   Specimen Description   Final    URINE, RANDOM Performed at [facility], [address], [place], [place] [address]    Special Requests   Final    NONE Performed at [facility], [address] [place], [place] [address]    Culture >=100,000 COLONIES/mL ENTEROCOCCUS FAECALIS (A)  Final   Report Status 09/21/2019 FINAL  Final   Organism ID, Bacteria ENTEROCOCCUS FAECALIS (A)  Final      Susceptibility   Enterococcus faecalis - MIC*    AMPICILLIN <=2 SENSITIVE Sensitive     NITROFURANTOIN <=16 SENSITIVE Sensitive     VANCOMYCIN 1 SENSITIVE Sensitive     * >=100,000 COLONIES/mL ENTEROCOCCUS FAECALIS  Blood culture (routine x 2)     Status: None (Preliminary result)   Collection Time: 09/20/19 12:42 AM   Specimen: BLOOD  Result Value Ref Range Status   Specimen Description BLOOD RIGHT ARM  Final   Special Requests   Final    BOTTLES DRAWN AEROBIC AND ANAEROBIC Blood Culture results may not be optimal due to an inadequate volume of blood received in culture bottles   Culture   Final    NO GROWTH 4 DAYS Performed at [facility], [address] [place], [place] [address]    Report Status PENDING  Incomplete  Blood culture (routine x 2)     Status: None (Preliminary result)   Collection Time: 09/20/19 12:42 AM   Specimen: BLOOD  Result Value Ref Range Status   Specimen Description BLOOD RIGHT ARM  Final   Special Requests   Final    BOTTLES DRAWN AEROBIC AND ANAEROBIC Blood Culture adequate volume   Culture   Final    NO GROWTH 4 DAYS Performed at [facility], [address], [place],  [address]    Report Status PENDING  Incomplete  SARS Coronavirus 2 by RT PCR (hospital order, performed in [place] hospital lab) Nasopharyngeal Nasopharyngeal Swab     Status: None   Collection Time: 09/20/19  6:19 AM   Specimen: Nasopharyngeal Swab   Result Value Ref Range Status   SARS Coronavirus 2 NEGATIVE NEGATIVE Final    Comment: (NOTE) SARS-CoV-2 target nucleic acids are NOT DETECTED.  The SARS-CoV-2 RNA is generally detectable in upper and lower respiratory specimens during the acute phase  of infection. The lowest concentration of SARS-CoV-2 viral copies this assay can detect is 250 copies / mL. A negative result does not preclude SARS-CoV-2 infection and should not be used as the sole basis for treatment or other patient management decisions.  A negative result may occur with improper specimen collection / handling, submission of specimen other than nasopharyngeal swab, presence of viral mutation(s) within the areas targeted by this assay, and inadequate number of viral copies (<250 copies / mL). A negative result must be combined with clinical observations, patient history, and epidemiological information.  Fact Sheet for Patients:   StrictlyIdeas.no  Fact Sheet for Healthcare Providers: BankingDealers.co.za  This test is not yet approved or  cleared by the [place] FDA and has been authorized for detection and/or diagnosis of SARS-CoV-2 by FDA under an Emergency Use Authorization (EUA).  This EUA will remain in effect (meaning this test can be used) for the duration of the COVID-19 declaration under Section 564(b)(1) of the Act, 21 U.S.C. section 360bbb-3(b)(1), unless the authorization is terminated or revoked sooner.  Performed at [facility], [address] [place], [place] [address]   MRSA PCR Screening     Status: None   Collection Time: 09/20/19  2:51 PM   Specimen: Nasopharyngeal  Result Value Ref Range Status   MRSA by PCR NEGATIVE NEGATIVE Final    Comment:        The GeneXpert MRSA Assay (FDA approved for NASAL specimens only), is one component of a comprehensive MRSA colonization surveillance program. It is not intended to diagnose  MRSA infection nor to guide or monitor treatment for MRSA infections. Performed at [facility], [address] [place], [place] [address]   SARS Coronavirus 2 by RT PCR (hospital order, performed in [facility] hospital lab) Nasopharyngeal Nasopharyngeal Swab     Status: None   Collection Time: 09/23/19  5:55 PM   Specimen: Nasopharyngeal Swab  Result Value Ref Range Status   SARS Coronavirus 2 NEGATIVE NEGATIVE Final    Comment: (NOTE) SARS-CoV-2 target nucleic acids are NOT DETECTED.  The SARS-CoV-2 RNA is generally detectable in upper and lower respiratory specimens during the acute phase of infection. The lowest concentration of SARS-CoV-2 viral copies this assay can detect is 250 copies / mL. A negative result does not preclude SARS-CoV-2 infection and should not be used as the sole basis for treatment or other patient management decisions.  A negative result may occur with improper specimen collection / handling, submission of specimen other than nasopharyngeal swab, presence of viral mutation(s) within the areas targeted by this assay, and inadequate number of viral copies (<250 copies / mL). A negative result must be combined with clinical observations, patient history, and epidemiological information.  Fact Sheet for Patients:   StrictlyIdeas.no  Fact Sheet for Healthcare Providers: BankingDealers.co.za  This test is not yet approved or  cleared by the [place] FDA and has been authorized for detection and/or diagnosis of SARS-CoV-2 by FDA under an Emergency Use Authorization (EUA).  This EUA will remain in effect (meaning this test can be used) for the duration of the COVID-19 declaration under Section 564(b)(1) of the Act, 21 U.S.C. section 360bbb-3(b)(1), unless the authorization is terminated or revoked sooner.  Performed at [facility], [address] [place], [place] [address]      Labs: BNP (last 3 results) Recent Labs    09/12/19 1647  BNP 767.3*   Basic Metabolic Panel: Recent Labs  Lab 09/20/19 0521  09/21/19 9774 09/22/19 0408 09/23/19 0452 09/24/19 0459  NA 133* 133* 134* 133* 132*  K 4.1 3.8 3.9 4.4 3.9  CL 98 97* 101 101 98  CO2 24 27 26 24 26   GLUCOSE 221* 160* 170* 155* 152*  BUN 20 16 16 16 16   CREATININE 0.73 0.59 0.58 0.57 0.62  CALCIUM 9.5 9.3 8.7* 9.4 9.8  MG  --  1.5* 1.7 2.0 1.8  PHOS  --  2.3* 2.5 2.2* 2.7   Liver Function Tests: Recent Labs  Lab 09/21/19 0635 09/22/19 0408 09/23/19 0452 09/24/19 0459  AST 56* 71* 69* 61*  ALT 67* 78* 78* 76*  ALKPHOS 156* 166* 218* 250*  BILITOT 0.8 0.7 0.7 0.8  PROT 5.8* 5.3* 5.8* 6.1*  ALBUMIN 3.0* 2.7* 2.9* 3.2*   No results for input(s): LIPASE, AMYLASE in the last 168 hours. No results for input(s): AMMONIA in the last 168 hours. CBC: Recent Labs  Lab 09/21/19 0635 09/22/19 0408 09/23/19 0452 09/24/19 0459 09/24/19 1106  WBC 9.3 9.6 8.9 11.7* 11.6*  NEUTROABS 6.6 6.7 5.9 8.2* 8.9*  HGB 10.0* 9.0* 9.8* 10.2* 9.7*  HCT 29.7* 26.8* 29.3* 30.5* 29.1*  MCV 91.7 91.2 91.6 91.9 92.1  PLT 282 264 306 394 366   Cardiac Enzymes: No results for input(s): CKTOTAL, CKMB, CKMBINDEX, TROPONINI in the last 168 hours. BNP: Invalid input(s): POCBNP CBG: Recent Labs  Lab 09/23/19 1646 09/23/19 2027 09/23/19 2200 09/24/19 0735 09/24/19 1141  GLUCAP 214* 132* 179* 131* 242*   D-Dimer No results for input(s): DDIMER in the last 72 hours. Hgb A1c No results for input(s): HGBA1C in the last 72 hours. Lipid Profile No results for input(s): CHOL, HDL, LDLCALC, TRIG, CHOLHDL, LDLDIRECT in the last 72 hours. Thyroid function studies No results for input(s): TSH, T4TOTAL, T3FREE, THYROIDAB in the last 72 hours.  Invalid input(s): FREET3 Anemia work up No results for input(s): VITAMINB12, FOLATE, FERRITIN, TIBC, IRON, RETICCTPCT in the last 72 hours. Urinalysis    Component Value  Date/Time   COLORURINE YELLOW (A) 09/19/2019 1947   APPEARANCEUR HAZY (A) 09/19/2019 1947   APPEARANCEUR Clear 01/09/2013 1900   LABSPEC 1.017 09/19/2019 1947   LABSPEC 1.016 01/09/2013 1900   PHURINE 6.0 09/19/2019 1947   GLUCOSEU >=500 (A) 09/19/2019 1947   GLUCOSEU >=500 01/09/2013 1900   HGBUR LARGE (A) 09/19/2019 1947   BILIRUBINUR NEGATIVE 09/19/2019 1947   BILIRUBINUR Negative 01/09/2013 [place] 09/19/2019 [place] NEGATIVE 09/19/2019 1947   NITRITE NEGATIVE 09/19/2019 1947   LEUKOCYTESUR TRACE (A) 09/19/2019 1947   LEUKOCYTESUR 1+ 01/09/2013 1900   Sepsis Labs Invalid input(s): PROCALCITONIN,  WBC,  LACTICIDVEN Microbiology Recent Results (from the past 240 hour(s))  Urine Culture     Status: Abnormal   Collection Time: 09/19/19  7:47 PM   Specimen: Urine, Random  Result Value Ref Range Status   Specimen Description   Final    URINE, RANDOM Performed at [facility], [address], [place], [place] [address]    Special Requests   Final    NONE Performed at [facility], [address] [place], [place] [address]    Culture >=100,000 COLONIES/mL ENTEROCOCCUS FAECALIS (A)  Final   Report Status 09/21/2019 FINAL  Final   Organism ID, Bacteria ENTEROCOCCUS FAECALIS (A)  Final      Susceptibility   Enterococcus faecalis - MIC*    AMPICILLIN <=2 SENSITIVE Sensitive     NITROFURANTOIN <=16 SENSITIVE Sensitive  VANCOMYCIN 1 SENSITIVE Sensitive     * >=100,000 COLONIES/mL ENTEROCOCCUS FAECALIS  Blood culture (routine x 2)     Status: None (Preliminary result)   Collection Time: 09/20/19 12:42 AM   Specimen: BLOOD  Result Value Ref Range Status   Specimen Description BLOOD RIGHT ARM  Final   Special Requests   Final    BOTTLES DRAWN AEROBIC AND ANAEROBIC Blood Culture results may not be optimal due to an inadequate volume of blood received in culture bottles   Culture   Final    NO GROWTH 4 DAYS Performed at [facility], [address] [place], [place] [address]    Report Status PENDING  Incomplete  Blood culture (routine x 2)     Status: None (Preliminary result)   Collection Time: 09/20/19 12:42 AM   Specimen: BLOOD  Result Value Ref Range Status   Specimen Description BLOOD RIGHT ARM  Final   Special Requests   Final    BOTTLES DRAWN AEROBIC AND ANAEROBIC Blood Culture adequate volume   Culture   Final    NO GROWTH 4 DAYS Performed at [facility], [address], [place], [place] [address]    Report Status PENDING  Incomplete  SARS Coronavirus 2 by RT PCR (hospital order, performed in [place] hospital lab) Nasopharyngeal Nasopharyngeal Swab     Status: None   Collection Time: 09/20/19  6:19 AM   Specimen: Nasopharyngeal Swab  Result Value Ref Range Status   SARS Coronavirus 2 NEGATIVE NEGATIVE Final    Comment: (NOTE) SARS-CoV-2 target nucleic acids are NOT DETECTED.  The SARS-CoV-2 RNA is generally detectable in upper and lower respiratory specimens during the acute phase of infection. The lowest concentration of SARS-CoV-2 viral copies this assay can detect is 250 copies / mL. A negative result does not preclude SARS-CoV-2 infection and should not be used as the sole basis for treatment or other patient management decisions.  A negative result may occur with improper specimen collection / handling, submission of specimen other than nasopharyngeal swab, presence of viral mutation(s) within the areas targeted by this assay, and inadequate number of viral copies (<250 copies / mL). A negative result must be combined with clinical observations, patient history, and epidemiological information.  Fact Sheet for Patients:   StrictlyIdeas.no  Fact Sheet for Healthcare Providers: BankingDealers.co.za  This test is not yet approved or  cleared by the [place] FDA and has been authorized for detection and/or  diagnosis of SARS-CoV-2 by FDA under an Emergency Use Authorization (EUA).  This EUA will remain in effect (meaning this test can be used) for the duration of the COVID-19 declaration under Section 564(b)(1) of the Act, 21 U.S.C. section 360bbb-3(b)(1), unless the authorization is terminated or revoked sooner.  Performed at [facility], [address] [place], [place] [address]   MRSA PCR Screening     Status: None   Collection Time: 09/20/19  2:51 PM   Specimen: Nasopharyngeal  Result Value Ref Range Status   MRSA by PCR NEGATIVE NEGATIVE Final    Comment:        The GeneXpert MRSA Assay (FDA approved for NASAL specimens only), is one component of a comprehensive MRSA colonization surveillance program. It is not intended to diagnose MRSA infection nor to guide or monitor treatment for MRSA infections. Performed at [facility], [address] [place],  [address]   SARS Coronavirus 2 by RT PCR (hospital order, performed in [place]  lab) Nasopharyngeal Nasopharyngeal Swab     Status: None   Collection Time: 09/23/19  5:55 PM   Specimen: Nasopharyngeal Swab  Result Value Ref Range Status   SARS Coronavirus 2 NEGATIVE NEGATIVE Final    Comment: (NOTE) SARS-CoV-2 target nucleic acids are NOT DETECTED.  The SARS-CoV-2 RNA is generally detectable in upper and lower respiratory specimens during the acute phase of infection. The lowest concentration of SARS-CoV-2 viral copies this assay can detect is 250 copies / mL. A negative result does not preclude SARS-CoV-2 infection and should not be used as the sole basis for treatment or other patient management decisions.  A negative result may occur with improper specimen collection / handling, submission of specimen other than nasopharyngeal swab, presence of viral mutation(s) within the areas targeted by this assay, and inadequate number of viral copies (<250 copies / mL). A negative result  must be combined with clinical observations, patient history, and epidemiological information.  Fact Sheet for Patients:   StrictlyIdeas.no  Fact Sheet for Healthcare Providers: BankingDealers.co.za  This test is not yet approved or  cleared by the [place] FDA and has been authorized for detection and/or diagnosis of SARS-CoV-2 by FDA under an Emergency Use Authorization (EUA).  This EUA will remain in effect (meaning this test can be used) for the duration of the COVID-19 declaration under Section 564(b)(1) of the Act, 21 U.S.C. section 360bbb-3(b)(1), unless the authorization is terminated or revoked sooner.  Performed at [facility], [address] [place], [place] [address]    Time coordinating discharge: 35 minutes  SIGNED:  Kerney Elbe, DO [facility] 09/24/2019, 1:00 PM Pager is on [place]  If 7PM-7AM, please contact night-coverage [email or web address]

## 2019-09-24 NOTE — Plan of Care (Signed)
Continuing with plan of care. 

## 2019-09-25 LAB — CULTURE, BLOOD (ROUTINE X 2)
Culture: NO GROWTH
Culture: NO GROWTH
Special Requests: ADEQUATE

## 2019-09-27 ENCOUNTER — Encounter: Payer: Self-pay | Admitting: Nurse Practitioner

## 2019-09-27 ENCOUNTER — Other Ambulatory Visit: Payer: Self-pay

## 2019-09-27 ENCOUNTER — Non-Acute Institutional Stay: Admitting: Nurse Practitioner

## 2019-09-27 VITALS — Wt 113.0 lb

## 2019-09-27 DIAGNOSIS — Z515 Encounter for palliative care: Secondary | ICD-10-CM

## 2019-09-27 DIAGNOSIS — F0280 Dementia in other diseases classified elsewhere without behavioral disturbance: Secondary | ICD-10-CM

## 2019-09-27 NOTE — Progress Notes (Signed)
Therapist, nutritional Palliative Care Consult Note Telephone: [phone]  Fax: [phone]  PATIENT NAME: Jenna Robinson DOB: 1925-02-14 MRN: [id number]  PRIMARY CARE PROVIDER:   Dr Jenna Robinson PROVIDER:  Dr Jenna Robinson/[facility] RESPONSIBLE PARTY:   Jenna Robinson Court appointed guardian   I was asked to see Jenna Robinson by Jenna Jenna Robinson for Palliative consult for goals of care  1. Advance Care Planning; DNR, placed in vynca  2. Goals of Care: Goals include to maximize quality of life and symptom management. Our advance care planning conversation included a discussion about:     The value and importance of advance care planning   Exploration of personal, cultural or spiritual beliefs that might influence medical decisions   Exploration of goals of care in the event of a sudden injury or illness   Identification and preparation of a healthcare agent   Review and updating or creation of an  advance directive document.  3. Palliative care encounter; Palliative care encounter; Palliative medicine team will continue to support patient, patient's family, and medical team. Visit consisted of counseling and education dealing with the complex and emotionally intense issues of symptom management and palliative care in the setting of serious and potentially life-threatening illness  4. f/u 2 weeks to see weight, appetite, progress with therapy'  I spent 95 minutes providing this consultation,  Start at 11:00am. More than 50% of the time in this consultation was spent coordinating communication.   HISTORY OF PRESENT ILLNESS:  Jenna Robinson is a 86 y.o. year old female with multiple medical problems including Alzheimer's disease, diabetes, hypertension, hyperthyroidism, hypercholesterolemia, right intermittently re-entered for [facility] nail, abdominal hysterectomy, history of pneumothorax. Hospitalized 3 / 27/2021 to 3 / 30 / 2021 full code with a  cough, lost her balance with right hip CT showing extensive communicated mildly displaced greater trochanter fracture and incomplete non-displaced fracture of superior femoral neck with moderate to Advanced osteoarthritis and diffuse gluteal musculature edema with probable partial destruction of the gluteal tendon insertion site underwent surgery. Hospitalized 8 / 9 / 2021 to 8 / 13 / 2021 for altered mental status with  significant for acute metabolic encephalopathy with history of Alzheimer's dementia, dehydration, infectious process with UTI, poor oral intake. 8 / 31 / 2021 to 9 / 3 / 2021 hospitalized after a fall with multiple falls at the facility noted to have a moderate right side pneumothorax, multiple displaced right ribs, small right hematoma with chest tube placed. She returned to assisted living facility. Hospitalized 9 / 07/2019 to 9 / 12 / 2021 for unwitnessed fall at facility with UTI, CT brain so atrophy chronic microvascular disease with CT cervical spine degeneration changes throughout the cervical spine. Stable right rib fractures with small right apical pneumothorax. Palliative care did follow during hospitalization with family a niece requesting Hospice Services. For documentation spouse passed away 20 years ago does not have any children, estranged from other family members and Jenna Robinson has served as caregiver in decision-making her documentation is court-appointed Guardian. She is a DNR. Jenna [facility] was [facility] to [facility] where she currently resides. Staff endorses Jenna Robinson requires total assistance for mobility, transfers, adl's including dressing, bathing, toileting. Jenna Robinson requires assistance with feeding and appetite has slightly improved for staff. Staff endorses Jenna Robinson does have a lot of bruising to her face from previous falls. At present Jenna Robinson is lying in a Jenna Robinson in the  hall, appears, elderly but comfortable. No visitors  present. I visited and observe Jenna Robinson. We talked about how she was feeling. Jenna Robinson endorses she has been doing okay. Asked if she was having symptoms of pain and she replied "no". Asked if she had eaten breakfast and said that she did. Limited verbal discussion with cognitive impairment. Jenna. Robinson was cooperative with assessment. Emotional support provided. I called Jenna Robinson goes by [employer]. Dietitian. We talked about prior to hospitalization Jenna [facility] was residing at [facility]. Jenna [facility] had multiple falls there and increase in skill level with worsening dementia. Jenna Robinson endorses that she has now been transferred to [facility] care for transition to Long-Term care. We talked about chronic disease progression of dementia. We talked about realistic expectations. Jenna Robinson endorses she feels like she is starting to get a little bit better with improving her appetite. We did talk about Hospice Services Under the Medicare benefit. Jenna Robinson endorses prior to hospitalization they were putting community hospice in place or care. Jenna Robinson endorses since she has been in [employer] [facility] health care she would like to remain on palliative for a few weeks to see how she does and then will revisit Hospice. We talked about medical goals of care including aggressive vs. conservative versus comfort Care. Jenna. Robinson is a DNR, [place] form completed put in vynca. We talked about role of palliative care and plan of care and consent obtained. Discussed will follow up with palliative one week to see how is Jenna Robinson is doing or sooner should she declined. Jenna Robinson in agreement. Therapeutic listening and emotional support provided. Contact information. Questions answered to satisfaction. I updated nursing staff no changes at present time  goals are plan of care. Palliative Care was asked to help address goals of care.   CODE STATUS: DNR  PPS: 40% HOSPICE ELIGIBILITY/DIAGNOSIS: TBD  PAST MEDICAL  HISTORY:  Past Medical History:  Diagnosis Date  . Alzheimer disease (HCC)   . Diabetes mellitus without complication (HCC)   . Hypercholesteremia   . Hypertension   . Hyperthyroidism     SOCIAL HX:  Social History   Tobacco Use  . Smoking status: Never Smoker  . Smokeless tobacco: Never Used  Substance Use Topics  . Alcohol use: No    ALLERGIES:  Allergies  Allergen Reactions  . Penicillins Itching    Did it involve swelling of the face/tongue/throat, SOB, or low BP? No Did it involve sudden or severe rash/hives, skin peeling, or any reaction on the inside of your mouth or nose? Yes Did you need to seek medical attention at a [facility] or doctor's office? No When did it last happen? If all above answers are "NO", may proceed with cephalosporin use.     PERTINENT MEDICATIONS:  Outpatient Encounter Medications as of 09/27/2019  Medication Sig  . acetaminophen (TYLENOL) 325 MG tablet Take 650 mg by mouth every 6 (six) hours as needed for mild pain, moderate pain, fever or headache.  . alendronate (FOSAMAX) 70 MG tablet Take 1 tablet by mouth once a week. With 8 oz of water at least 30 minutes before food/meds. Do not lie down for 30 minutes after dose.  Marland Kitchen alum & mag hydroxide-simeth (MYLANTA) 200-200-20 MG/5ML suspension Take 30 mLs by mouth every 6 (six) hours as needed for indigestion or heartburn.  Marland Kitchen atenolol (TENORMIN) 25 MG tablet Take 25 mg by mouth daily.   . bisacodyl (DULCOLAX) 10 MG suppository Place 1 suppository (10  mg total) rectally daily as needed for moderate constipation.  . bismuth subsalicylate (PEPTO-BISMOL) 262 MG/15ML suspension Take 15 mLs by mouth every 6 (six) hours as needed.  . feeding supplement (BOOST HIGH PROTEIN) LIQD Take 1 Container by mouth 2 (two) times daily between meals.  Marland Kitchen morphine 10 MG/5ML solution Take 5 mLs (10 mg total) by mouth every 2 (two) hours as needed for moderate pain or severe pain.  Marland Kitchen oxybutynin (DITROPAN) 5 MG tablet  Take 5 mg by mouth 2 (two) times daily.  . polyethylene glycol (MIRALAX / GLYCOLAX) 17 g packet Take 17 g by mouth 2 (two) times daily.  Marland Kitchen senna-docusate (SENOKOT-S) 8.6-50 MG tablet Take 1 tablet by mouth at bedtime.   No facility-administered encounter medications on file as of 09/27/2019.    PHYSICAL EXAM:   General: , frail appearing, thin debilitated, chronically ill, oriented to self female; ecchymosis to face, orbits Cardiovascular: regular rate and rhythm Pulmonary: clear ant fields Extremities: muscle wasting; atrophy Neurological: generalized weakness  Chee Dimon [place], NP

## 2019-10-20 ENCOUNTER — Non-Acute Institutional Stay: Admitting: Nurse Practitioner

## 2019-10-20 ENCOUNTER — Other Ambulatory Visit: Payer: Self-pay

## 2019-10-20 ENCOUNTER — Encounter: Payer: Self-pay | Admitting: Nurse Practitioner

## 2019-10-20 DIAGNOSIS — F0280 Dementia in other diseases classified elsewhere without behavioral disturbance: Secondary | ICD-10-CM

## 2019-10-20 DIAGNOSIS — Z515 Encounter for palliative care: Secondary | ICD-10-CM

## 2019-10-20 DIAGNOSIS — G300 Alzheimer's disease with early onset: Secondary | ICD-10-CM

## 2019-10-20 NOTE — Progress Notes (Signed)
Therapist, nutritional Palliative Care Consult Note Telephone: [phone]  Fax: [phone]  PATIENT NAME: Jenna Robinson DOB: 1925/08/25 MRN: [id number]  PRIMARY CARE PROVIDER:   Dr Kathleen Lime PROVIDER:  Dr Hodges/[facility] RESPONSIBLE PARTY:   Ida Rogue Court appointed guardian   I was asked to see Jenna Robinson by Dr Yetta Flock for Palliative consult for goals of care  1.Advance Care Planning; DNR,   2. Goals of Care: Goals include to maximize quality of life and symptom management. Our advance care planning conversation included a discussion about:   The value and importance of advance care planning  Exploration of personal, cultural or spiritual beliefs that might influence medical decisions  Exploration of goals of care in the event of a sudden injury or illness  Identification and preparation of a healthcare agent  Review and updating or creation of anadvance directive document.  3.Palliative care encounter; Palliative care encounter; Palliative medicine team will continue to support patient, patient's family, and medical team. Visit consisted of counseling and education dealing with the complex and emotionally intense issues of symptom management and palliative care in the setting of serious and potentially life-threatening illness  4. f/u 2 weeks to see weight, appetite, progress with therapy'  I spent 60  minutes providing this consultation,  Start at 12:00pm More than 50% of the time in this consultation was spent coordinating communication.   HISTORY OF PRESENT ILLNESS:  Jenna [facility] is a 86 y.o. year old female with multiple medical problems including Alzheimer's disease, diabetes, hypertension, hyperthyroidism, hypercholesterolemia, right intermittently re-entered for [facility] nail, abdominal hysterectomy, history of pneumothorax. Jenna Robinson continues to reside at [facility]. Jenna Robinson does require assistance transfers, mobility, bathing, dressing. She does feed herself after tray setup. Weight 113 with BMI 16.7 currently on regular diet, easy to chew, regular consistency with med plus supplement. No weight loss in 2 weeks.  10/18/2019 covid positive placed in isolation room. Staff endorses appetite has been Fair. Bruising to her face has resolved. No further Falls. Staff endorses no other changes are concerns. At present Jenna Robinson is lying in bed. Jenna Robinson appears thin, debilitated, no distress. No visitors present. Jenna Robinson remains in isolation but now has a roommate. I visited and observed Jenna Robinson.  Jenna Robinson did make eye contact with verbal cues. Jenna Robinson did reply when asked if she was having symptoms of pain or shortness of breath no. We talked about her lunch tray. Jenna Robinson was cooperative with assessment. Limited verbal discussion with cognitive impairment. Emotional support provided. No new changes to current goals or plan of care. Medical goals reviewed.  Palliative Care was asked to help to continue to address goals of care.   CODE STATUS: DNR  PPS: 30% HOSPICE ELIGIBILITY/DIAGNOSIS: TBD  PAST MEDICAL HISTORY:  Past Medical History:  Diagnosis Date  . Alzheimer disease (HCC)   . Diabetes mellitus without complication (HCC)   . Hypercholesteremia   . Hypertension   . Hyperthyroidism     SOCIAL HX:  Social History   Tobacco Use  . Smoking status: Never Smoker  . Smokeless tobacco: Never Used  Substance Use Topics  . Alcohol use: No    ALLERGIES:  Allergies  Allergen Reactions  . Penicillins Itching    Did it involve swelling of the face/tongue/throat, SOB, or low BP? No Did it involve sudden or severe rash/hives, skin peeling, or any reaction on the  inside of your mouth or nose? Yes Did you need to seek medical attention at a hospital or doctor's office? No When did it last happen? If all above answers are "NO", may  proceed with cephalosporin use.     PERTINENT MEDICATIONS:  Outpatient Encounter Medications as of 10/20/2019  Medication Sig  . acetaminophen (TYLENOL) 325 MG tablet Take 650 mg by mouth every 6 (six) hours as needed for mild pain, moderate pain, fever or headache.  . alendronate (FOSAMAX) 70 MG tablet Take 1 tablet by mouth once a week. With 8 oz of water at least 30 minutes before food/meds. Do not lie down for 30 minutes after dose.  Marland Kitchen alum & mag hydroxide-simeth (MYLANTA) 200-200-20 MG/5ML suspension Take 30 mLs by mouth every 6 (six) hours as needed for indigestion or heartburn.  Marland Kitchen atenolol (TENORMIN) 25 MG tablet Take 25 mg by mouth daily.   . bisacodyl (DULCOLAX) 10 MG suppository Place 1 suppository (10 mg total) rectally daily as needed for moderate constipation.  . bismuth subsalicylate (PEPTO-BISMOL) 262 MG/15ML suspension Take 15 mLs by mouth every 6 (six) hours as needed.  . feeding supplement (BOOST HIGH PROTEIN) LIQD Take 1 Container by mouth 2 (two) times daily between meals.  Marland Kitchen morphine 10 MG/5ML solution Take 5 mLs (10 mg total) by mouth every 2 (two) hours as needed for moderate pain or severe pain.  Marland Kitchen oxybutynin (DITROPAN) 5 MG tablet Take 5 mg by mouth 2 (two) times daily.  . polyethylene glycol (MIRALAX / GLYCOLAX) 17 g packet Take 17 g by mouth 2 (two) times daily.  Marland Kitchen senna-docusate (SENOKOT-S) 8.6-50 MG tablet Take 1 tablet by mouth at bedtime.   No facility-administered encounter medications on file as of 10/20/2019.    PHYSICAL EXAM:   General: debilitated, frail appearing, thin, confused female Cardiovascular: regular rate and rhythm Pulmonary: clear ant fields Neurological: functional quadriplegic  Kenita Bines [place], NP

## 2019-10-25 ENCOUNTER — Non-Acute Institutional Stay: Admitting: Nurse Practitioner

## 2019-10-25 ENCOUNTER — Encounter: Payer: Self-pay | Admitting: Nurse Practitioner

## 2019-10-25 VITALS — BP 148/84 | Temp 98.5°F | Wt 114.0 lb

## 2019-10-25 DIAGNOSIS — F0280 Dementia in other diseases classified elsewhere without behavioral disturbance: Secondary | ICD-10-CM

## 2019-10-25 DIAGNOSIS — Z515 Encounter for palliative care: Secondary | ICD-10-CM

## 2019-10-25 DIAGNOSIS — G300 Alzheimer's disease with early onset: Secondary | ICD-10-CM

## 2019-10-25 NOTE — Progress Notes (Signed)
Therapist, nutritional Palliative Care Consult Note Telephone: [phone]  Fax: [phone]  PATIENT NAME: Jenna [facility] DOB: March 31, 1925 MRN: [id number]  PRIMARY CARE PROVIDER:Dr Kathleen Lime PROVIDER:Dr Hodges/[facility] RESPONSIBLE PARTY:Janet ReevesCourt appointed guardian  1.Advance Care Planning;DNR,   2. Goals of Care: Goals include to maximize quality of life and symptom management. Our advance care planning conversation included a discussion about:   The value and importance of advance care planning  Exploration of personal, cultural or spiritual beliefs that might influence medical decisions  Exploration of goals of care in the event of a sudden injury or illness  Identification and preparation of a healthcare agent  Review and updating or creation of anadvance directive document.  3.Palliative care encounter; Palliative care encounter; Palliative medicine team will continue to support patient, patient's family, and medical team. Visit consisted of counseling and education dealing with the complex and emotionally intense issues of symptom management and palliative care in the setting of serious and potentially life-threatening illness  4. f/u4 weeks to see weight, appetite, progress with therapy'  I spent 50 minutes providing this consultation,  Started at 12:30pm. More than 50% of the time in this consultation was spent coordinating communication.   HISTORY OF PRESENT ILLNESS:  [facility] is a 86 y.o. year old female with multiple medical problems including Alzheimer's disease, diabetes, hypertension, hyperthyroidism, hypercholesterolemia, right intermittently re-entered for [facility] nail, abdominal hysterectomy, history of pneumothorax.  [name] continues to reside at [facility]. [name] was moved to a new room out of isolation with a roommate. [name] requires ADL  assistance for bathing, turning, positioning, dressing, toileting, feeding. She has had a 1 pound weight gain with BMI 16.8 currently on a regular diet easy to chew texture with regular liquid consistency and [employer]. No recent wounds, infections. Care plan meeting held 10 / 12 / 2021 attempted to call family with no answer. Staff endorses no other changes. At present [name] is lying in bed. [name] appears elderly but comfortable. [name] denies any concerns with limited verbal discussion, cognitive impairment. [name] was cooperative with assessment. Last Palliative visit attempted to contact POA with message left and no answer. Attempted to contact POA, no answer message left with contact information. Medical goals reviewed. Will continue to monitor, follow weights as she seems to have improved with weight gain. Most of palliative visit with Ms. [name] was supportive. No changes recommended at present time to plan of care. I updated nursing staff of palliative visit, will continue to attempt to contact [name] POA.  Palliative Care was asked to help to continue to address goals of care.   CODE STATUS: DNR  PPS: 30% HOSPICE ELIGIBILITY/DIAGNOSIS: TBD  PAST MEDICAL HISTORY:  Past Medical History:  Diagnosis Date  . Alzheimer disease (HCC)   . Diabetes mellitus without complication (HCC)   . Hypercholesteremia   . Hypertension   . Hyperthyroidism     SOCIAL HX:  Social History   Tobacco Use  . Smoking status: Never Smoker  . Smokeless tobacco: Never Used  Substance Use Topics  . Alcohol use: No    ALLERGIES:  Allergies  Allergen Reactions  . Penicillins Itching    Did it involve swelling of the face/tongue/throat, SOB, or low BP? No Did it involve sudden or severe rash/hives, skin peeling, or any reaction on the inside of your mouth or nose? Yes Did you need to seek medical attention at a  hospital or doctor's office? No When did it last happen? If all above answers are  "NO", may proceed with cephalosporin use.     PERTINENT MEDICATIONS:  Outpatient Encounter Medications as of 10/25/2019  Medication Sig  . alendronate (FOSAMAX) 70 MG tablet Take 1 tablet by mouth once a week. With 8 oz of water at least 30 minutes before food/meds. Do not lie down for 30 minutes after dose.  Marland Kitchen alum & mag hydroxide-simeth (MYLANTA) 200-200-20 MG/5ML suspension Take 30 mLs by mouth every 6 (six) hours as needed for indigestion or heartburn.  Marland Kitchen atenolol (TENORMIN) 25 MG tablet Take 25 mg by mouth daily.   . bisacodyl (DULCOLAX) 10 MG suppository Place 1 suppository (10 mg total) rectally daily as needed for moderate constipation.  . bismuth subsalicylate (PEPTO-BISMOL) 262 MG/15ML suspension Take 15 mLs by mouth every 6 (six) hours as needed.  . feeding supplement (BOOST HIGH PROTEIN) LIQD Take 1 Container by mouth 2 (two) times daily between meals.  Marland Kitchen morphine 10 MG/5ML solution Take 5 mLs (10 mg total) by mouth every 2 (two) hours as needed for moderate pain or severe pain.  Marland Kitchen oxybutynin (DITROPAN) 5 MG tablet Take 5 mg by mouth 2 (two) times daily.  . polyethylene glycol (MIRALAX / GLYCOLAX) 17 g packet Take 17 g by mouth 2 (two) times daily.  Marland Kitchen senna-docusate (SENOKOT-S) 8.6-50 MG tablet Take 1 tablet by mouth at bedtime.  Marland Kitchen acetaminophen (TYLENOL) 325 MG tablet Take 650 mg by mouth every 6 (six) hours as needed for mild pain, moderate pain, fever or headache. (Patient not taking: Reported on 10/25/2019)   No facility-administered encounter medications on file as of 10/25/2019.    PHYSICAL EXAM:   General:  frail appearing, thin, debilitated, confused female Cardiovascular: regular rate and rhythm Pulmonary: clear ant fields Neurological: functional quadriplegic  [facility] [place], NP

## 2019-10-26 ENCOUNTER — Other Ambulatory Visit: Payer: Self-pay

## 2019-11-29 ENCOUNTER — Other Ambulatory Visit: Payer: Self-pay

## 2019-11-29 ENCOUNTER — Encounter: Payer: Self-pay | Admitting: Nurse Practitioner

## 2019-11-29 ENCOUNTER — Non-Acute Institutional Stay: Admitting: Nurse Practitioner

## 2019-11-29 VITALS — BP 110/68 | HR 82 | Wt 120.6 lb

## 2019-11-29 DIAGNOSIS — G300 Alzheimer's disease with early onset: Secondary | ICD-10-CM

## 2019-11-29 DIAGNOSIS — F0280 Dementia in other diseases classified elsewhere without behavioral disturbance: Secondary | ICD-10-CM

## 2019-11-29 DIAGNOSIS — Z515 Encounter for palliative care: Secondary | ICD-10-CM

## 2019-11-29 NOTE — Progress Notes (Signed)
Therapist, nutritional Palliative Care Consult Note Telephone: [phone]  Fax: [phone]  PATIENT NAME: Jenna Robinson DOB: [birth year] MRN: [id number]  PRIMARY CARE PROVIDER:   Mickel Fuchs, MD  REFERRING PROVIDER:  Mickel Fuchs, MD [address] [place],  [place] [address]  PRIMARY CARE PROVIDER:Dr Kathleen Lime PROVIDER:Dr Hodges/[facility] RESPONSIBLE PARTY:Janet ReevesCourt appointed guardian  1.Advance Care Planning;DNR,   2. Goals of Care: Goals include to maximize quality of life and symptom management. Our advance care planning conversation included a discussion about:   The value and importance of advance care planning  Exploration of personal, cultural or spiritual beliefs that might influence medical decisions  Exploration of goals of care in the event of a sudden injury or illness  Identification and preparation of a healthcare agent  Review and updating or creation of anadvance directive document.  3.Palliative care encounter; Palliative care encounter; Palliative medicine team will continue to support patient, patient's family, and medical team. Visit consisted of counseling and education dealing with the complex and emotionally intense issues of symptom management and palliative care in the setting of serious and potentially life-threatening illness  4. f/u4 weeks to see weight, appetite, progress with therapy'  I spent 50 minutes providing this consultation,  Starting at 10:15am. More than 50% of the time in this consultation was spent coordinating communication.   HISTORY OF PRESENT ILLNESS:  Jenna Robinson is a 86 y.o. year old female with multiple medical problems including Alzheimer's disease, diabetes, hypertension, hyperthyroidism, hypercholesterolemia, right intermittently re-entered for [facility] nail, abdominal hysterectomy, history of pneumothorax. Ms. Fedder  continues to reside [facility] Nursing facility at [facility]. Ms. Waren does require assistance with transfers, mobility as she does takes a few steps with her walker wood staff. Ms. Nethery requires assistance with bathing, dressing, the toileting. Ms Wildermuth requires assistance with feeding as current weight is 120.6 lb. She is had a 6.1lb weight loss over 3 weeks currently on a regular diet easy to texture regular liquid consistency with ensure plus supplement. Staff endorses no recent falls, wounds, infections. Staff endorses no other changes or concerns. I present Ms. Ontiveros is lying in bed, appears to billeted, elderly. Ms. Depree does make eye contact with verbal cues and answer simple questions. Ms. Rohwer did answer simple questions such as if she was in pain replying "no". We talked about her appetite so she did not recall what she had to eat for breakfast. Palliative care visit though was cooperative with assessment. Limited discussion with cognitive impairment. Medical goals reviewed. Will continue to follow, monitoring weights, for decline, disease progression, transition to LTC. Will f/u with guardian, though no new changes to poc. I updated nursing staff.  Palliative Care was asked to help to continue to address goals of care.   CODE STATUS: DNR  PPS: 40% HOSPICE ELIGIBILITY/DIAGNOSIS: TBD  PAST MEDICAL HISTORY:  Past Medical History:  Diagnosis Date  . Alzheimer disease (HCC)   . Diabetes mellitus without complication (HCC)   . Hypercholesteremia   . Hypertension   . Hyperthyroidism     SOCIAL HX:  Social History   Tobacco Use  . Smoking status: Never Smoker  . Smokeless tobacco: Never Used  Substance Use Topics  . Alcohol use: No    ALLERGIES:  Allergies  Allergen Reactions  . Penicillins Itching    Did it involve swelling of the face/tongue/throat, SOB, or low BP? No Did it involve  sudden or severe rash/hives, skin peeling, or any reaction on  the inside of your mouth or nose? Yes Did you need to seek medical attention at a hospital or doctor's office? No When did it last happen? If all above answers are "NO", may proceed with cephalosporin use.      PHYSICAL EXAM:   General: NAD, frail appearing, thin elderly female Cardiovascular: regular rate and rhythm Pulmonary: clear ant fields Neurological: generalized weakness Juaquin Ludington Prince Rome, NP

## 2020-01-03 ENCOUNTER — Other Ambulatory Visit: Payer: Self-pay

## 2020-01-03 ENCOUNTER — Non-Acute Institutional Stay: Payer: PRIVATE HEALTH INSURANCE | Admitting: Nurse Practitioner

## 2020-01-03 ENCOUNTER — Encounter: Payer: Self-pay | Admitting: Nurse Practitioner

## 2020-01-03 DIAGNOSIS — Z515 Encounter for palliative care: Secondary | ICD-10-CM

## 2020-01-03 DIAGNOSIS — G300 Alzheimer's disease with early onset: Secondary | ICD-10-CM

## 2020-01-03 NOTE — Progress Notes (Signed)
Therapist, nutritional Palliative Care Consult Note Telephone: [phone]  Fax: [phone]  PATIENT NAME: Jenna Robinson DOB: February 17, 1925 MRN: [id number]  PRIMARY CARE PROVIDER:   [facility] RESPONSIBLE PARTY:Janet ReevesCourt appointed guardian  1.Advance Care Planning;DNR,   2. Goals of Care: Goals include to maximize quality of life and symptom management. Our advance care planning conversation included a discussion about:   The value and importance of advance care planning  Exploration of personal, cultural or spiritual beliefs that might influence medical decisions  Exploration of goals of care in the event of a sudden injury or illness  Identification and preparation of a healthcare agent  Review and updating or creation of anadvance directive document.  3.Palliative care encounter; Palliative care encounter; Palliative medicine team will continue to support patient, patient's family, and medical team. Visit consisted of counseling and education dealing with the complex and emotionally intense issues of symptom management and palliative care in the setting of serious and potentially life-threatening illness  4. f/u8 weeks to see weight, appetite, progress with therapy'  I spent 60 minutes providing this consultation, starting at 10:15am. More than 50% of the time in this consultation was spent coordinating communication.   HISTORY OF PRESENT ILLNESS:  Jenna Robinson is a 86 y.o. year old female with multiple medical problems including Alzheimer's disease, diabetes, hypertension, hyperthyroidism, hypercholesterolemia, right intermittently re-entered for [facility] nail, abdominal hysterectomy, history of pneumothorax. Jenna Robinson continues to reside at [facility] Facility at [facility]. Jenna Robinson does ambulate short distances with a walker, assistance from staff. Jenna Robinson  require assistance for adl's including bathing, dressing. Jenna Robinson does feed herself with last dietitian note 12 / 17 / 2021 eating between 26 and 100%. No swallowing issues. 7.9 lb weight gain over the last 3 months with BMI 17.8. Jenna Robinson remains on a regular diet easy to chew texture regular liquid consistency with ensure Plus supplement. No recent alls, wounds, infections, hospitalizations. Medical goals reviewed with DNR are in place. Noted hemoglobin A1c 9.5 with last labs in currently not diabetic medication. Staff endorses no other changes or concerns. At present Jenna Robinson is lying in bed. Jenna Robinson appears elderly, comfortable. I visited in that serve mistakes. We talked about how she was feeling. Jenna Robinson endorses that she is doing okay today. We talked about her appetite. Jenna Robinson endorses she is not hungry. Jenna Robinson endorses she was wanting to take a nap as she is a little more tired today. Jenna Robinson was cooperative with assessment. Medical goals review. Jenna Robinson  appears comfortable at present time. Discuss will follow up in 4 weeks to continue to monitor weights, disease progression. Emotional support provided. I updated nursing staff new new changes to current goals or plan of care. I have attempted to contact Jenna Robinson for further discussion of hemoglobin A1c and medical goals with interventions. I called Jenna Robinson, clinical update discussed, we talked about symptoms, weight, appetite, reviewed labs; discussed treatment options for HAIC/DM as currently on a regular diet. Decided best plan of care is to switch diet to DM, consult dietician, repeat HAIC in 3 months rather than initiating an agent at present time. I updated Jenna Miller NP/[facility] for order. Jenna Robinson endorses she is currently at home with COVID so relayed to facility will be a while before she can visit, but reachable by phone. Questions answered to satisfaction, therapeutic listening, emotional support provided, contact  information.  Recent Labs completed 12 /03/2019 with wbc 6.6, hemoglobin 14.6, hematocrit 43.2, platelets 228, sodium 136, potassium 4.5, chloride 97, Co2 26, calcium 11.9, BUN 28, creatinine 0.79, total protein 7.4, albumin 4.2, hemoglobin A1c 9.5.  Palliative Care was asked to help to continue to address goals of care.   CODE STATUS: DNR  PPS: 40% HOSPICE ELIGIBILITY/DIAGNOSIS: TBD  PAST MEDICAL HISTORY:  Past Medical History:  Diagnosis Date  . Alzheimer disease (HCC)   . Diabetes mellitus without complication (HCC)   . Hypercholesteremia   . Hypertension   . Hyperthyroidism     SOCIAL HX:  Social History   Tobacco Use  . Smoking status: Never Smoker  . Smokeless tobacco: Never Used  Substance Use Topics  . Alcohol use: No    ALLERGIES:  Allergies  Allergen Reactions  . Penicillins Itching    Did it involve swelling of the face/tongue/throat, SOB, or low BP? No Did it involve sudden or severe rash/hives, skin peeling, or any reaction on the inside of your mouth or nose? Yes Did you need to seek medical attention at a hospital or doctor's office? No When did it last happen? If all above answers are "NO", may proceed with cephalosporin use.      PHYSICAL EXAM:   General: NAD, frail appearing, thin, pleasant female Cardiovascular: regular rate and rhythm Pulmonary: clear ant fields Neurological: walks with walker; generalized weakness  Jenna Robinson Prince Rome, NP

## 2020-01-08 ENCOUNTER — Emergency Department
Admission: EM | Admit: 2020-01-08 | Discharge: 2020-01-13 | Disposition: E | Payer: Medicare Other | Attending: Emergency Medicine | Admitting: Emergency Medicine

## 2020-01-08 DIAGNOSIS — R579 Shock, unspecified: Secondary | ICD-10-CM

## 2020-01-08 DIAGNOSIS — G309 Alzheimer's disease, unspecified: Secondary | ICD-10-CM | POA: Diagnosis not present

## 2020-01-08 DIAGNOSIS — E039 Hypothyroidism, unspecified: Secondary | ICD-10-CM | POA: Diagnosis not present

## 2020-01-08 DIAGNOSIS — Z79899 Other long term (current) drug therapy: Secondary | ICD-10-CM | POA: Insufficient documentation

## 2020-01-08 DIAGNOSIS — I10 Essential (primary) hypertension: Secondary | ICD-10-CM | POA: Insufficient documentation

## 2020-01-08 DIAGNOSIS — R40243 Glasgow coma scale score 3-8, unspecified time: Secondary | ICD-10-CM

## 2020-01-08 DIAGNOSIS — E1165 Type 2 diabetes mellitus with hyperglycemia: Secondary | ICD-10-CM | POA: Diagnosis not present

## 2020-01-08 DIAGNOSIS — F0281 Dementia in other diseases classified elsewhere with behavioral disturbance: Secondary | ICD-10-CM | POA: Insufficient documentation

## 2020-01-08 LAB — COMPREHENSIVE METABOLIC PANEL
ALT: 67 U/L — ABNORMAL HIGH (ref 0–44)
AST: 95 U/L — ABNORMAL HIGH (ref 15–41)
Albumin: 3.6 g/dL (ref 3.5–5.0)
Alkaline Phosphatase: 140 U/L — ABNORMAL HIGH (ref 38–126)
Anion gap: 25 — ABNORMAL HIGH (ref 5–15)
BUN: 145 mg/dL — ABNORMAL HIGH (ref 8–23)
CO2: 13 mmol/L — ABNORMAL LOW (ref 22–32)
Calcium: 12.3 mg/dL — ABNORMAL HIGH (ref 8.9–10.3)
Chloride: 114 mmol/L — ABNORMAL HIGH (ref 98–111)
Creatinine, Ser: 4.96 mg/dL — ABNORMAL HIGH (ref 0.44–1.00)
GFR, Estimated: 8 mL/min — ABNORMAL LOW (ref >60)
Glucose, Bld: 698 mg/dL (ref 70–99)
Potassium: UNDETERMINED mmol/L (ref 3.5–5.1)
Sodium: 152 mmol/L — ABNORMAL HIGH (ref 135–145)
Total Bilirubin: 0.7 mg/dL (ref 0.3–1.2)
Total Protein: 7.5 g/dL (ref 6.5–8.1)

## 2020-01-08 LAB — CBC WITH DIFFERENTIAL/PLATELET
Abs Immature Granulocytes: 0.14 10*3/uL — ABNORMAL HIGH (ref 0.00–0.07)
Basophils Absolute: 0.1 10*3/uL (ref 0.0–0.1)
Basophils Relative: 0 %
Eosinophils Absolute: 0.1 10*3/uL (ref 0.0–0.5)
Eosinophils Relative: 0 %
HCT: 55 % — ABNORMAL HIGH (ref 36.0–46.0)
Hemoglobin: 16.6 g/dL — ABNORMAL HIGH (ref 12.0–15.0)
Immature Granulocytes: 1 %
Lymphocytes Relative: 7 %
Lymphs Abs: 1.3 10*3/uL (ref 0.7–4.0)
MCH: 29.3 pg (ref 26.0–34.0)
MCHC: 30.2 g/dL (ref 30.0–36.0)
MCV: 97 fL (ref 80.0–100.0)
Monocytes Absolute: 1.3 10*3/uL — ABNORMAL HIGH (ref 0.1–1.0)
Monocytes Relative: 8 %
Neutro Abs: 14.6 10*3/uL — ABNORMAL HIGH (ref 1.7–7.7)
Neutrophils Relative %: 84 %
Platelets: 194 10*3/uL (ref 150–400)
RBC: 5.67 MIL/uL — ABNORMAL HIGH (ref 3.87–5.11)
RDW: 14.5 % (ref 11.5–15.5)
WBC: 17.5 10*3/uL — ABNORMAL HIGH (ref 4.0–10.5)
nRBC: 0 % (ref 0.0–0.2)

## 2020-01-08 MED ORDER — MORPHINE SULFATE (PF) 4 MG/ML IV SOLN
6.0000 mg | Freq: Once | INTRAVENOUS | Status: AC
  Administered 2020-01-08: 20:00:00 6 mg via INTRAVENOUS
  Filled 2020-01-08: qty 2

## 2020-01-08 MED ORDER — LORAZEPAM 2 MG/ML IJ SOLN
2.0000 mg | Freq: Once | INTRAMUSCULAR | Status: AC
  Administered 2020-01-08: 20:00:00 2 mg via INTRAVENOUS
  Filled 2020-01-08: qty 1

## 2020-01-08 MED ORDER — SODIUM CHLORIDE 0.9 % IV BOLUS
1000.0000 mL | Freq: Once | INTRAVENOUS | Status: AC
  Administered 2020-01-08: 15:00:00 1000 mL via INTRAVENOUS

## 2020-01-08 MED ORDER — LORAZEPAM 2 MG/ML IJ SOLN
2.0000 mg | Freq: Once | INTRAMUSCULAR | Status: AC
  Administered 2020-01-08: 17:00:00 2 mg via INTRAVENOUS
  Filled 2020-01-08: qty 1

## 2020-01-08 MED ORDER — VANCOMYCIN HCL IN DEXTROSE 1-5 GM/200ML-% IV SOLN: 1000.0000 mg | Freq: Once | INTRAVENOUS | Status: DC

## 2020-01-08 MED ORDER — SODIUM CHLORIDE 0.9 % IV SOLN: 1.0000 g | Freq: Three times a day (TID) | INTRAVENOUS | Status: DC

## 2020-01-08 MED ORDER — MORPHINE SULFATE (PF) 4 MG/ML IV SOLN
6.0000 mg | Freq: Once | INTRAVENOUS | Status: AC
  Administered 2020-01-08: 16:00:00 6 mg via INTRAVENOUS
  Filled 2020-01-08: qty 2

## 2020-01-08 MED ORDER — SODIUM CHLORIDE 0.9 % IV SOLN: 1.0000 g | Freq: Once | INTRAVENOUS | Status: DC

## 2020-01-13 NOTE — Progress Notes (Signed)
CH visited briefly w/pt.'s great nephew Renae Fickle who is POA; Renae Fickle says pt. has been living at [facility] for the past few weeks; Renae Fickle is aware of pt.'s declining condition and that she will likely pass sometime tonight.  No needs at this time, but Renae Fickle grateful for visit.  CH remains available.

## 2020-01-13 NOTE — ED Triage Notes (Signed)
Pt brought in via EMS from [facility] home for unresponsiveness. Pt is unresponsive on arrival.  Poor circulation.  Unable to obtain EKG. Unable to obtain VS.   20G inserted to RFA by EMS.  Pt is DNR.  Periods of agonal breathing en route.  [facility] states CBG was 302, was above 500 at center, and family refused treatment. EMS states family is on the way, aware of situation.

## 2020-01-13 NOTE — ED Notes (Signed)
Pt asystole on monitor. No pulse present at 2019. MD aware

## 2020-01-13 NOTE — ED Notes (Signed)
Pt gold watch left on left wrist.

## 2020-01-13 NOTE — ED Notes (Addendum)
Shela Nevin, POA called by writer to inform of pts passing and to acquire funeral home release information.    Per MD, pts time of death, 2017/05/18  Tubing, lines, drains removed from pt. Pt placed into appropriate bag and labeled. Transport to [employer] for funeral home release.   Belongings left on pt include: 1 green shirt, 1 green pant and 1 yellow watch to the left wrist.

## 2020-01-13 NOTE — ED Provider Notes (Signed)
[facility] Emergency Department Provider Note  ____________________________________________  Time seen: Approximately 3:51 PM  I have reviewed the triage vital signs and the nursing notes.   HISTORY  Chief Complaint unresponsive    Level 5 Caveat: Portions of the History and Physical including HPI and review of systems are unable to be completely obtained due to patient being comatose  HPI Jenna Robinson is a 87 y.o. female with a history of Alzheimer's disease, diabetes, hypertension who was brought to the ED from skilled nursing facility due to unresponsiveness.  Reportedly blood sugar was about 500 at the nursing home.  She has a valid DNR document accompanying her.      Past Medical History:  Diagnosis Date  . Alzheimer disease (HCC)   . Diabetes mellitus without complication (HCC)   . Hypercholesteremia   . Hypertension   . Hyperthyroidism      Patient Active Problem List   Diagnosis Date Noted  . Sepsis due to gram-negative UTI (HCC) 09/20/2019  . UTI (urinary tract infection) 09/20/2019  . Fall 09/20/2019  . Goals of care, counseling/discussion   . Palliative care by specialist   . DNR (do not resuscitate)   . Chest tube in place   . Pneumothorax, closed, traumatic 09/12/2019  . Protein-calorie malnutrition, severe 08/23/2019  . Lactic acidosis 08/22/2019  . Sepsis secondary to UTI (HCC) 08/22/2019  . Acute metabolic encephalopathy 08/22/2019  . Inadequate oral intake 08/22/2019  . Dementia without behavioral disturbance (HCC) 08/22/2019  . Diabetes mellitus without complication (HCC) 08/22/2019  . Closed right hip fracture (HCC) 04/08/2019  . Hypercalcemia 05/22/2015  . Elevated troponin 01/02/2015  . Rhabdomyolysis 01/02/2015     Past Surgical History:  Procedure Laterality Date  . ABDOMINAL HYSTERECTOMY    . DILATION AND CURETTAGE, DIAGNOSTIC / THERAPEUTIC    . INTRAMEDULLARY (IM) NAIL INTERTROCHANTERIC Right  04/08/2019   Procedure: INTRAMEDULLARY (IM) NAIL INTERTROCHANTRIC;  Surgeon: Signa Kell, MD;  Location: [facility] ORS;  Service: Orthopedics;  Laterality: Right;     Prior to Admission medications   Medication Sig Start Date End Date Taking? Authorizing Provider  acetaminophen (TYLENOL) 325 MG tablet Take 650 mg by mouth every 6 (six) hours as needed for mild pain, moderate pain, fever or headache. Patient not taking: Reported on 10/25/2019    [provider]  alendronate (FOSAMAX) 70 MG tablet Take 1 tablet by mouth once a week. With 8 oz of water at least 30 minutes before food/meds. Do not lie down for 30 minutes after dose. 11/11/16   [provider]  alum & mag hydroxide-simeth (MYLANTA) 200-200-20 MG/5ML suspension Take 30 mLs by mouth every 6 (six) hours as needed for indigestion or heartburn.    [provider]  atenolol (TENORMIN) 25 MG tablet Take 25 mg by mouth daily.     [provider]  bisacodyl (DULCOLAX) 10 MG suppository Place 1 suppository (10 mg total) rectally daily as needed for moderate constipation. 09/24/19   Marguerita Merles Latif, DO  bismuth subsalicylate (PEPTO-BISMOL) 262 MG/15ML suspension Take 15 mLs by mouth every 6 (six) hours as needed.    [provider]  feeding supplement (BOOST HIGH PROTEIN) LIQD Take 1 Container by mouth 2 (two) times daily between meals.    [provider]  morphine 10 MG/5ML solution Take 5 mLs (10 mg total) by mouth every 2 (two) hours as needed for moderate pain or severe pain. 09/24/19   Marguerita Merles Latif, DO  oxybutynin (DITROPAN) 5  MG tablet Take 5 mg by mouth 2 (two) times daily.    [provider]  polyethylene glycol (MIRALAX / GLYCOLAX) 17 g packet Take 17 g by mouth 2 (two) times daily. 09/24/19   Sheikh, Omair Latif, DO  senna-docusate (SENOKOT-S) 8.6-50 MG tablet Take 1 tablet by mouth at bedtime. 09/24/19   Marguerita Merles Latif, DO     Allergies Penicillins   Family  History  Problem Relation Age of Onset  . Cancer Other        multiple family members including all siblings    Social History Social History   Tobacco Use  . Smoking status: Never Smoker  . Smokeless tobacco: Never Used  Substance Use Topics  . Alcohol use: No    Review of Systems Level 5 Caveat: Portions of the History and Physical including HPI and review of systems are unable to be completely obtained due to patient being a poor historian   Constitutional:   No known fever.  ENT:   No rhinorrhea. Cardiovascular:   No chest pain or syncope. Respiratory:   No dyspnea or cough. Gastrointestinal:   Negative for abdominal pain, vomiting and diarrhea.  Musculoskeletal:   Negative for focal pain or swelling ____________________________________________   PHYSICAL EXAM:  VITAL SIGNS: ED Triage Vitals  Enc Vitals Group     BP 01/19/2020 1450 (!) 44/32     Pulse Rate Jan 19, 2020 1450 97     Resp 19-Jan-2020 1450 (!) 32     Temp 2020/01/19 1450 (!) 97.4 F (36.3 C)     Temp Source 01/19/20 1450 Axillary     SpO2 2020/01/19 1450 100 %     Weight 01/19/2020 1454 120 lb 9.5 oz (54.7 kg)     Height Jan 19, 2020 1454 5\' 10"  (1.778 m)     Head Circumference --      Peak Flow --      Pain Score 01-19-2020 1452 0     Pain Loc --      Pain Edu? --      Excl. in GC? --     Vital signs reviewed, nursing assessments reviewed.   Constitutional: Comatose. [place]ussmaul respirations. Eyes:   Conjunctivae are normal. PERRL. ENT      Head:   Normocephalic and atraumatic.      Nose:   No congestion/rhinnorhea.       Mouth/Throat:   Dry mucous membranes       Neck:   No meningismus. Full ROM. Hematological/Lymphatic/Immunilogical:   No cervical lymphadenopathy. Cardiovascular:   Tachycardia heart rate 130. Symmetric thready bilateral radial pulses.  There is palpable left femoral and left popliteal pulse.  Right femoral-popliteal and DP pulses nonpalpable, and right leg is pale and cool.01/10/20 Respiratory:    Tachypnea.  Bilateral basilar crackles. Gastrointestinal:   Soft and nontender. Non distended.  No rebound, rigidity, or guarding.  Musculoskeletal:   No deformities or focal tenderness Neurologic:   Comatose.  GCS 3.  Skin:   Right lower extremity is pale and cool.  Otherwise, skin is warm, dry and intact. No rash noted.  No petechiae, purpura, or bullae.  ____________________________________________    LABS (pertinent positives/negatives) (all labs ordered are listed, but only abnormal results are displayed) Labs Reviewed  CBC WITH DIFFERENTIAL/PLATELET - Abnormal; Notable for the following components:      Result Value   WBC 17.5 (*)    RBC 5.67 (*)    Hemoglobin 16.6 (*)    HCT 55.0 (*)  Neutro Abs 14.6 (*)    Monocytes Absolute 1.3 (*)    Abs Immature Granulocytes 0.14 (*)    All other components within normal limits  COMPREHENSIVE METABOLIC PANEL  LIPASE, BLOOD  BETA-HYDROXYBUTYRIC ACID   ____________________________________________   EKG  Interpreted by me Sinus rhythm rate of 98, left axis, normal intervals.  Poor R wave progression.  Normal ST segments and T waves.  ____________________________________________    RADIOLOGY  No results found.  ____________________________________________   PROCEDURES Procedures  ____________________________________________  DIFFERENTIAL DIAGNOSIS   DKA, septic shock, UTI, pneumonia, heart failure, ischemic limb, ACS, pulmonary embolism  CLINICAL IMPRESSION / ASSESSMENT AND PLAN / ED COURSE  Medications ordered in the ED: Medications  sodium chloride 0.9 % bolus 1,000 mL (1,000 mLs Intravenous New Bag/Given January 24, 2020 1456)    Pertinent labs & imaging results that were available during my care of the patient were reviewed by me and considered in my medical decision making (see chart for details).   Jenna Robinson was evaluated in Emergency Department on 01/24/20 for the symptoms described in the history  of present illness. She was evaluated in the context of the global COVID-19 pandemic, which necessitated consideration that the patient might be at risk for infection with the SARS-CoV-2 virus that causes COVID-19. Institutional protocols and algorithms that pertain to the evaluation of patients at risk for COVID-19 are in a state of rapid change based on information released by regulatory bodies including the CDC and federal and state organizations. These policies and algorithms were followed during the patient's care in the ED.   Patient presents in extremis with tachycardia, tachypnea, hypoxia severe hypotension, requiring nonrebreather oxygen.  Will need to start sepsis work-up, give empiric antibiotics with meropenem and vancomycin, aggressive IV fluid resuscitation.  She is DNR, altered mental status, not a candidate for any advanced airway support beyond nonrebreather at this time.  Clinical Course as of 01/24/20 1551  Mon 01/24/20  1500 Care discussed with patient's self identified healthcare power of attorney, the patient's nieces son, who is at bedside.  She is DNR with documentation accompanying her from her skilled nursing facility.  He notes that she has been declining for a long period of time, has no meaningful quality of life, recurrent illnesses, and would not want to live this way.  She presents in extremis, suspected DKA or septic shock.  He wishes to proceed with comfort care only, no further interventions and allow for natural death. [PS]    Clinical Course User Index [PS] Sharman Cheek, MD    ----------------------------------------- 3:57 PM on 01-24-20 -----------------------------------------  Antibiotics discontinued, will discontinue uncollected lab specimens, avoid any invasive or painful procedures.  Family understands patient is in dying process.  Expectant management for imminent death.   ____________________________________________   FINAL CLINICAL  IMPRESSION(S) / ED DIAGNOSES    Final diagnoses:  Shock (HCC)  Type 2 diabetes mellitus with hyperglycemia, with long-term current use of insulin (HCC)  Glasgow coma scale total score 3-8, unspecified coma timing [facility])     ED Discharge Orders    None      Portions of this note were generated with dragon dictation software. Dictation errors may occur despite best attempts at proofreading.   Sharman Cheek, MD 01/24/2020 [phone]

## 2020-01-13 NOTE — ED Notes (Addendum)
Pt with agonal breathing and faint pulses. Per MD Smithto leave monitor on standby no end rhythm strip needed. Frequent checks on this pt.

## 2020-01-13 NOTE — ED Notes (Signed)
Pt arrived via EMS unresponsive, HR now in 130's, denoting a-fib on monitor. unable to obtain oxygen saturation.  Breathing is agonal.  Family, POA at bedside speaking with provider, states no extraordinary measures to be taken.  DNR paperwork at bedside.  Pt family wishes to let patient pass in peace.

## 2020-01-13 NOTE — Progress Notes (Signed)
PHARMACY -  BRIEF ANTIBIOTIC NOTE   Pharmacy has received consult(s) for Vancomycin from an ED provider.  The patient's profile has been reviewed for ht/wt/allergies/indication/available labs.    One time order(s) placed for Vancomycin 1g   Further antibiotics/pharmacy consults should be ordered by admitting physician if indicated.                       Thank you, Gardner Candle, PharmD, BCPS Clinical Pharmacist 2020-01-16 3:11 PM

## 2020-01-13 DEATH — deceased

## 2020-12-24 IMAGING — CT CT CERVICAL SPINE W/O CM
3 of 4 series · 12 of 33 positions shown, 14 images · non-contrast
Comparison: 09/12/2019

CLINICAL DATA: Right-sided neck pain after unwitnessed fall earlier
today.

EXAM:
CT CERVICAL SPINE WITHOUT CONTRAST
TECHNIQUE: Multidetector CT imaging of the cervical spine was performed without
intravenous contrast. Multiplanar CT image reconstructions were also
generated.

[Series 4: coronal bone · coronal · 0.23mm/px · 3 of 63 slices shown]
[im 13/63  bone]
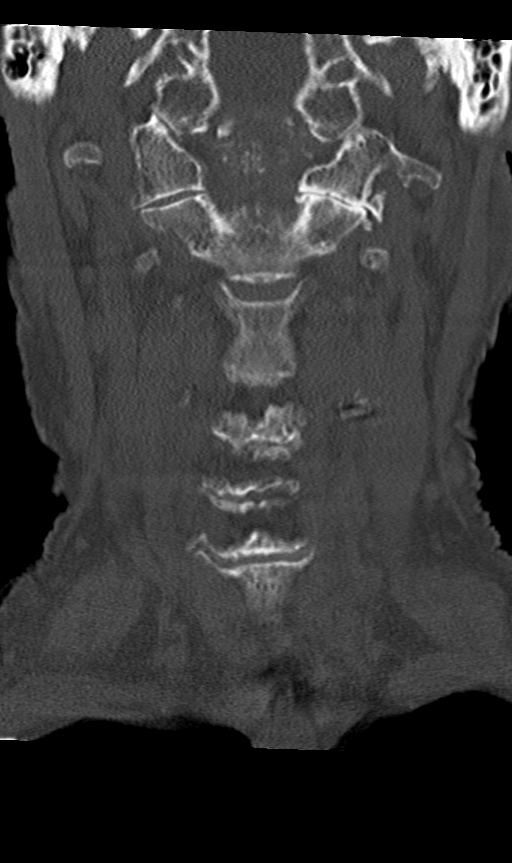
[im 25/63  bone]
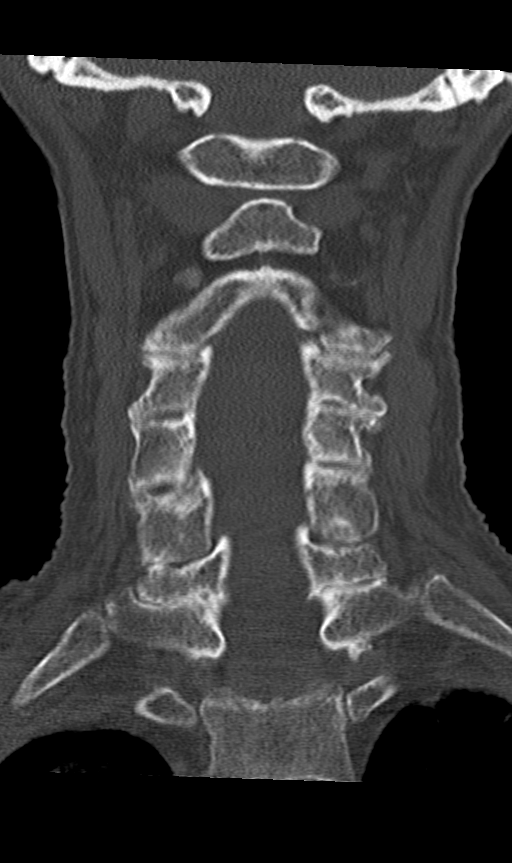
[im 38/63  bone]
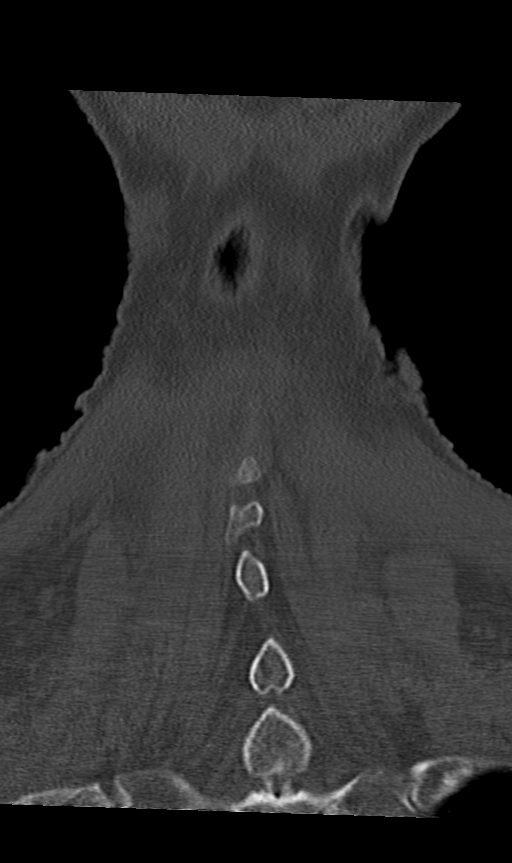

[Series 5: sagittal bone · sagittal · 0.27mm/px · 5 of 64 slices shown, 6 images]
[im 22/64  bone]
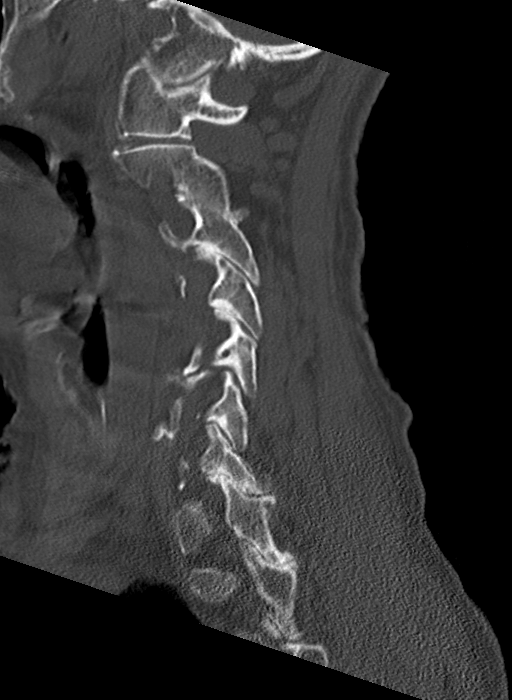
[im 27/64  bone]
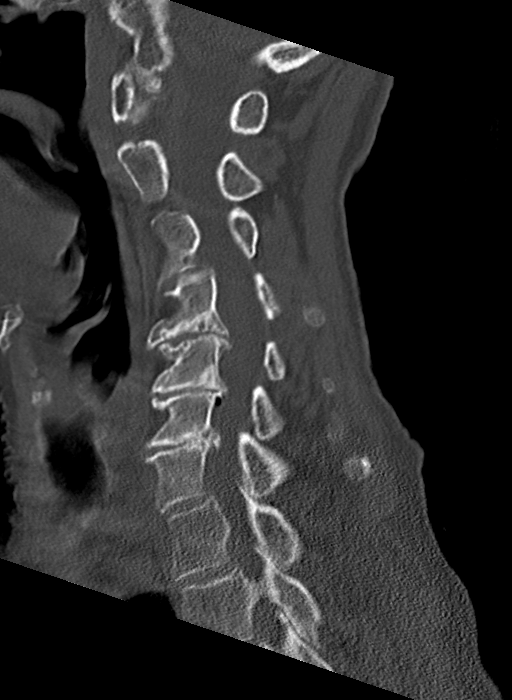
[im 32/64  soft-tissue]
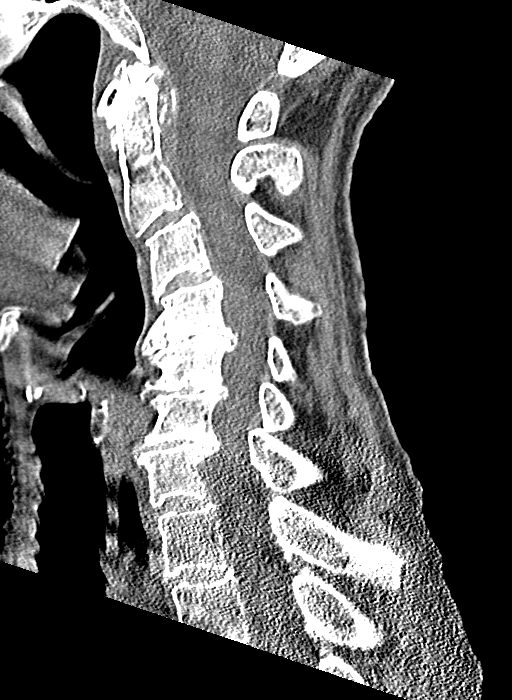
[im 32/64  bone]
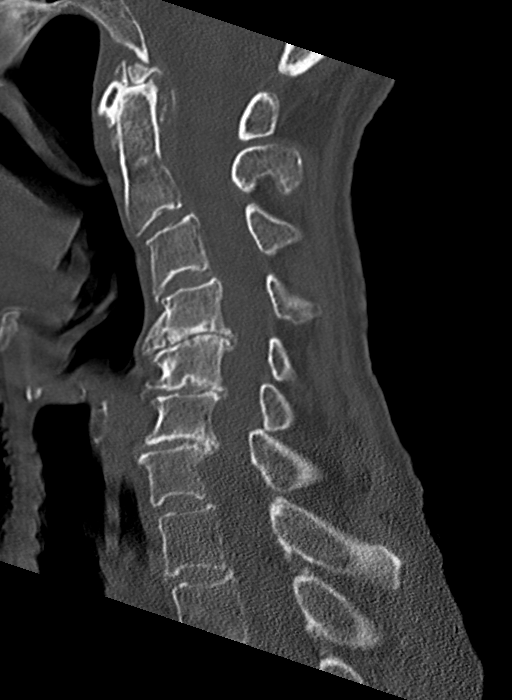
[im 37/64  bone]
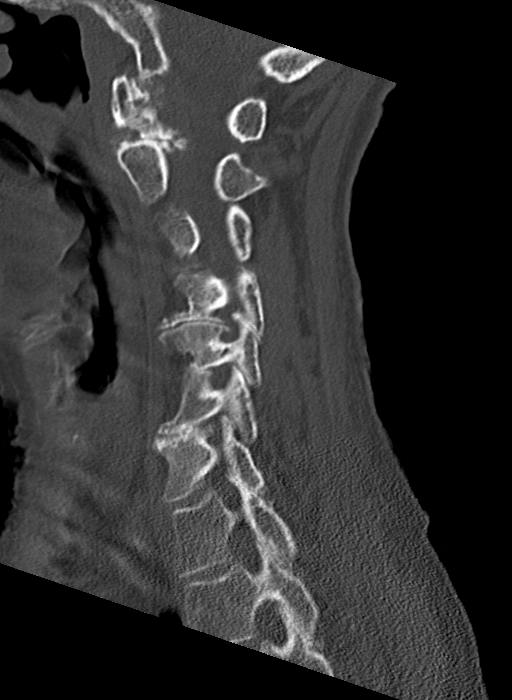
[im 43/64  bone]
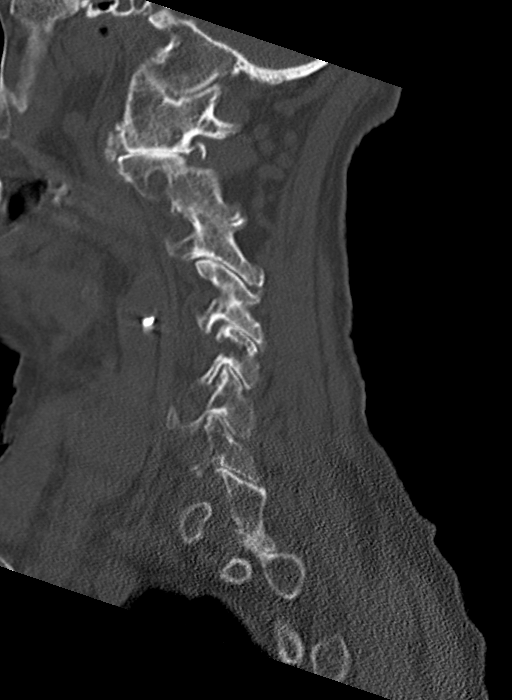

[Series 6: orthogonal bone · axial · 0.22mm/px · z∈[-264,-133]mm · 4 of 101 slices shown, 5 images]
[im 15/101  soft-tissue]
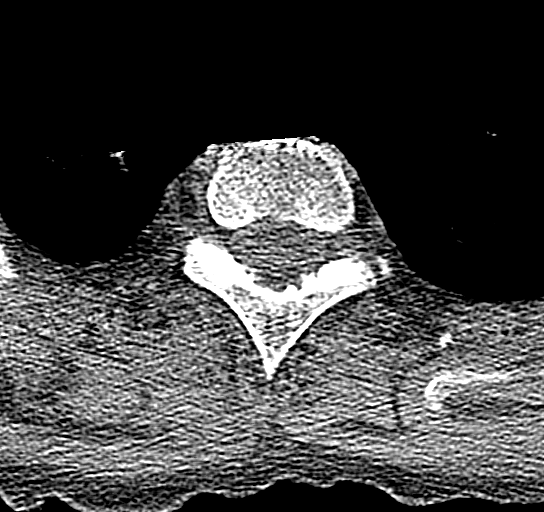
[im 15/101  bone]
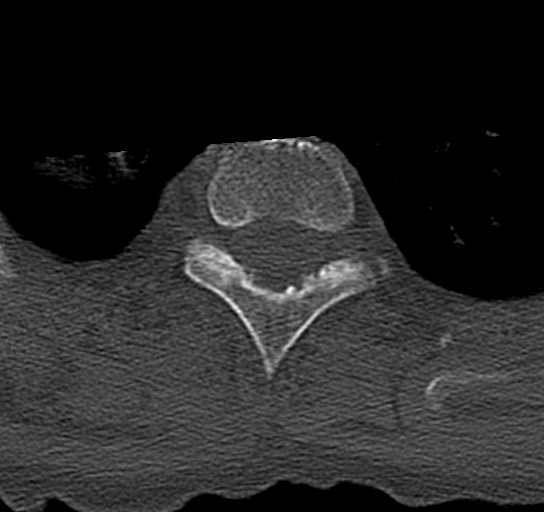
[im 43/101  bone]
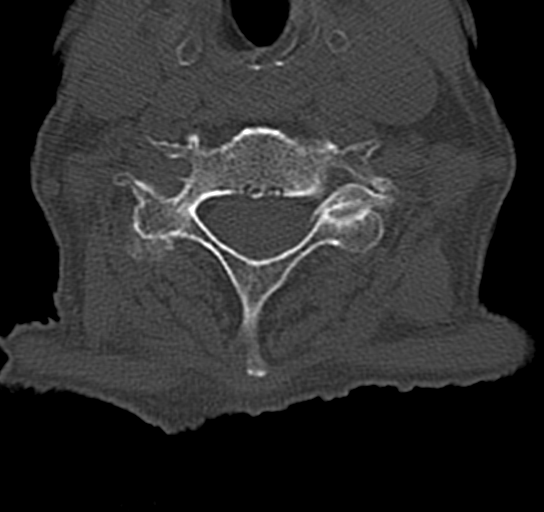
[im 58/101  bone]
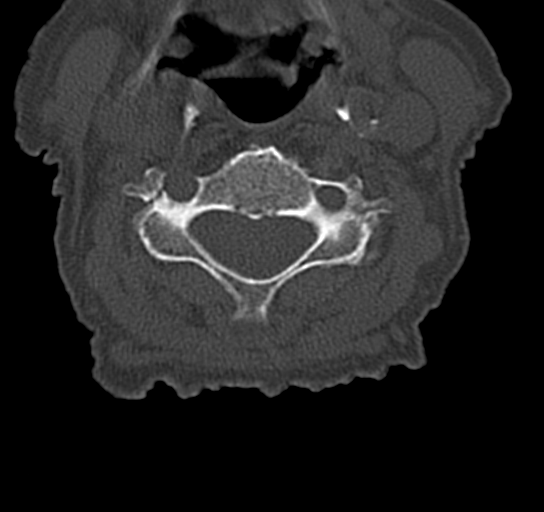
[im 86/101  bone]
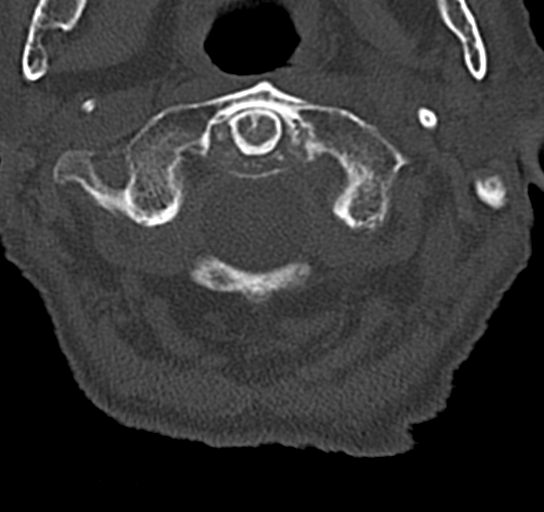

[12 of 33 positions shown; findings below may reference images not displayed]

FINDINGS: Alignment: There is reversal of the usual cervical lordosis with
slight anterior subluxation of C3 on C4. This appearance is
unchanged since the previous study and likely represents
degenerative change. C1-2 articulation appears intact.

Skull base and vertebrae: No vertebral compression deformities. No
focal bone lesion or bone destruction. Bone cortex appears intact.
Posterior elements and skull base appear intact.

Soft tissues and spinal canal: No prevertebral soft tissue swelling.
No abnormal paraspinal soft tissue mass or infiltration.

Disc levels: Degenerative changes throughout the cervical spine with
narrowed interspaces and endplate hypertrophic change. Prominent
degenerative changes in the posterior elements. Ligamentous
calcification at C1-2.

Upper chest: A small right apical pneumothorax is again
demonstrated, possibly increased since the previous study.

Other: None.
IMPRESSION: 1. Degenerative changes throughout the cervical spine. Reversal of
the usual cervical lordosis with slight anterior subluxation of C3
on C4. No acute displaced fractures identified.
2. Small right apical pneumothorax is again demonstrated, possibly
increased since previous study.

## 2020-12-24 IMAGING — CR DG CHEST 1V PORT
1 series · 1 of 1 positions shown · non-contrast
Comparison: 09/14/2019

CLINICAL DATA: Chest pain after fall

EXAM:
PORTABLE CHEST 1 VIEW

[dg chest port 1 view]
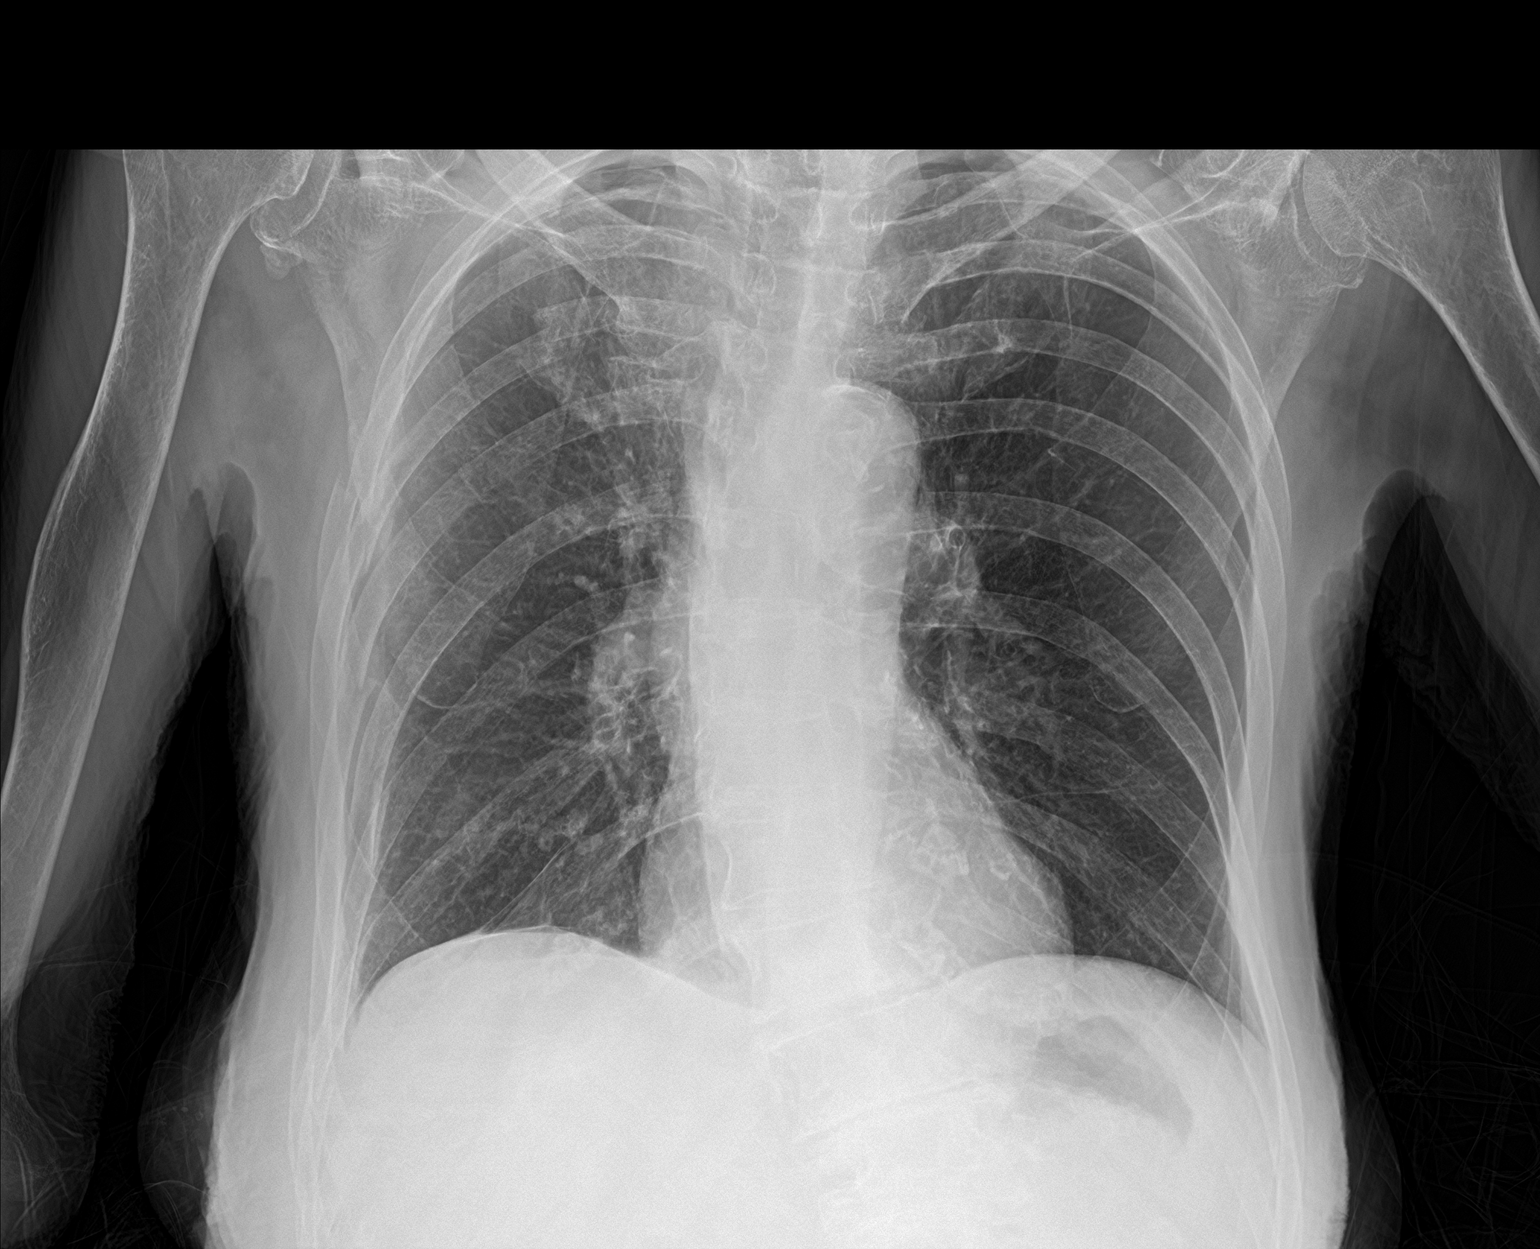

[1 of 1 positions shown; findings below may reference images not displayed]

FINDINGS: Single frontal view of the chest demonstrates a stable cardiac
silhouette. No airspace disease or effusion. The trace right apical
pneumothorax seen on prior examination is unchanged. Multiple
displaced right posterolateral rib fractures are again noted,
stable. Resolution of the subcutaneous gas seen previously. No new
bony abnormalities.
IMPRESSION: 1. Stable right posterolateral rib fractures and trace right apical
pneumothorax. No new process.

## 2020-12-26 IMAGING — CR DG CHEST 1V
1 series · 1 of 1 positions shown · non-contrast
Comparison: 09/19/2019

CLINICAL DATA: Follow-up pneumothorax

EXAM:
CHEST  1 VIEW

[chest ap]
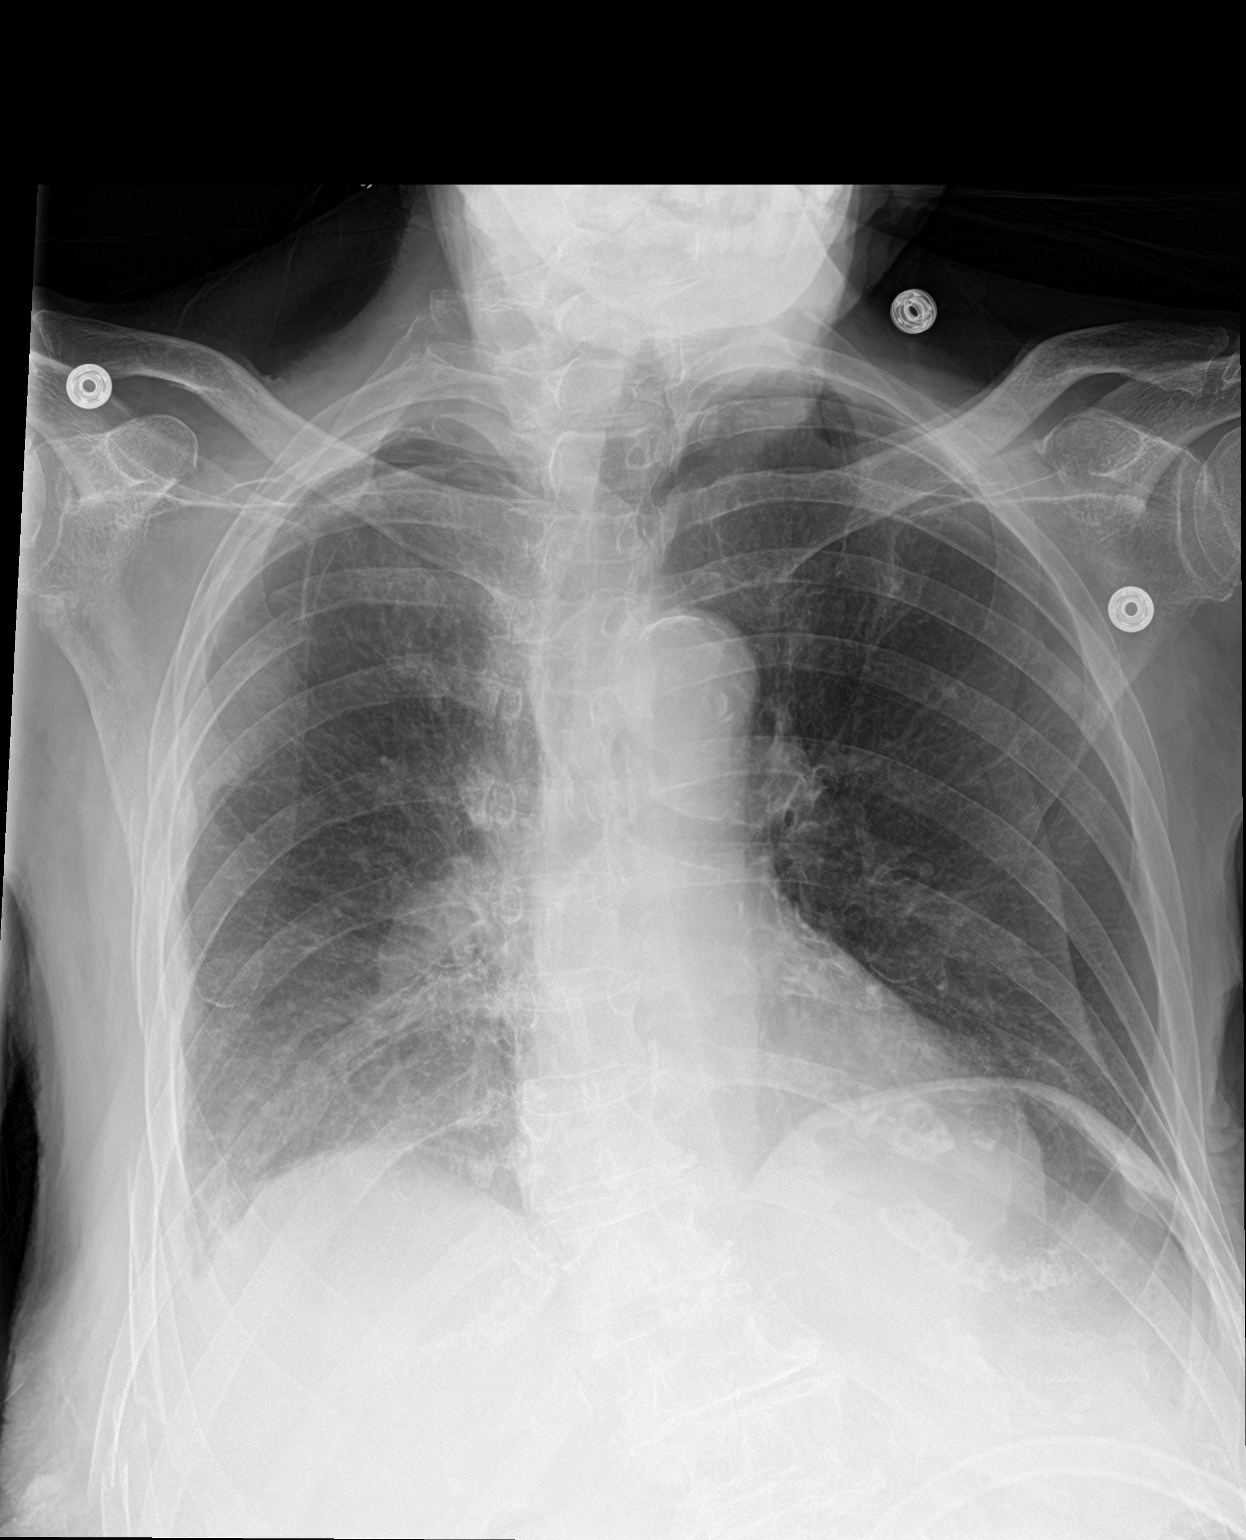

[1 of 1 positions shown; findings below may reference images not displayed]

FINDINGS: Cardiac shadow is stable. Aortic calcifications are again seen.
Multiple right-sided rib fractures are again identified and stable.
Minimal right apical pneumothorax is noted stable from the prior
exam given some change in the patient positioning. Small right-sided
pleural effusion is noted. No focal infiltrate is seen. No other
focal abnormality is noted.
IMPRESSION: Stable right rib fractures with small right apical pneumothorax.
